# Patient Record
Sex: Male | Born: 1945 | Race: White | Hispanic: No | Smoking: Never smoker
Health system: Southern US, Community
[De-identification: ages and names within clinical notes are randomized; demographics above are authoritative.]

## PROBLEM LIST (undated history)

## (undated) DIAGNOSIS — R911 Solitary pulmonary nodule: Secondary | ICD-10-CM

## (undated) DIAGNOSIS — M7660 Achilles tendinitis, unspecified leg: Secondary | ICD-10-CM

## (undated) DIAGNOSIS — E559 Vitamin D deficiency, unspecified: Secondary | ICD-10-CM

## (undated) DIAGNOSIS — J45909 Unspecified asthma, uncomplicated: Secondary | ICD-10-CM

## (undated) DIAGNOSIS — C61 Malignant neoplasm of prostate: Secondary | ICD-10-CM

## (undated) DIAGNOSIS — G3184 Mild cognitive impairment, so stated: Secondary | ICD-10-CM

## (undated) DIAGNOSIS — K219 Gastro-esophageal reflux disease without esophagitis: Secondary | ICD-10-CM

## (undated) DIAGNOSIS — K5901 Slow transit constipation: Secondary | ICD-10-CM

## (undated) DIAGNOSIS — G4700 Insomnia, unspecified: Secondary | ICD-10-CM

## (undated) DIAGNOSIS — E785 Hyperlipidemia, unspecified: Secondary | ICD-10-CM

## (undated) DIAGNOSIS — G4730 Sleep apnea, unspecified: Secondary | ICD-10-CM

## (undated) DIAGNOSIS — E119 Type 2 diabetes mellitus without complications: Secondary | ICD-10-CM

## (undated) DIAGNOSIS — E663 Overweight: Secondary | ICD-10-CM

## (undated) DIAGNOSIS — M5030 Other cervical disc degeneration, unspecified cervical region: Secondary | ICD-10-CM

## (undated) DIAGNOSIS — K8020 Calculus of gallbladder without cholecystitis without obstruction: Secondary | ICD-10-CM

## (undated) DIAGNOSIS — M6528 Calcific tendinitis, other site: Secondary | ICD-10-CM

## (undated) HISTORY — DX: Unspecified asthma, uncomplicated: J45.909

## (undated) HISTORY — DX: Malignant neoplasm of prostate: C61

## (undated) HISTORY — DX: Gastro-esophageal reflux disease without esophagitis: K21.9

## (undated) HISTORY — PX: WISDOM TOOTH EXTRACTION: SHX21

## (undated) HISTORY — DX: Other cervical disc degeneration, unspecified cervical region: M50.30

## (undated) HISTORY — DX: Sleep apnea, unspecified: G47.30

## (undated) HISTORY — DX: Insomnia, unspecified: G47.00

## (undated) HISTORY — DX: Mild cognitive impairment of uncertain or unknown etiology: G31.84

## (undated) HISTORY — DX: Calcific tendinitis, other site: M65.28

## (undated) HISTORY — PX: CERVICAL FUSION: SHX112

## (undated) HISTORY — DX: Achilles tendinitis, unspecified leg: M76.60

## (undated) HISTORY — DX: Solitary pulmonary nodule: R91.1

## (undated) HISTORY — DX: Hyperlipidemia, unspecified: E78.5

## (undated) HISTORY — DX: Overweight: E66.3

## (undated) HISTORY — DX: Type 2 diabetes mellitus without complications: E11.9

## (undated) HISTORY — DX: Slow transit constipation: K59.01

## (undated) HISTORY — DX: Calculus of gallbladder without cholecystitis without obstruction: K80.20

## (undated) HISTORY — DX: Vitamin D deficiency, unspecified: E55.9

## (undated) HISTORY — PX: TONSILLECTOMY: SHX5217

---

## 1993-05-29 HISTORY — PX: OTHER SURGICAL HISTORY: SHX169

## 1995-05-30 HISTORY — PX: EYE SURGERY: SHX253

## 2004-06-29 DIAGNOSIS — R911 Solitary pulmonary nodule: Secondary | ICD-10-CM

## 2004-06-29 HISTORY — DX: Solitary pulmonary nodule: R91.1

## 2006-05-29 LAB — HM COLONOSCOPY

## 2009-10-04 DIAGNOSIS — E8881 Metabolic syndrome: Secondary | ICD-10-CM | POA: Insufficient documentation

## 2010-05-29 HISTORY — PX: CHOLECYSTECTOMY: SHX55

## 2011-09-11 DIAGNOSIS — K8020 Calculus of gallbladder without cholecystitis without obstruction: Secondary | ICD-10-CM

## 2011-09-11 HISTORY — DX: Calculus of gallbladder without cholecystitis without obstruction: K80.20

## 2011-09-11 HISTORY — PX: PROSTATE SURGERY: SHX751

## 2015-05-30 LAB — HM COLONOSCOPY

## 2015-06-16 DIAGNOSIS — C61 Malignant neoplasm of prostate: Secondary | ICD-10-CM | POA: Diagnosis not present

## 2015-06-22 DIAGNOSIS — N528 Other male erectile dysfunction: Secondary | ICD-10-CM | POA: Diagnosis not present

## 2015-06-22 DIAGNOSIS — C61 Malignant neoplasm of prostate: Secondary | ICD-10-CM | POA: Diagnosis not present

## 2015-07-01 DIAGNOSIS — M47812 Spondylosis without myelopathy or radiculopathy, cervical region: Secondary | ICD-10-CM | POA: Diagnosis not present

## 2015-07-20 DIAGNOSIS — M4312 Spondylolisthesis, cervical region: Secondary | ICD-10-CM | POA: Diagnosis not present

## 2015-07-20 DIAGNOSIS — M4802 Spinal stenosis, cervical region: Secondary | ICD-10-CM | POA: Diagnosis not present

## 2015-07-20 DIAGNOSIS — M47812 Spondylosis without myelopathy or radiculopathy, cervical region: Secondary | ICD-10-CM | POA: Diagnosis not present

## 2015-07-20 DIAGNOSIS — M5021 Other cervical disc displacement,  high cervical region: Secondary | ICD-10-CM | POA: Diagnosis not present

## 2015-07-22 DIAGNOSIS — M4802 Spinal stenosis, cervical region: Secondary | ICD-10-CM | POA: Diagnosis not present

## 2015-09-23 DIAGNOSIS — M9902 Segmental and somatic dysfunction of thoracic region: Secondary | ICD-10-CM | POA: Diagnosis not present

## 2015-09-23 DIAGNOSIS — M9903 Segmental and somatic dysfunction of lumbar region: Secondary | ICD-10-CM | POA: Diagnosis not present

## 2015-09-23 DIAGNOSIS — M9901 Segmental and somatic dysfunction of cervical region: Secondary | ICD-10-CM | POA: Diagnosis not present

## 2015-10-04 DIAGNOSIS — E559 Vitamin D deficiency, unspecified: Secondary | ICD-10-CM | POA: Diagnosis not present

## 2015-10-04 DIAGNOSIS — Z8546 Personal history of malignant neoplasm of prostate: Secondary | ICD-10-CM | POA: Diagnosis not present

## 2015-10-04 DIAGNOSIS — E119 Type 2 diabetes mellitus without complications: Secondary | ICD-10-CM | POA: Diagnosis not present

## 2015-10-11 DIAGNOSIS — Z8619 Personal history of other infectious and parasitic diseases: Secondary | ICD-10-CM | POA: Diagnosis not present

## 2015-10-18 DIAGNOSIS — E119 Type 2 diabetes mellitus without complications: Secondary | ICD-10-CM | POA: Diagnosis not present

## 2015-10-18 DIAGNOSIS — E785 Hyperlipidemia, unspecified: Secondary | ICD-10-CM | POA: Diagnosis not present

## 2015-10-18 DIAGNOSIS — Z0000 Encounter for general adult medical examination without abnormal findings: Secondary | ICD-10-CM | POA: Diagnosis not present

## 2015-10-18 DIAGNOSIS — E559 Vitamin D deficiency, unspecified: Secondary | ICD-10-CM | POA: Diagnosis not present

## 2015-10-21 DIAGNOSIS — M9903 Segmental and somatic dysfunction of lumbar region: Secondary | ICD-10-CM | POA: Diagnosis not present

## 2015-10-21 DIAGNOSIS — M9901 Segmental and somatic dysfunction of cervical region: Secondary | ICD-10-CM | POA: Diagnosis not present

## 2015-10-21 DIAGNOSIS — M9902 Segmental and somatic dysfunction of thoracic region: Secondary | ICD-10-CM | POA: Diagnosis not present

## 2015-10-26 DIAGNOSIS — E119 Type 2 diabetes mellitus without complications: Secondary | ICD-10-CM | POA: Diagnosis not present

## 2015-10-26 DIAGNOSIS — H2513 Age-related nuclear cataract, bilateral: Secondary | ICD-10-CM | POA: Diagnosis not present

## 2015-11-11 DIAGNOSIS — M9903 Segmental and somatic dysfunction of lumbar region: Secondary | ICD-10-CM | POA: Diagnosis not present

## 2015-11-11 DIAGNOSIS — M9901 Segmental and somatic dysfunction of cervical region: Secondary | ICD-10-CM | POA: Diagnosis not present

## 2015-11-11 DIAGNOSIS — M9902 Segmental and somatic dysfunction of thoracic region: Secondary | ICD-10-CM | POA: Diagnosis not present

## 2015-11-25 DIAGNOSIS — M9901 Segmental and somatic dysfunction of cervical region: Secondary | ICD-10-CM | POA: Diagnosis not present

## 2015-11-25 DIAGNOSIS — M9902 Segmental and somatic dysfunction of thoracic region: Secondary | ICD-10-CM | POA: Diagnosis not present

## 2015-11-25 DIAGNOSIS — M9903 Segmental and somatic dysfunction of lumbar region: Secondary | ICD-10-CM | POA: Diagnosis not present

## 2015-12-14 DIAGNOSIS — M4802 Spinal stenosis, cervical region: Secondary | ICD-10-CM | POA: Diagnosis not present

## 2015-12-16 DIAGNOSIS — M4802 Spinal stenosis, cervical region: Secondary | ICD-10-CM | POA: Diagnosis not present

## 2015-12-17 DIAGNOSIS — Z86010 Personal history of colonic polyps: Secondary | ICD-10-CM | POA: Diagnosis not present

## 2015-12-17 DIAGNOSIS — Z01818 Encounter for other preprocedural examination: Secondary | ICD-10-CM | POA: Diagnosis not present

## 2015-12-17 DIAGNOSIS — G4730 Sleep apnea, unspecified: Secondary | ICD-10-CM | POA: Diagnosis not present

## 2015-12-17 DIAGNOSIS — Z8546 Personal history of malignant neoplasm of prostate: Secondary | ICD-10-CM | POA: Diagnosis not present

## 2015-12-17 DIAGNOSIS — J45909 Unspecified asthma, uncomplicated: Secondary | ICD-10-CM | POA: Diagnosis not present

## 2015-12-21 DIAGNOSIS — M4802 Spinal stenosis, cervical region: Secondary | ICD-10-CM | POA: Diagnosis not present

## 2015-12-22 DIAGNOSIS — C61 Malignant neoplasm of prostate: Secondary | ICD-10-CM | POA: Diagnosis not present

## 2015-12-24 DIAGNOSIS — M4802 Spinal stenosis, cervical region: Secondary | ICD-10-CM | POA: Diagnosis not present

## 2015-12-28 HISTORY — PX: COLONOSCOPY: SHX174

## 2016-01-03 DIAGNOSIS — C61 Malignant neoplasm of prostate: Secondary | ICD-10-CM | POA: Diagnosis not present

## 2016-01-06 DIAGNOSIS — M9901 Segmental and somatic dysfunction of cervical region: Secondary | ICD-10-CM | POA: Diagnosis not present

## 2016-01-06 DIAGNOSIS — M9902 Segmental and somatic dysfunction of thoracic region: Secondary | ICD-10-CM | POA: Diagnosis not present

## 2016-01-06 DIAGNOSIS — M9903 Segmental and somatic dysfunction of lumbar region: Secondary | ICD-10-CM | POA: Diagnosis not present

## 2016-01-18 DIAGNOSIS — Z1211 Encounter for screening for malignant neoplasm of colon: Secondary | ICD-10-CM | POA: Diagnosis not present

## 2016-01-18 DIAGNOSIS — Z86010 Personal history of colonic polyps: Secondary | ICD-10-CM | POA: Diagnosis not present

## 2016-01-27 DIAGNOSIS — M9903 Segmental and somatic dysfunction of lumbar region: Secondary | ICD-10-CM | POA: Diagnosis not present

## 2016-01-27 DIAGNOSIS — M9901 Segmental and somatic dysfunction of cervical region: Secondary | ICD-10-CM | POA: Diagnosis not present

## 2016-01-27 DIAGNOSIS — M9902 Segmental and somatic dysfunction of thoracic region: Secondary | ICD-10-CM | POA: Diagnosis not present

## 2016-02-17 DIAGNOSIS — M9902 Segmental and somatic dysfunction of thoracic region: Secondary | ICD-10-CM | POA: Diagnosis not present

## 2016-02-17 DIAGNOSIS — M9901 Segmental and somatic dysfunction of cervical region: Secondary | ICD-10-CM | POA: Diagnosis not present

## 2016-02-17 DIAGNOSIS — M9903 Segmental and somatic dysfunction of lumbar region: Secondary | ICD-10-CM | POA: Diagnosis not present

## 2016-02-21 DIAGNOSIS — G4733 Obstructive sleep apnea (adult) (pediatric): Secondary | ICD-10-CM | POA: Diagnosis not present

## 2016-02-23 DIAGNOSIS — E162 Hypoglycemia, unspecified: Secondary | ICD-10-CM | POA: Diagnosis not present

## 2016-02-23 DIAGNOSIS — E119 Type 2 diabetes mellitus without complications: Secondary | ICD-10-CM | POA: Diagnosis not present

## 2016-02-23 DIAGNOSIS — H8113 Benign paroxysmal vertigo, bilateral: Secondary | ICD-10-CM | POA: Diagnosis not present

## 2016-03-02 DIAGNOSIS — Z23 Encounter for immunization: Secondary | ICD-10-CM | POA: Diagnosis not present

## 2016-03-09 DIAGNOSIS — M9901 Segmental and somatic dysfunction of cervical region: Secondary | ICD-10-CM | POA: Diagnosis not present

## 2016-03-09 DIAGNOSIS — M9902 Segmental and somatic dysfunction of thoracic region: Secondary | ICD-10-CM | POA: Diagnosis not present

## 2016-03-09 DIAGNOSIS — M9903 Segmental and somatic dysfunction of lumbar region: Secondary | ICD-10-CM | POA: Diagnosis not present

## 2016-03-22 DIAGNOSIS — J45909 Unspecified asthma, uncomplicated: Secondary | ICD-10-CM | POA: Diagnosis not present

## 2016-03-22 DIAGNOSIS — R05 Cough: Secondary | ICD-10-CM | POA: Diagnosis not present

## 2016-04-06 DIAGNOSIS — M9902 Segmental and somatic dysfunction of thoracic region: Secondary | ICD-10-CM | POA: Diagnosis not present

## 2016-04-06 DIAGNOSIS — M9903 Segmental and somatic dysfunction of lumbar region: Secondary | ICD-10-CM | POA: Diagnosis not present

## 2016-04-06 DIAGNOSIS — M9901 Segmental and somatic dysfunction of cervical region: Secondary | ICD-10-CM | POA: Diagnosis not present

## 2016-04-11 DIAGNOSIS — E119 Type 2 diabetes mellitus without complications: Secondary | ICD-10-CM | POA: Diagnosis not present

## 2016-04-11 DIAGNOSIS — E782 Mixed hyperlipidemia: Secondary | ICD-10-CM | POA: Diagnosis not present

## 2016-04-26 DIAGNOSIS — E782 Mixed hyperlipidemia: Secondary | ICD-10-CM | POA: Diagnosis not present

## 2016-04-26 DIAGNOSIS — J45909 Unspecified asthma, uncomplicated: Secondary | ICD-10-CM | POA: Diagnosis not present

## 2016-04-26 DIAGNOSIS — K219 Gastro-esophageal reflux disease without esophagitis: Secondary | ICD-10-CM | POA: Diagnosis not present

## 2016-04-26 DIAGNOSIS — E119 Type 2 diabetes mellitus without complications: Secondary | ICD-10-CM | POA: Diagnosis not present

## 2016-05-01 DIAGNOSIS — E873 Alkalosis: Secondary | ICD-10-CM | POA: Diagnosis not present

## 2016-05-01 DIAGNOSIS — J189 Pneumonia, unspecified organism: Secondary | ICD-10-CM | POA: Diagnosis not present

## 2016-05-01 DIAGNOSIS — R509 Fever, unspecified: Secondary | ICD-10-CM | POA: Diagnosis not present

## 2016-05-01 DIAGNOSIS — Z7984 Long term (current) use of oral hypoglycemic drugs: Secondary | ICD-10-CM | POA: Diagnosis not present

## 2016-05-01 DIAGNOSIS — E119 Type 2 diabetes mellitus without complications: Secondary | ICD-10-CM | POA: Diagnosis not present

## 2016-05-01 DIAGNOSIS — J440 Chronic obstructive pulmonary disease with acute lower respiratory infection: Secondary | ICD-10-CM | POA: Diagnosis not present

## 2016-05-09 DIAGNOSIS — J189 Pneumonia, unspecified organism: Secondary | ICD-10-CM | POA: Diagnosis not present

## 2016-05-25 DIAGNOSIS — M9902 Segmental and somatic dysfunction of thoracic region: Secondary | ICD-10-CM | POA: Diagnosis not present

## 2016-05-25 DIAGNOSIS — M9901 Segmental and somatic dysfunction of cervical region: Secondary | ICD-10-CM | POA: Diagnosis not present

## 2016-05-25 DIAGNOSIS — M9903 Segmental and somatic dysfunction of lumbar region: Secondary | ICD-10-CM | POA: Diagnosis not present

## 2016-07-06 DIAGNOSIS — M9903 Segmental and somatic dysfunction of lumbar region: Secondary | ICD-10-CM | POA: Diagnosis not present

## 2016-07-06 DIAGNOSIS — M9901 Segmental and somatic dysfunction of cervical region: Secondary | ICD-10-CM | POA: Diagnosis not present

## 2016-07-06 DIAGNOSIS — M9902 Segmental and somatic dysfunction of thoracic region: Secondary | ICD-10-CM | POA: Diagnosis not present

## 2016-07-27 DIAGNOSIS — M9902 Segmental and somatic dysfunction of thoracic region: Secondary | ICD-10-CM | POA: Diagnosis not present

## 2016-07-27 DIAGNOSIS — M9901 Segmental and somatic dysfunction of cervical region: Secondary | ICD-10-CM | POA: Diagnosis not present

## 2016-07-27 DIAGNOSIS — M9903 Segmental and somatic dysfunction of lumbar region: Secondary | ICD-10-CM | POA: Diagnosis not present

## 2016-08-18 DIAGNOSIS — R0602 Shortness of breath: Secondary | ICD-10-CM | POA: Diagnosis not present

## 2016-08-18 DIAGNOSIS — J189 Pneumonia, unspecified organism: Secondary | ICD-10-CM | POA: Diagnosis not present

## 2016-08-21 DIAGNOSIS — J45909 Unspecified asthma, uncomplicated: Secondary | ICD-10-CM | POA: Diagnosis not present

## 2016-08-21 DIAGNOSIS — J189 Pneumonia, unspecified organism: Secondary | ICD-10-CM | POA: Diagnosis not present

## 2016-10-27 LAB — HM DIABETES EYE EXAM

## 2016-11-07 DIAGNOSIS — E119 Type 2 diabetes mellitus without complications: Secondary | ICD-10-CM | POA: Diagnosis not present

## 2016-11-07 DIAGNOSIS — E782 Mixed hyperlipidemia: Secondary | ICD-10-CM | POA: Diagnosis not present

## 2016-11-07 DIAGNOSIS — E559 Vitamin D deficiency, unspecified: Secondary | ICD-10-CM | POA: Diagnosis not present

## 2016-11-07 DIAGNOSIS — Z0000 Encounter for general adult medical examination without abnormal findings: Secondary | ICD-10-CM | POA: Diagnosis not present

## 2016-11-07 LAB — HEPATIC FUNCTION PANEL
ALT: 20 (ref 10–40)
AST: 20 (ref 14–40)
Alkaline Phosphatase: 47 (ref 25–125)
BILIRUBIN, TOTAL: 0.7

## 2016-11-07 LAB — LIPID PANEL
Cholesterol: 148 (ref 0–200)
HDL: 84 — AB (ref 35–70)
LDL CALC: 70
TRIGLYCERIDES: 70 (ref 40–160)

## 2016-11-07 LAB — PSA: PSA: 0.769

## 2016-11-07 LAB — CBC AND DIFFERENTIAL
HCT: 45 (ref 41–53)
Hemoglobin: 14.6 (ref 13.5–17.5)
Neutrophils Absolute: 3
PLATELETS: 174 (ref 150–399)
WBC: 4.4

## 2016-11-07 LAB — VITAMIN D 25 HYDROXY (VIT D DEFICIENCY, FRACTURES): Vit D, 25-Hydroxy: 35.5

## 2016-11-07 LAB — BASIC METABOLIC PANEL
BUN: 16 (ref 4–21)
Creatinine: 1.1 (ref 0.6–1.3)
GLUCOSE: 143
Potassium: 4.6 (ref 3.4–5.3)
Sodium: 136 — AB (ref 137–147)

## 2016-11-07 LAB — TSH: TSH: 2.4 (ref 0.41–5.90)

## 2016-11-07 LAB — HEMOGLOBIN A1C: Hemoglobin A1C: 6.1

## 2016-11-16 DIAGNOSIS — M9901 Segmental and somatic dysfunction of cervical region: Secondary | ICD-10-CM | POA: Diagnosis not present

## 2016-11-16 DIAGNOSIS — M9903 Segmental and somatic dysfunction of lumbar region: Secondary | ICD-10-CM | POA: Diagnosis not present

## 2016-11-16 DIAGNOSIS — M9902 Segmental and somatic dysfunction of thoracic region: Secondary | ICD-10-CM | POA: Diagnosis not present

## 2016-11-27 DIAGNOSIS — E119 Type 2 diabetes mellitus without complications: Secondary | ICD-10-CM | POA: Diagnosis not present

## 2016-11-27 DIAGNOSIS — E559 Vitamin D deficiency, unspecified: Secondary | ICD-10-CM | POA: Diagnosis not present

## 2016-11-27 DIAGNOSIS — Z0000 Encounter for general adult medical examination without abnormal findings: Secondary | ICD-10-CM | POA: Diagnosis not present

## 2016-11-27 DIAGNOSIS — E782 Mixed hyperlipidemia: Secondary | ICD-10-CM | POA: Diagnosis not present

## 2016-12-07 DIAGNOSIS — M9901 Segmental and somatic dysfunction of cervical region: Secondary | ICD-10-CM | POA: Diagnosis not present

## 2016-12-07 DIAGNOSIS — M9902 Segmental and somatic dysfunction of thoracic region: Secondary | ICD-10-CM | POA: Diagnosis not present

## 2016-12-07 DIAGNOSIS — M9903 Segmental and somatic dysfunction of lumbar region: Secondary | ICD-10-CM | POA: Diagnosis not present

## 2016-12-26 DIAGNOSIS — M9901 Segmental and somatic dysfunction of cervical region: Secondary | ICD-10-CM | POA: Diagnosis not present

## 2016-12-26 DIAGNOSIS — M9903 Segmental and somatic dysfunction of lumbar region: Secondary | ICD-10-CM | POA: Diagnosis not present

## 2016-12-26 DIAGNOSIS — M9902 Segmental and somatic dysfunction of thoracic region: Secondary | ICD-10-CM | POA: Diagnosis not present

## 2017-01-03 DIAGNOSIS — C61 Malignant neoplasm of prostate: Secondary | ICD-10-CM | POA: Diagnosis not present

## 2017-01-08 DIAGNOSIS — N528 Other male erectile dysfunction: Secondary | ICD-10-CM | POA: Diagnosis not present

## 2017-01-08 DIAGNOSIS — C61 Malignant neoplasm of prostate: Secondary | ICD-10-CM | POA: Diagnosis not present

## 2017-01-16 DIAGNOSIS — M9901 Segmental and somatic dysfunction of cervical region: Secondary | ICD-10-CM | POA: Diagnosis not present

## 2017-01-16 DIAGNOSIS — M9903 Segmental and somatic dysfunction of lumbar region: Secondary | ICD-10-CM | POA: Diagnosis not present

## 2017-01-16 DIAGNOSIS — M9902 Segmental and somatic dysfunction of thoracic region: Secondary | ICD-10-CM | POA: Diagnosis not present

## 2017-02-06 DIAGNOSIS — M9901 Segmental and somatic dysfunction of cervical region: Secondary | ICD-10-CM | POA: Diagnosis not present

## 2017-02-06 DIAGNOSIS — M9902 Segmental and somatic dysfunction of thoracic region: Secondary | ICD-10-CM | POA: Diagnosis not present

## 2017-02-06 DIAGNOSIS — M9903 Segmental and somatic dysfunction of lumbar region: Secondary | ICD-10-CM | POA: Diagnosis not present

## 2017-02-19 DIAGNOSIS — G4733 Obstructive sleep apnea (adult) (pediatric): Secondary | ICD-10-CM | POA: Diagnosis not present

## 2017-02-27 DIAGNOSIS — M9901 Segmental and somatic dysfunction of cervical region: Secondary | ICD-10-CM | POA: Diagnosis not present

## 2017-02-27 DIAGNOSIS — M9902 Segmental and somatic dysfunction of thoracic region: Secondary | ICD-10-CM | POA: Diagnosis not present

## 2017-02-27 DIAGNOSIS — M9903 Segmental and somatic dysfunction of lumbar region: Secondary | ICD-10-CM | POA: Diagnosis not present

## 2017-03-20 DIAGNOSIS — M9901 Segmental and somatic dysfunction of cervical region: Secondary | ICD-10-CM | POA: Diagnosis not present

## 2017-03-20 DIAGNOSIS — M9902 Segmental and somatic dysfunction of thoracic region: Secondary | ICD-10-CM | POA: Diagnosis not present

## 2017-03-20 DIAGNOSIS — M9903 Segmental and somatic dysfunction of lumbar region: Secondary | ICD-10-CM | POA: Diagnosis not present

## 2017-04-17 DIAGNOSIS — J45909 Unspecified asthma, uncomplicated: Secondary | ICD-10-CM | POA: Diagnosis not present

## 2017-04-17 DIAGNOSIS — R05 Cough: Secondary | ICD-10-CM | POA: Diagnosis not present

## 2017-05-16 DIAGNOSIS — C61 Malignant neoplasm of prostate: Secondary | ICD-10-CM | POA: Diagnosis not present

## 2017-06-08 DIAGNOSIS — N529 Male erectile dysfunction, unspecified: Secondary | ICD-10-CM | POA: Diagnosis not present

## 2017-06-08 DIAGNOSIS — C61 Malignant neoplasm of prostate: Secondary | ICD-10-CM | POA: Diagnosis not present

## 2017-06-08 DIAGNOSIS — M9901 Segmental and somatic dysfunction of cervical region: Secondary | ICD-10-CM | POA: Diagnosis not present

## 2017-06-08 DIAGNOSIS — M9902 Segmental and somatic dysfunction of thoracic region: Secondary | ICD-10-CM | POA: Diagnosis not present

## 2017-06-08 DIAGNOSIS — M9903 Segmental and somatic dysfunction of lumbar region: Secondary | ICD-10-CM | POA: Diagnosis not present

## 2017-06-29 DIAGNOSIS — M9902 Segmental and somatic dysfunction of thoracic region: Secondary | ICD-10-CM | POA: Diagnosis not present

## 2017-06-29 DIAGNOSIS — M9901 Segmental and somatic dysfunction of cervical region: Secondary | ICD-10-CM | POA: Diagnosis not present

## 2017-06-29 DIAGNOSIS — M9903 Segmental and somatic dysfunction of lumbar region: Secondary | ICD-10-CM | POA: Diagnosis not present

## 2017-07-17 ENCOUNTER — Encounter: Payer: Self-pay | Admitting: Internal Medicine

## 2017-08-08 ENCOUNTER — Non-Acute Institutional Stay: Payer: Medicare Other | Admitting: Internal Medicine

## 2017-08-08 ENCOUNTER — Encounter: Payer: Self-pay | Admitting: Internal Medicine

## 2017-08-08 VITALS — BP 122/68 | HR 64 | Temp 98.1°F | Ht 71.5 in | Wt 202.0 lb

## 2017-08-08 DIAGNOSIS — Z8546 Personal history of malignant neoplasm of prostate: Secondary | ICD-10-CM

## 2017-08-08 DIAGNOSIS — N5234 Erectile dysfunction following simple prostatectomy: Secondary | ICD-10-CM

## 2017-08-08 DIAGNOSIS — K5901 Slow transit constipation: Secondary | ICD-10-CM | POA: Diagnosis not present

## 2017-08-08 DIAGNOSIS — J4520 Mild intermittent asthma, uncomplicated: Secondary | ICD-10-CM

## 2017-08-08 DIAGNOSIS — E119 Type 2 diabetes mellitus without complications: Secondary | ICD-10-CM

## 2017-08-08 DIAGNOSIS — G4733 Obstructive sleep apnea (adult) (pediatric): Secondary | ICD-10-CM | POA: Insufficient documentation

## 2017-08-08 DIAGNOSIS — M5030 Other cervical disc degeneration, unspecified cervical region: Secondary | ICD-10-CM | POA: Diagnosis not present

## 2017-08-08 DIAGNOSIS — K219 Gastro-esophageal reflux disease without esophagitis: Secondary | ICD-10-CM

## 2017-08-08 DIAGNOSIS — E782 Mixed hyperlipidemia: Secondary | ICD-10-CM | POA: Diagnosis not present

## 2017-08-08 DIAGNOSIS — J45909 Unspecified asthma, uncomplicated: Secondary | ICD-10-CM

## 2017-08-08 DIAGNOSIS — E663 Overweight: Secondary | ICD-10-CM | POA: Diagnosis not present

## 2017-08-08 DIAGNOSIS — Z9989 Dependence on other enabling machines and devices: Secondary | ICD-10-CM | POA: Insufficient documentation

## 2017-08-08 DIAGNOSIS — E559 Vitamin D deficiency, unspecified: Secondary | ICD-10-CM | POA: Diagnosis not present

## 2017-08-08 DIAGNOSIS — G4730 Sleep apnea, unspecified: Secondary | ICD-10-CM

## 2017-08-08 NOTE — Progress Notes (Signed)
Provider:  Rexene Edison. Mariea Clonts, D.O., C.M.D. Location:  Occupational psychologist of Service:  Clinic (12)  Previous PCP:Dr. Ralene Muskrat, [place], [place]    Patient Care Team: [name], DO as PCP - General (Geriatric Medicine)  Extended Emergency Contact Information Primary Emergency Contact: Cecelia Byars Address: [address]          [address] [place], [place] [address] Johnnette Litter of [employer] Phone: [phone] Relation: Son  Code Status: full code Goals of Care: Advanced Directive information Advanced Directives 08/08/2017  Does Patient Have a Medical Advance Directive? Yes  Type of Advance Directive [place]  Does patient want to make changes to medical advance directive? No - Patient declined  Copy of [place] in Chart? Yes    Chief Complaint  Patient presents with  . Establish Care    new patient at [place]    HPI: Patient is a 72 y.o. male seen today to establish with [facility].  Records have been requested from previous PCP in [place].  He's lived here at [facility] since December.  Needs an annual physical this summer.  Has seen Dr. Manus Rudd internal medicine since 1987.    Has had some health concerns:    Has been diabetic and has gained weight since moving here.  He is walking more and cut down on his dessert intake to one per week now.  Says chocolate remains a food group.  No longer has a continuous sweet craving.  He learned to cook when taking care of his wife and began cooking healthy veggies and less carbs--lost a bunch of weight.  He's on just one metformin a day now.  Last diabetic eye exam was 10/27/16 negative for retinopathy.  Last foot exam was 11/27/16 at annual exam.  TSH was normal on his labs last year which he asked about.    After his prostate cancer surgery, he had erectile dysfunction.  He had no other treatments for his prostate cancer outside of the surgery.   He is wondering about his hormones.  He has a prescription for cialis--can function to some degree with that.  Also needs a urologist due to prostate cancer history.  Initial diagnosis was 2006 and surgery.  He has a girlfriend for the past 6 months and wants things to be in working order.    He had been followed by pulmonologist for asthma.  He was not convinced he truly had asthma, but his medicines seemed to work.  Does take breo and also regularly uses albuterol bid due to cough moreso than wheezing.  Uses one nasal spray on each side b/c two causes nose bleeds.  Discussed proper spray technique and capillaries on nasal septum.  He also reports having had 2 episodes of pneumonia in the past.    His wife passed away from colon cancer in 08/2016.  His wife had designed their home in [place] and he had no need to be in a big house alone.  He got tired of cooking the same thing for himself for days at a time.  His aunt is at [facility].  Met a lovely lady 6 months ago who lives in [place] is traveling a lot.    He has places on his scalp for which he puts ointments on them and he feels he needs to see dermatology also.  Does plan to see  the dermatology group that comes here to [facility].  He's a youngster here and he wants to stick around.  He is traveling a lot.  Walks regularly.  He worked in Agricultural consultant.    Takes coQ10 with his statin therapy for cholesterol.  Has had mixed hyperlipidemia upon lab review.    Forgets his b12 often, but not known to have b12 deficiency.  Was just told to take this.    Takes vitamin D every other week and did have deficiency.  Was on weekly dosing at one point.  He's faithful about this.  Tries to walk a lot, but can't do much else due to his neck surgery.  Will walk around [facility] twice.    Has valtrex for herpes outbreaks which may occur every couple of years.  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  Was minor concern of his b/c his grandfather had some degree of dementia as did his uncle who lost his memory in his 24s.  His previous doctor was not concerned.    Past Medical History:  Diagnosis Date  . Asthma   . Calcific Achilles tendonitis    bilateral  . Controlled type 2 diabetes mellitus without complication ([place])   . DDD (degenerative disc disease), cervical   . Gallstone 09/11/2011  . GERD (gastroesophageal reflux disease)   . Hyperlipidemia    mixed  . Insomnia   . Mild cognitive impairment   . Overweight (BMI 25.0-29.9)   . Prostate cancer ([place])   . Pulmonary nodule/lesion, solitary 06/2004   f/u in 2014, 2015 stable in [facility]  . Sleep apnea   . Slow transit constipation   . Vitamin D deficiency    Past Surgical History:  Procedure Laterality Date  . CERVICAL FUSION  1999 & 2004   implant metal plate  . EYE SURGERY  1997   laser vision correction  . GALLBLADDER SURGERY  2012  . palatouvulopasty  1995  . PROSTATE SURGERY  09/11/2011   prostatectomy for prostate cancer     Social History   Socioeconomic History  . Marital status: Widowed    Spouse name: None  . Number of children: None  . Years of education: None  . Highest education level: None  Social Needs  . Financial resource strain: Not hard at all  . Food insecurity - worry: None  . Food insecurity - inability: None  . Transportation needs - medical: No  . Transportation needs - non-medical: No  Occupational History  . Occupation: accounting    Comment: Agricultural consultant  Tobacco Use  . Smoking status: Never Smoker  . Smokeless tobacco: Never Used  Substance and Sexual Activity  . Alcohol use: Yes  . Drug use: No  . Sexual activity: Yes  Other Topics Concern  . None  Social History Narrative   Tobacco use, amount per day now: NEVER USED   Past tobacco use, amount per day: NEVER USED   How many years did you use tobacco: NONE   Alcohol use (drinks per  week): 10 GLASSES OF WINE A WEEK   Diet:   Do you drink/eat things with caffeine: YES   Marital status:  WIDOWED                    What year were you married?   Do you live in a house, apartment, assisted living, condo, trailer, etc.? APARTMENT  Is it one or more stories? ONE STORY   How many persons live in your home? NONE   Do you have pets in your home?( please list) NO   Current or past profession: FINANCIAL   Do you exercise?  YES                                Type and how often? WALKING 1-2 HOURS   Do you have a living will? YES   Do you have a DNR form?    NO                               If not, do you want to discuss one?   Do you have signed POA/HPOA forms?   YES                     If so, please bring to you appointment    reports that  has never smoked. he has never used smokeless tobacco. He reports that he drinks alcohol. He reports that he does not use drugs.  Functional Status Survey: Is the patient deaf or have difficulty hearing?: Yes(hearing well with hearing aids) Does the patient have difficulty seeing, even when wearing glasses/contacts?: No Does the patient have difficulty concentrating, remembering, or making decisions?: No Does the patient have difficulty walking or climbing stairs?: No Does the patient have difficulty dressing or bathing?: No Does the patient have difficulty doing errands alone such as visiting a doctor's office or shopping?: No  Family History  Problem Relation Age of Onset  . Cancer Mother   . Heart disease Father   . Heart disease Brother   . Diabetes Brother     Health Maintenance  Topic Date Due  . Hepatitis C Screening  29-Aug-1945  . URINE MICROALBUMIN  07/17/1955  . HEMOGLOBIN A1C  05/09/2017  . PNA vac Low Risk Adult (2 of 2 - PPSV23) 08/04/2017  . OPHTHALMOLOGY EXAM  10/27/2017  . FOOT EXAM  11/27/2017  . COLONOSCOPY  05/29/2025  . TETANUS/TDAP  05/07/2027  . INFLUENZA VACCINE  Completed    Allergies  Allergen  Reactions  . Tetracyclines & Related Other (See Comments)    hepatitis  . Mucinex [Guaifenesin Er] Other (See Comments)    Irritability, nausea  . Niacin And Related Other (See Comments)    headache  . Wellbutrin [Bupropion] Other (See Comments)    dizziness    Outpatient Encounter Medications as of 08/08/2017  Medication Sig  . albuterol (PROVENTIL HFA;VENTOLIN HFA) 108 (90 Base) MCG/ACT inhaler Inhale 1 puff into the lungs 2 (two) times daily.  . Coenzyme Q10 (CO Q 10) 100 MG CAPS Take 2 capsules by mouth daily.  . fluticasone (FLONASE) 50 MCG/ACT nasal spray Place 1 spray into both nostrils as needed for allergies or rhinitis.  . fluticasone furoate-vilanterol (BREO ELLIPTA) 100-25 MCG/INH AEPB Inhale 1 puff into the lungs daily.  . metFORMIN (GLUCOPHAGE) 500 MG tablet Take 500 mg by mouth daily with breakfast.  . polyethylene glycol (MIRALAX / GLYCOLAX) packet Take 17 g by mouth daily.  . simvastatin (ZOCOR) 40 MG tablet Take 40 mg by mouth daily.  . tadalafil (CIALIS) 20 MG tablet Take 20 mg by mouth daily as needed for erectile dysfunction.  . valACYclovir (VALTREX) 1000 MG tablet Take 1,000 mg by mouth as needed.  Marland Kitchen  vitamin B-12 (CYANOCOBALAMIN) 500 MCG tablet Take 500 mcg by mouth daily.  . Vitamin D, Ergocalciferol, (DRISDOL) 50000 units CAPS capsule Take 50,000 Units by mouth every 14 (fourteen) days.   No facility-administered encounter medications on file as of 08/08/2017.     Review of Systems  Constitutional: Negative for chills, fever and malaise/fatigue.  HENT: Positive for congestion and hearing loss.        Wears bilateral hearing aids that work well for him  Eyes: Negative for blurred vision.       Nearsighted and wears reading glasses; had prior lasik which worked well  Respiratory: Positive for cough. Negative for shortness of breath and wheezing.        H/o sleep apnea, we did not discuss it today  Cardiovascular: Negative for chest pain, palpitations and leg  swelling.  Gastrointestinal: Positive for constipation. Negative for abdominal pain, blood in stool, diarrhea and melena.  Genitourinary: Negative for dysuria.  Musculoskeletal: Negative for falls and joint pain.  Skin: Negative for itching and rash.  Neurological: Negative for dizziness, loss of consciousness and weakness.       Reports balance is not as good and attributes to his neck surgeries  Endo/Heme/Allergies: Does not bruise/bleed easily.  Psychiatric/Behavioral: Positive for memory loss. Negative for depression. The patient is not nervous/anxious and does not have insomnia.        He reports some memory loss of the analytical facts he once remembered but has not been felt to have dementia or significant cognitive loss     Vitals:   08/08/17 1023  BP: 122/68  Pulse: 64  Temp: 98.1 F (36.7 C)  TempSrc: Oral  SpO2: 95%  Weight: 202 lb (91.6 kg)  Height: 5' 11.5" (1.816 m)   Body mass index is 27.78 kg/m. Physical Exam  Constitutional: He is oriented to person, place, and time. He appears well-developed and well-nourished. No distress.  HENT:  Head: Normocephalic and atraumatic.  Right Ear: External ear normal.  Left Ear: External ear normal.  Nose: Nose normal.  Bilateral hearing aids  Eyes: EOM are normal. Pupils are equal, round, and reactive to light.  Neck: Neck supple. No JVD present.  Cardiovascular: Normal rate, regular rhythm, normal heart sounds and intact distal pulses. Exam reveals no gallop and no friction rub.  No murmur heard. Pulmonary/Chest: Effort normal and breath sounds normal. No respiratory distress.  Abdominal: Bowel sounds are normal.  Musculoskeletal: Normal range of motion.  Neurological: He is alert and oriented to person, place, and time.  Skin: Skin is warm and dry. Capillary refill takes less than 2 seconds.  Psychiatric: He has a normal mood and affect.    Labs reviewed: Basic Metabolic Panel: Recent Labs    11/07/16  NA 136*  K  4.6  BUN 16  CREATININE 1.1   Liver Function Tests: Recent Labs    11/07/16  AST 20  ALT 20  ALKPHOS 47   No results for input(s): LIPASE, AMYLASE in the last 8760 hours. No results for input(s): AMMONIA in the last 8760 hours. CBC: Recent Labs    11/07/16  WBC 4.4  NEUTROABS 3  HGB 14.6  HCT 45  PLT 174   Lab Results  Component Value Date   HGBA1C 6.1 11/07/2016   Lab Results  Component Value Date   TSH 2.40 11/07/2016   Imaging and Procedures noted on new patient packet: 2008 colonoscopy 2013 bone and ct scan 2017 colonoscopy  Assessment/Plan 1. Controlled type 2  diabetes mellitus without complication, without long-term current use of insulin ([place]) -f/u hba1c due to weight gain and increased dessert intake here at [facility]  -cont metformin -check urine microalbumin -will do foot exam at annual in July -will need wellness also with Sara  2. DDD (degenerative disc disease), cervical -s/p 2 surgeries, has had some gait imbalance since--none visible at visit--will keep an eye on this  3. Gastroesophageal reflux disease without esophagitis -historically, but not on regular medication for this (?role in cough)  4. Mixed hyperlipidemia -ongoing, on zocor '40mg'$ , will obtain labs before CPE in July  5. Overweight (BMI 25.0-29.9) -encouraged his walking regimen and cutting down on sweets which he reports already starting to do  6. Sleep apnea, unspecified type -did not address today, but was noted in records--need to verify if using cpap?  7. Slow transit constipation -cont miralax daily which has been effective, hydrate well  8. Vitamin D deficiency -cont biweekly vitamin D2 supplement  9. Mild intermittent asthma without complication -will arrange referral to pulmonary as pt reports his diagnosis is uncertain -cont breo and albuterol for now -seems he has cough-variant if asthma is even why he coughs  10. Erectile dysfunction following simple  prostatectomy -benefiting from cialis, is interested in hormone testing, but want to see urology anyway so advised to ask them about this at that time  11. H/o prostate cancer -will refer to [facility] to follow this, was in 2006 and underwent surgical resection, no other tx  Labs/tests ordered:  Cbc, cmp, flp, hba1c,  Urine microalbumin; pt plans to schedule appt with derm, urology, pulmonary and requested names which were provided in AVS  Jakhiya Brower L. Vee Bahe, D.O. [facility] [address], [place] [address] Cell Phone (Mon-Fri 8am-5pm):  [phone] On Call:  [phone] & follow prompts after 5pm & weekends Office Phone:  [phone] Office Fax:  [phone]

## 2017-08-08 NOTE — Patient Instructions (Addendum)
[facility], Dr. Melvyn Novas, et al for "asthma" [facility] for prostate  Dermatology here for places on your scalp

## 2017-09-11 DIAGNOSIS — L814 Other melanin hyperpigmentation: Secondary | ICD-10-CM | POA: Diagnosis not present

## 2017-09-11 DIAGNOSIS — L821 Other seborrheic keratosis: Secondary | ICD-10-CM | POA: Diagnosis not present

## 2017-09-11 DIAGNOSIS — L570 Actinic keratosis: Secondary | ICD-10-CM | POA: Diagnosis not present

## 2017-09-12 ENCOUNTER — Other Ambulatory Visit: Payer: Self-pay

## 2017-09-12 ENCOUNTER — Observation Stay (HOSPITAL_COMMUNITY)
Admission: EM | Admit: 2017-09-12 | Discharge: 2017-09-13 | Disposition: A | Discharge status: Home / Self Care | Payer: Medicare Other | Attending: Internal Medicine | Admitting: Internal Medicine

## 2017-09-12 ENCOUNTER — Encounter (HOSPITAL_COMMUNITY): Payer: Self-pay

## 2017-09-12 ENCOUNTER — Emergency Department (HOSPITAL_COMMUNITY): Payer: Medicare Other

## 2017-09-12 DIAGNOSIS — K219 Gastro-esophageal reflux disease without esophagitis: Secondary | ICD-10-CM | POA: Diagnosis not present

## 2017-09-12 DIAGNOSIS — Z8546 Personal history of malignant neoplasm of prostate: Secondary | ICD-10-CM | POA: Diagnosis not present

## 2017-09-12 DIAGNOSIS — R112 Nausea with vomiting, unspecified: Secondary | ICD-10-CM | POA: Diagnosis not present

## 2017-09-12 DIAGNOSIS — E559 Vitamin D deficiency, unspecified: Secondary | ICD-10-CM | POA: Diagnosis not present

## 2017-09-12 DIAGNOSIS — E785 Hyperlipidemia, unspecified: Secondary | ICD-10-CM | POA: Diagnosis not present

## 2017-09-12 DIAGNOSIS — G4700 Insomnia, unspecified: Secondary | ICD-10-CM | POA: Diagnosis not present

## 2017-09-12 DIAGNOSIS — Z7984 Long term (current) use of oral hypoglycemic drugs: Secondary | ICD-10-CM | POA: Insufficient documentation

## 2017-09-12 DIAGNOSIS — R509 Fever, unspecified: Secondary | ICD-10-CM

## 2017-09-12 DIAGNOSIS — Z79899 Other long term (current) drug therapy: Secondary | ICD-10-CM | POA: Diagnosis not present

## 2017-09-12 DIAGNOSIS — J189 Pneumonia, unspecified organism: Principal | ICD-10-CM | POA: Diagnosis present

## 2017-09-12 DIAGNOSIS — J4530 Mild persistent asthma, uncomplicated: Secondary | ICD-10-CM

## 2017-09-12 DIAGNOSIS — R0602 Shortness of breath: Secondary | ICD-10-CM | POA: Diagnosis not present

## 2017-09-12 DIAGNOSIS — J45909 Unspecified asthma, uncomplicated: Secondary | ICD-10-CM | POA: Insufficient documentation

## 2017-09-12 DIAGNOSIS — G3184 Mild cognitive impairment, so stated: Secondary | ICD-10-CM | POA: Insufficient documentation

## 2017-09-12 DIAGNOSIS — G4733 Obstructive sleep apnea (adult) (pediatric): Secondary | ICD-10-CM | POA: Insufficient documentation

## 2017-09-12 DIAGNOSIS — E119 Type 2 diabetes mellitus without complications: Secondary | ICD-10-CM

## 2017-09-12 LAB — CBC WITH DIFFERENTIAL/PLATELET
BASOS PCT: 0 %
Basophils Absolute: 0 10*3/uL (ref 0.0–0.1)
Eosinophils Absolute: 0 10*3/uL (ref 0.0–0.7)
Eosinophils Relative: 0 %
HEMATOCRIT: 41.4 % (ref 39.0–52.0)
Hemoglobin: 13.8 g/dL (ref 13.0–17.0)
LYMPHS ABS: 1.1 10*3/uL (ref 0.7–4.0)
Lymphocytes Relative: 5 %
MCH: 29.7 pg (ref 26.0–34.0)
MCHC: 33.3 g/dL (ref 30.0–36.0)
MCV: 89 fL (ref 78.0–100.0)
MONO ABS: 1.3 10*3/uL — AB (ref 0.1–1.0)
MONOS PCT: 6 %
NEUTROS ABS: 18.2 10*3/uL — AB (ref 1.7–7.7)
Neutrophils Relative %: 89 %
Platelets: 179 10*3/uL (ref 150–400)
RBC: 4.65 MIL/uL (ref 4.22–5.81)
RDW: 12.9 % (ref 11.5–15.5)
WBC: 20.5 10*3/uL — ABNORMAL HIGH (ref 4.0–10.5)

## 2017-09-12 LAB — GLUCOSE, CAPILLARY
GLUCOSE-CAPILLARY: 129 mg/dL — AB (ref 65–99)
GLUCOSE-CAPILLARY: 137 mg/dL — AB (ref 65–99)
GLUCOSE-CAPILLARY: 152 mg/dL — AB (ref 65–99)
Glucose-Capillary: 138 mg/dL — ABNORMAL HIGH (ref 65–99)

## 2017-09-12 LAB — BASIC METABOLIC PANEL
Anion gap: 10 (ref 5–15)
BUN: 19 mg/dL (ref 6–20)
CALCIUM: 9.3 mg/dL (ref 8.9–10.3)
CHLORIDE: 103 mmol/L (ref 101–111)
CO2: 23 mmol/L (ref 22–32)
Creatinine, Ser: 1.14 mg/dL (ref 0.61–1.24)
GFR calc non Af Amer: >60 mL/min (ref >60)
GLUCOSE: 129 mg/dL — AB (ref 65–99)
Potassium: 4.3 mmol/L (ref 3.5–5.1)
Sodium: 136 mmol/L (ref 135–145)

## 2017-09-12 LAB — URINALYSIS, ROUTINE W REFLEX MICROSCOPIC
BILIRUBIN URINE: NEGATIVE
Glucose, UA: NEGATIVE mg/dL
HGB URINE DIPSTICK: NEGATIVE
KETONES UR: NEGATIVE mg/dL
Leukocytes, UA: NEGATIVE
NITRITE: NEGATIVE
PROTEIN: NEGATIVE mg/dL
Specific Gravity, Urine: 1.009 (ref 1.005–1.030)
pH: 6 (ref 5.0–8.0)

## 2017-09-12 LAB — I-STAT CG4 LACTIC ACID, ED: Lactic Acid, Venous: 1.66 mmol/L (ref 0.5–1.9)

## 2017-09-12 LAB — STREP PNEUMONIAE URINARY ANTIGEN: STREP PNEUMO URINARY ANTIGEN: NEGATIVE

## 2017-09-12 MED ORDER — SODIUM CHLORIDE 0.9 % IV SOLN
500.0000 mg | INTRAVENOUS | Status: DC
  Administered 2017-09-13: 500 mg via INTRAVENOUS
  Filled 2017-09-12: qty 500

## 2017-09-12 MED ORDER — SODIUM CHLORIDE 0.9 % IV SOLN
INTRAVENOUS | Status: AC
  Administered 2017-09-12: 09:00:00 via INTRAVENOUS

## 2017-09-12 MED ORDER — SODIUM CHLORIDE 0.9 % IV SOLN
1.0000 g | Freq: Once | INTRAVENOUS | Status: AC
  Administered 2017-09-12: 1 g via INTRAVENOUS
  Filled 2017-09-12: qty 10

## 2017-09-12 MED ORDER — SENNOSIDES-DOCUSATE SODIUM 8.6-50 MG PO TABS: 1.0000 | ORAL_TABLET | Freq: Every evening | ORAL | Status: DC | PRN

## 2017-09-12 MED ORDER — AZITHROMYCIN 500 MG IV SOLR
500.0000 mg | Freq: Once | INTRAVENOUS | Status: AC
  Administered 2017-09-12: 500 mg via INTRAVENOUS
  Filled 2017-09-12: qty 500

## 2017-09-12 MED ORDER — ACETAMINOPHEN 650 MG RE SUPP: 650.0000 mg | Freq: Four times a day (QID) | RECTAL | Status: DC | PRN

## 2017-09-12 MED ORDER — SODIUM CHLORIDE 0.9 % IV SOLN
1.0000 g | INTRAVENOUS | Status: DC
  Administered 2017-09-13: 1 g via INTRAVENOUS
  Filled 2017-09-12: qty 1

## 2017-09-12 MED ORDER — IPRATROPIUM-ALBUTEROL 0.5-2.5 (3) MG/3ML IN SOLN
3.0000 mL | Freq: Three times a day (TID) | RESPIRATORY_TRACT | Status: DC
  Administered 2017-09-12 – 2017-09-13 (×3): 3 mL via RESPIRATORY_TRACT
  Filled 2017-09-12 (×4): qty 3

## 2017-09-12 MED ORDER — IPRATROPIUM-ALBUTEROL 0.5-2.5 (3) MG/3ML IN SOLN
3.0000 mL | Freq: Three times a day (TID) | RESPIRATORY_TRACT | Status: DC
  Administered 2017-09-12: 3 mL via RESPIRATORY_TRACT

## 2017-09-12 MED ORDER — SIMVASTATIN 40 MG PO TABS
40.0000 mg | ORAL_TABLET | Freq: Every day | ORAL | Status: DC
  Administered 2017-09-12: 40 mg via ORAL
  Filled 2017-09-12: qty 2

## 2017-09-12 MED ORDER — ALBUTEROL SULFATE (2.5 MG/3ML) 0.083% IN NEBU: 2.5000 mg | INHALATION_SOLUTION | RESPIRATORY_TRACT | Status: DC | PRN

## 2017-09-12 MED ORDER — ONDANSETRON HCL 4 MG/2ML IJ SOLN: 4.0000 mg | Freq: Four times a day (QID) | INTRAMUSCULAR | Status: DC | PRN

## 2017-09-12 MED ORDER — INSULIN ASPART 100 UNIT/ML ~~LOC~~ SOLN
0.0000 [IU] | Freq: Three times a day (TID) | SUBCUTANEOUS | Status: DC
  Administered 2017-09-12 (×2): 1 [IU] via SUBCUTANEOUS

## 2017-09-12 MED ORDER — HYDROCODONE-ACETAMINOPHEN 5-325 MG PO TABS: 1.0000 | ORAL_TABLET | ORAL | Status: DC | PRN

## 2017-09-12 MED ORDER — ACETAMINOPHEN 325 MG PO TABS: 650.0000 mg | ORAL_TABLET | Freq: Four times a day (QID) | ORAL | Status: DC | PRN

## 2017-09-12 MED ORDER — ONDANSETRON HCL 4 MG PO TABS: 4.0000 mg | ORAL_TABLET | Freq: Four times a day (QID) | ORAL | Status: DC | PRN

## 2017-09-12 MED ORDER — CYANOCOBALAMIN 500 MCG PO TABS
500.0000 ug | ORAL_TABLET | Freq: Every day | ORAL | Status: DC
  Administered 2017-09-12 – 2017-09-13 (×2): 500 ug via ORAL
  Filled 2017-09-12 (×2): qty 1

## 2017-09-12 MED ORDER — POLYETHYLENE GLYCOL 3350 17 G PO PACK
17.0000 g | PACK | Freq: Every day | ORAL | Status: DC
  Administered 2017-09-12: 17 g via ORAL
  Filled 2017-09-12 (×2): qty 1

## 2017-09-12 MED ORDER — SODIUM CHLORIDE 0.9 % IV BOLUS
1000.0000 mL | Freq: Once | INTRAVENOUS | Status: AC
Start: 2017-09-12 — End: 2017-09-12
  Administered 2017-09-12: 1000 mL via INTRAVENOUS

## 2017-09-12 MED ORDER — MOMETASONE FURO-FORMOTEROL FUM 200-5 MCG/ACT IN AERO
2.0000 | INHALATION_SPRAY | Freq: Two times a day (BID) | RESPIRATORY_TRACT | Status: DC
  Administered 2017-09-12 – 2017-09-13 (×2): 2 via RESPIRATORY_TRACT
  Filled 2017-09-12: qty 8.8

## 2017-09-12 MED ORDER — ONDANSETRON HCL 4 MG/2ML IJ SOLN
4.0000 mg | Freq: Once | INTRAMUSCULAR | Status: AC
  Administered 2017-09-12: 4 mg via INTRAVENOUS
  Filled 2017-09-12: qty 2

## 2017-09-12 MED ORDER — INSULIN ASPART 100 UNIT/ML ~~LOC~~ SOLN: 0.0000 [IU] | Freq: Every day | SUBCUTANEOUS | Status: DC

## 2017-09-12 MED ORDER — ENOXAPARIN SODIUM 40 MG/0.4ML ~~LOC~~ SOLN
40.0000 mg | SUBCUTANEOUS | Status: DC
  Administered 2017-09-12 – 2017-09-13 (×2): 40 mg via SUBCUTANEOUS
  Filled 2017-09-12 (×2): qty 0.4

## 2017-09-12 NOTE — ED Triage Notes (Signed)
Pt complains of being short of breath, chills, a low grade fever Pt states that he feels the same as he did when he was dx with pneumonia Pt denies chest pain

## 2017-09-12 NOTE — ED Provider Notes (Signed)
[facility] DEPT Provider Note: Robert Spurling, MD, FACEP  CSN: [id number] MRN: [id number] ARRIVAL: 09/12/17 at [place]: [facility]  Fever and Cough   HISTORY OF PRESENT ILLNESS  09/12/17 4:07 AM [facility] is a 72 y.o. male who developed shaking chills about 7 PM yesterday evening followed by profuse nausea and vomiting.  He is also had weakness, shortness of breath and fever to 102.  Symptoms are similar to previous episodes of pneumonia.  He has not had diarrhea.  He attempted to take an antibiotic that was prescribed should he develop the symptoms but was unable to keep it on his stomach.   Past Medical History:  Diagnosis Date  . Asthma   . Calcific Achilles tendonitis    bilateral  . Controlled type 2 diabetes mellitus without complication ([place])   . DDD (degenerative disc disease), cervical   . Gallstone 09/11/2011  . GERD (gastroesophageal reflux disease)   . Hyperlipidemia    mixed  . Insomnia   . Mild cognitive impairment   . Overweight (BMI 25.0-29.9)   . Prostate cancer ([place])   . Pulmonary nodule/lesion, solitary 06/2004   f/u in 2014, 2015 stable in [facility]  . Sleep apnea   . Slow transit constipation   . Vitamin D deficiency     Past Surgical History:  Procedure Laterality Date  . CERVICAL FUSION  1999 & 2004   implant metal plate  . CHOLECYSTECTOMY  2012  . EYE SURGERY  1997   laser vision correction  . palatouvulopasty  1995  . PROSTATE SURGERY  09/11/2011   prostatectomy for prostate cancer     Family History  Problem Relation Age of Onset  . Cancer Mother   . Heart disease Father   . Heart disease Brother   . Diabetes Brother     Social History   Tobacco Use  . Smoking status: Never Smoker  . Smokeless tobacco: Never Used  Substance Use Topics  . Alcohol use: Yes  . Drug use: No    Prior to Admission medications   Medication Sig Start Date End Date Taking? Authorizing Provider  albuterol (PROVENTIL  HFA;VENTOLIN HFA) 108 (90 Base) MCG/ACT inhaler Inhale 1 puff into the lungs 2 (two) times daily.   Yes [provider]  Coenzyme Q10 (CO Q 10) 100 MG CAPS Take 2 capsules by mouth daily.   Yes [provider]  fluticasone (FLONASE) 50 MCG/ACT nasal spray Place 1 spray into both nostrils as needed for allergies or rhinitis.   Yes [provider]  Fluticasone-Salmeterol (ADVAIR) 250-50 MCG/DOSE AEPB Inhale 1 puff into the lungs 2 (two) times daily.   Yes [provider]  metFORMIN (GLUCOPHAGE-XR) 500 MG 24 hr tablet Take 500 mg by mouth daily with breakfast.   Yes [provider]  polyethylene glycol (MIRALAX / GLYCOLAX) packet Take 17 g by mouth daily.   Yes [provider]  simvastatin (ZOCOR) 40 MG tablet Take 40 mg by mouth daily.   Yes [provider]  valACYclovir (VALTREX) 1000 MG tablet Take 1,000 mg by mouth as needed (fever blister).    Yes [provider]  vitamin B-12 (CYANOCOBALAMIN) 500 MCG tablet Take 500 mcg by mouth daily.   Yes [provider]  Vitamin D, Ergocalciferol, (DRISDOL) 50000 units CAPS capsule Take 50,000 Units by mouth every 14 (fourteen) days.   Yes [provider]  tadalafil (CIALIS) 20 MG tablet Take 20 mg by mouth daily  as needed for erectile dysfunction.    [provider]    Allergies Tetracyclines & related; Mucinex [guaifenesin er]; Niacin and related; and Wellbutrin [bupropion]   REVIEW OF SYSTEMS  Negative except as noted here or in the History of Present Illness.   PHYSICAL EXAMINATION  Initial Vital Signs Blood pressure 123/61, pulse 84, temperature 98.7 F (37.1 C), temperature source Oral, resp. rate 18, SpO2 97 %.  Examination General: Well-developed, well-nourished male in no acute distress; appearance consistent with age of record HENT: normocephalic; atraumatic Eyes: pupils equal, round and reactive to light; extraocular muscles intact Neck:  supple Heart: regular rate and rhythm Lungs: clear to auscultation bilaterally Abdomen: soft; nondistended; nontender; no masses or hepatosplenomegaly; bowel sounds present Extremities: No deformity; full range of motion; pulses normal Neurologic: Awake but lethargic; oriented; motor function intact in all extremities and symmetric; no facial droop Skin: Warm and dry Psychiatric: Flat affect   RESULTS  Summary of this visit's results, reviewed by myself:   EKG Interpretation  Date/Time:    Ventricular Rate:    PR Interval:    QRS Duration:   QT Interval:    QTC Calculation:   R Axis:     Text Interpretation:        Laboratory Studies: Results for orders placed or performed during the hospital encounter of 09/12/17 (from the past 24 hour(s))  CBC with Differential/Platelet     Status: Abnormal   Collection Time: 09/12/17  4:57 AM  Result Value Ref Range   WBC 20.5 (H) 4.0 - 10.5 K/uL   RBC 4.65 4.22 - 5.81 MIL/uL   Hemoglobin 13.8 13.0 - 17.0 g/dL   HCT 41.4 39.0 - 52.0 %   MCV 89.0 78.0 - 100.0 fL   MCH 29.7 26.0 - 34.0 pg   MCHC 33.3 30.0 - 36.0 g/dL   RDW 12.9 11.5 - 15.5 %   Platelets 179 150 - 400 K/uL   Neutrophils Relative % 89 %   Neutro Abs 18.2 (H) 1.7 - 7.7 K/uL   Lymphocytes Relative 5 %   Lymphs Abs 1.1 0.7 - 4.0 K/uL   Monocytes Relative 6 %   Monocytes Absolute 1.3 (H) 0.1 - 1.0 K/uL   Eosinophils Relative 0 %   Eosinophils Absolute 0.0 0.0 - 0.7 K/uL   Basophils Relative 0 %   Basophils Absolute 0.0 0.0 - 0.1 K/uL  Basic metabolic panel     Status: Abnormal   Collection Time: 09/12/17  4:57 AM  Result Value Ref Range   Sodium 136 135 - 145 mmol/L   Potassium 4.3 3.5 - 5.1 mmol/L   Chloride 103 101 - 111 mmol/L   CO2 23 22 - 32 mmol/L   Glucose, Bld 129 (H) 65 - 99 mg/dL   BUN 19 6 - 20 mg/dL   Creatinine, Ser 1.14 0.61 - 1.24 mg/dL   Calcium 9.3 8.9 - 10.3 mg/dL   GFR calc non Af Amer >60 >60 mL/min   GFR calc Af Amer >60 >60 mL/min    Anion gap 10 5 - 15  I-Stat CG4 Lactic Acid, ED     Status: None   Collection Time: 09/12/17  5:04 AM  Result Value Ref Range   Lactic Acid, Venous 1.66 0.5 - 1.9 mmol/L   Imaging Studies: Dg Chest 2 View  Result Date: 09/12/2017 CLINICAL DATA:  Acute onset of shortness of breath, fever and generalized weakness. EXAM: CHEST - 2 VIEW COMPARISON:  None. FINDINGS: The lungs are  well-aerated. Retrocardiac airspace opacity raises concern for pneumonia. There is no evidence of pleural effusion or pneumothorax. The heart is normal in size; the mediastinal contour is within normal limits. No acute osseous abnormalities are seen. IMPRESSION: Retrocardiac airspace opacity raises concern for pneumonia. Electronically Signed   By: Garald Balding M.D.   On: 09/12/2017 03:10    ED COURSE and MDM  Nursing notes and initial vitals signs, including pulse oximetry, reviewed.  Vitals:   09/12/17 0240 09/12/17 0500  BP: 123/61 112/70  Pulse: 84 77  Resp: 18 (!) 22  Temp: 98.7 F (37.1 C)   TempSrc: Oral   SpO2: 97% 93%   4:20 AM Sepsis protocol she dated for reported shaking chills and fever although he does not meet SIRS criteria at this time.  Rocephin and Zithromax ordered for retrocardiac pneumonia seen on chest x-ray.  6:15 AM Dr. Myna Hidalgo to admit to hospitalist service.  PROCEDURES    ED DIAGNOSES     ICD-10-CM   1. Fever and chills R50.9   2. Community acquired pneumonia, unspecified laterality J18.9        Khaniya Tenaglia, MD 09/12/17 [id number]

## 2017-09-12 NOTE — Progress Notes (Signed)
PROGRESS NOTE  Robert Norman [facility] HER:[id number] DOB: Feb 26, 1946 DOA: 09/12/2017 PCP: [name], DO  HPI/Recap of past 24 hours: Robert Norman is a 72 y.o. male with medical history significant for asthma, type 2 diabetes mellitus, OSA on CPAP, now presenting to the emergency department for evaluation of productive cough, shortness of breath, general malaise, subjective fevers/chills, nausea with episodes of vomiting (over 24H) for the past couple of days. In the ED, noted to be tachypneic, and with vitals otherwise stable, leukocytosis of 20,000. Chest x-ray is notable for retrocardiac airspace disease concerning for CAP. Pt admitted for further management  Today, patient still reported nonproductive cough, with generalized fatigue.  Feels his breathing is shallow.  Denies any chest pain, worsening shortness of breath, abdominal pain, fever/chills.  Assessment/Plan: Principal Problem:   Pneumonia Active Problems:   Controlled type 2 diabetes mellitus without complication (HCC)   Asthma   Nausea & vomiting  CAP Afebrile with leukocytosis Urine Strep pneumo, Legionella all pending BC X 2 pending CXR shows retrocardiac airspace opacity Continue IV Rocephin and azithromycin Duo nebs, supplemental oxygen as needed  Asthma  Stable, no wheezing noted Continue duo nebs, Dulera  DM type 2 Last A1c 6.1 Held home metformin Sensitive SSI, Accu-Cheks  Hyperlipidemia Continue statins  OSA Compliant with CPAP   Code Status: Full  Family Communication: Wife at bedside  Disposition Plan: Home by 09/13/17   Consultants:  None  Procedures:  None  Antimicrobials:  Rocephin  Azithromycin  DVT prophylaxis: Lovenox   Objective: Vitals:   09/12/17 0730 09/12/17 1032 09/12/17 1037 09/12/17 1345  BP: 109/65   113/70  Pulse: 81   76  Resp: 15   14  Temp:    98 F (36.7 C)  TempSrc:    Oral  SpO2: 94% 96% 96% 97%    Intake/Output Summary (Last 24  hours) at 09/12/2017 1653 Last data filed at 09/12/2017 1300 Gross per 24 hour  Intake 1475 ml  Output 701 ml  Net 774 ml   There were no vitals filed for this visit.  Exam:   General: NAD  Cardiovascular: S1, S2 present  Respiratory: CTAB  Abdomen: Soft, nontender, nondistended, bowel sounds present  Musculoskeletal: No edema bilaterally  Skin: Normal  Psychiatry: Normal mood   Data Reviewed: CBC: Recent Labs  Lab 09/12/17 0457  WBC 20.5*  NEUTROABS 18.2*  HGB 13.8  HCT 41.4  MCV 89.0  PLT 856   Basic Metabolic Panel: Recent Labs  Lab 09/12/17 0457  NA 136  K 4.3  CL 103  CO2 23  GLUCOSE 129*  BUN 19  CREATININE 1.14  CALCIUM 9.3   GFR: CrCl cannot be calculated (Unknown ideal weight.). Liver Function Tests: No results for input(s): AST, ALT, ALKPHOS, BILITOT, PROT, ALBUMIN in the last 168 hours. No results for input(s): LIPASE, AMYLASE in the last 168 hours. No results for input(s): AMMONIA in the last 168 hours. Coagulation Profile: No results for input(s): INR, PROTIME in the last 168 hours. Cardiac Enzymes: No results for input(s): CKTOTAL, CKMB, CKMBINDEX, TROPONINI in the last 168 hours. BNP (last 3 results) No results for input(s): PROBNP in the last 8760 hours. HbA1C: No results for input(s): HGBA1C in the last 72 hours. CBG: Recent Labs  Lab 09/12/17 0835 09/12/17 1208 09/12/17 1612  GLUCAP 129* 138* 137*   Lipid Profile: No results for input(s): CHOL, HDL, LDLCALC, TRIG, CHOLHDL, LDLDIRECT in the last 72 hours. Thyroid Function Tests: No results for input(s):  TSH, T4TOTAL, FREET4, T3FREE, THYROIDAB in the last 72 hours. Anemia Panel: No results for input(s): VITAMINB12, FOLATE, FERRITIN, TIBC, IRON, RETICCTPCT in the last 72 hours. Urine analysis: No results found for: COLORURINE, APPEARANCEUR, LABSPEC, PHURINE, GLUCOSEU, HGBUR, BILIRUBINUR, KETONESUR, PROTEINUR, UROBILINOGEN, NITRITE, LEUKOCYTESUR Sepsis  Labs: @LABRCNTIP (procalcitonin:4,lacticidven:4)  )No results found for this or any previous visit (from the past 240 hour(s)).    Studies: Dg Chest 2 View  Result Date: 09/12/2017 CLINICAL DATA:  Acute onset of shortness of breath, fever and generalized weakness. EXAM: CHEST - 2 VIEW COMPARISON:  None. FINDINGS: The lungs are well-aerated. Retrocardiac airspace opacity raises concern for pneumonia. There is no evidence of pleural effusion or pneumothorax. The heart is normal in size; the mediastinal contour is within normal limits. No acute osseous abnormalities are seen. IMPRESSION: Retrocardiac airspace opacity raises concern for pneumonia. Electronically Signed   By: Garald Balding M.D.   On: 09/12/2017 03:10    Scheduled Meds: . vitamin B-12  500 mcg Oral Daily  . enoxaparin (LOVENOX) injection  40 mg Subcutaneous Q24H  . insulin aspart  0-5 Units Subcutaneous QHS  . insulin aspart  0-9 Units Subcutaneous TID WC  . ipratropium-albuterol  3 mL Nebulization TID  . mometasone-formoterol  2 puff Inhalation BID  . polyethylene glycol  17 g Oral Daily  . simvastatin  40 mg Oral q1800    Continuous Infusions: . sodium chloride 100 mL/hr at 09/12/17 0927  . [START ON 09/13/2017] azithromycin    . [START ON 09/13/2017] cefTRIAXone (ROCEPHIN)  IV       LOS: 0 days     Alma Friendly, MD [facility]   If 7PM-7AM, please contact night-coverage [email or web address] Password [email or web address] 09/12/2017, 4:53 PM

## 2017-09-12 NOTE — ED Notes (Signed)
ED TO INPATIENT HANDOFF REPORT  Name/Age/Gender Robert Norman 72 y.o. male  Code Status    Code Status Orders  (From admission, onward)        Start     Ordered   09/12/17 0613  Full code  Continuous     09/12/17 0614    Code Status History    This patient has a current code status but no historical code status.      Home/SNF/Other Skilled nursing facility  Chief Complaint emesis; fever (102)  Level of Care/Admitting Diagnosis ED Disposition    ED Disposition Condition Comment   Admit  Hospital Area: [facility] [[id number]]  Level of Care: Med-Surg [16]  Diagnosis: Pneumonia [427062]  Admitting Physician: Vianne Bulls [[id number]]  Attending Physician: Vianne Bulls [[id number]]  PT Class (Do Not Modify): Observation [104]  PT Acc Code (Do Not Modify): Observation [10022]       Medical History Past Medical History:  Diagnosis Date  . Asthma   . Calcific Achilles tendonitis    bilateral  . Controlled type 2 diabetes mellitus without complication ([place])   . DDD (degenerative disc disease), cervical   . Gallstone 09/11/2011  . GERD (gastroesophageal reflux disease)   . Hyperlipidemia    mixed  . Insomnia   . Mild cognitive impairment   . Overweight (BMI 25.0-29.9)   . Prostate cancer ([place])   . Pulmonary nodule/lesion, solitary 06/2004   f/u in 2014, 2015 stable in [facility]  . Sleep apnea   . Slow transit constipation   . Vitamin D deficiency     Allergies Allergies  Allergen Reactions  . Tetracyclines & Related Other (See Comments)    hepatitis  . Mucinex [Guaifenesin Er] Other (See Comments)    Irritability, nausea  . Niacin And Related Other (See Comments)    headache  . Wellbutrin [Bupropion] Other (See Comments)    dizziness    IV Location/Drains/Wounds Patient Lines/Drains/Airways Status   Active Line/Drains/Airways    Name:   Placement date:   Placement time:   Site:   Days:   Peripheral IV 09/12/17 Left Forearm    09/12/17    0445    Forearm   less than 1          Labs/Imaging Results for orders placed or performed during the hospital encounter of 09/12/17 (from the past 48 hour(s))  CBC with Differential/Platelet     Status: Abnormal   Collection Time: 09/12/17  4:57 AM  Result Value Ref Range   WBC 20.5 (H) 4.0 - 10.5 K/uL   RBC 4.65 4.22 - 5.81 MIL/uL   Hemoglobin 13.8 13.0 - 17.0 g/dL   HCT 41.4 39.0 - 52.0 %   MCV 89.0 78.0 - 100.0 fL   MCH 29.7 26.0 - 34.0 pg   MCHC 33.3 30.0 - 36.0 g/dL   RDW 12.9 11.5 - 15.5 %   Platelets 179 150 - 400 K/uL   Neutrophils Relative % 89 %   Neutro Abs 18.2 (H) 1.7 - 7.7 K/uL   Lymphocytes Relative 5 %   Lymphs Abs 1.1 0.7 - 4.0 K/uL   Monocytes Relative 6 %   Monocytes Absolute 1.3 (H) 0.1 - 1.0 K/uL   Eosinophils Relative 0 %   Eosinophils Absolute 0.0 0.0 - 0.7 K/uL   Basophils Relative 0 %   Basophils Absolute 0.0 0.0 - 0.1 K/uL    Comment: Performed at [facility], [address] [place],  [place] [address]  Basic metabolic panel     Status: Abnormal   Collection Time: 09/12/17  4:57 AM  Result Value Ref Range   Sodium 136 135 - 145 mmol/L   Potassium 4.3 3.5 - 5.1 mmol/L   Chloride 103 101 - 111 mmol/L   CO2 23 22 - 32 mmol/L   Glucose, Bld 129 (H) 65 - 99 mg/dL   BUN 19 6 - 20 mg/dL   Creatinine, Ser 1.14 0.61 - 1.24 mg/dL   Calcium 9.3 8.9 - 10.3 mg/dL   GFR calc non Af Amer >60 >60 mL/min   GFR calc Af Amer >60 >60 mL/min    Comment: (NOTE) The eGFR has been calculated using the CKD EPI equation. This calculation has not been validated in all clinical situations. eGFR's persistently <60 mL/min signify possible Chronic Kidney Disease.    Anion gap 10 5 - 15    Comment: Performed at [facility], [place] [address] [place], [place] [address]  I-Stat CG4 Lactic Acid, ED     Status: None   Collection Time: 09/12/17  5:04 AM  Result Value Ref Range   Lactic Acid, Venous 1.66 0.5 - 1.9 mmol/L    Dg Chest 2 View  Result Date: 09/12/2017 CLINICAL DATA:  Acute onset of shortness of breath, fever and generalized weakness. EXAM: CHEST - 2 VIEW COMPARISON:  None. FINDINGS: The lungs are well-aerated. Retrocardiac airspace opacity raises concern for pneumonia. There is no evidence of pleural effusion or pneumothorax. The heart is normal in size; the mediastinal contour is within normal limits. No acute osseous abnormalities are seen. IMPRESSION: Retrocardiac airspace opacity raises concern for pneumonia. Electronically Signed   By: Garald Balding M.D.   On: 09/12/2017 03:10    Pending Labs Unresulted Labs (From admission, onward)   Start     Ordered   09/19/17 0500  Creatinine, serum  (enoxaparin (LOVENOX)    CrCl >/= 30 ml/min)  Weekly,   R    Comments:  while on enoxaparin therapy    09/12/17 0614   09/12/17 0612  Culture, sputum-assessment  Once,   R     09/12/17 0614   09/12/17 0612  Gram stain  Once,   R     09/12/17 0614   09/12/17 0612  Strep pneumoniae urinary antigen  Once,   R     09/12/17 0614   09/12/17 0420  Blood culture (routine x 2)  BLOOD CULTURE X 2,   STAT     09/12/17 0419      Vitals/Pain Today's Vitals   09/12/17 0645 09/12/17 0700 09/12/17 0704 09/12/17 0730  BP:  107/76  109/65  Pulse: 80 83 85 81  Resp: _0 Temp:      TempSrc:      SpO2: 95% (!) 86% 94% 94%  PainSc:        Isolation Precautions No active isolations  Medications Medications  albuterol (PROVENTIL HFA;VENTOLIN HFA) 108 (90 Base) MCG/ACT inhaler 2 puff (has no administration in time range)  mometasone-formoterol (DULERA) 200-5 MCG/ACT inhaler 2 puff (has no administration in time range)  polyethylene glycol (MIRALAX / GLYCOLAX) packet 17 g (has no administration in time range)  simvastatin (ZOCOR) tablet 40 mg (has no administration in time range)  vitamin B-12 (CYANOCOBALAMIN) tablet 500 mcg (has no administration in time range)  enoxaparin (LOVENOX) injection 40 mg  (has no administration in time range)  0.9 %  sodium chloride infusion (has no  administration in time range)  acetaminophen (TYLENOL) tablet 650 mg (has no administration in time range)    Or  acetaminophen (TYLENOL) suppository 650 mg (has no administration in time range)  HYDROcodone-acetaminophen (NORCO/VICODIN) 5-325 MG per tablet 1-2 tablet (has no administration in time range)  senna-docusate (Senokot-S) tablet 1 tablet (has no administration in time range)  ondansetron (ZOFRAN) tablet 4 mg (has no administration in time range)    Or  ondansetron (ZOFRAN) injection 4 mg (has no administration in time range)  cefTRIAXone (ROCEPHIN) 1 g in sodium chloride 0.9 % 100 mL IVPB (has no administration in time range)  azithromycin (ZITHROMAX) 500 mg in sodium chloride 0.9 % 250 mL IVPB (has no administration in time range)  insulin aspart (novoLOG) injection 0-9 Units (has no administration in time range)  insulin aspart (novoLOG) injection 0-5 Units (has no administration in time range)  sodium chloride 0.9 % bolus 1,000 mL (1,000 mLs Intravenous New Bag/Given 09/12/17 0448)  ondansetron (ZOFRAN) injection 4 mg (4 mg Intravenous Given 09/12/17 0448)  cefTRIAXone (ROCEPHIN) 1 g in sodium chloride 0.9 % 100 mL IVPB (0 g Intravenous Stopped 09/12/17 0544)  azithromycin (ZITHROMAX) 500 mg in sodium chloride 0.9 % 250 mL IVPB (0 mg Intravenous Stopped 09/12/17 0651)    Mobility walks with person assist

## 2017-09-12 NOTE — H&P (Signed)
History and Physical    Robert Norman HWE:[id number] DOB: [birth year] DOA: 09/12/2017  PCP: [name], DO   Patient coming from: Independent living facility   Chief Complaint: Productive cough, SOB, fever/chills, malaise, N/V  HPI: Robert Norman is a 72 y.o. male with medical history significant for asthma and type 2 diabetes mellitus, now presenting to the emergency department for evaluation of productive cough, shortness of breath, general malaise, subjective fevers and chills, and nausea with vomiting.  Patient reports that he had been experiencing rhinorrhea, sore throat, and cough over a week ago, the symptoms had nearly resolved, but he then developed shortness of breath, malaise, subjective fevers with chills, and nausea with nonbloody vomiting yesterday.  The symptoms worsened throughout the day and he was sent to the ED for further evaluation.  ED Course: Upon arrival to the ED, patient is found to be afebrile, saturating adequately on room air, tachypneic, and with vitals otherwise stable.  Chest x-ray is notable for retrocardiac airspace disease concerning for pneumonia.  Chemistry panel is unremarkable and CBC is notable for a leukocytosis to 20,500.  Lactic acid is reassuringly normal.  Blood cultures were collected, 1 L of normal saline was administered, and the patient was started on Rocephin and azithromycin.  Patient remains hemodynamically stable and will be observed on the medical-surgical unit for ongoing evaluation and management of pneumonia.  Review of Systems:  All other systems reviewed and apart from HPI, are negative.  Past Medical History:  Diagnosis Date  . Asthma   . Calcific Achilles tendonitis    bilateral  . Controlled type 2 diabetes mellitus without complication ([place])   . DDD (degenerative disc disease), cervical   . Gallstone 09/11/2011  . GERD (gastroesophageal reflux disease)   . Hyperlipidemia    mixed  . Insomnia   . Mild  cognitive impairment   . Overweight (BMI 25.0-29.9)   . Prostate cancer ([place])   . Pulmonary nodule/lesion, solitary 06/2004   f/u in 2014, 2015 stable in [facility]  . Sleep apnea   . Slow transit constipation   . Vitamin D deficiency     Past Surgical History:  Procedure Laterality Date  . CERVICAL FUSION  1999 & 2004   implant metal plate  . CHOLECYSTECTOMY  2012  . EYE SURGERY  1997   laser vision correction  . palatouvulopasty  1995  . PROSTATE SURGERY  09/11/2011   prostatectomy for prostate cancer      reports that he has never smoked. He has never used smokeless tobacco. He reports that he drinks alcohol. He reports that he does not use drugs.  Allergies  Allergen Reactions  . Tetracyclines & Related Other (See Comments)    hepatitis  . Mucinex [Guaifenesin Er] Other (See Comments)    Irritability, nausea  . Niacin And Related Other (See Comments)    headache  . Wellbutrin [Bupropion] Other (See Comments)    dizziness    Family History  Problem Relation Age of Onset  . Cancer Mother   . Heart disease Father   . Heart disease Brother   . Diabetes Brother      Prior to Admission medications   Medication Sig Start Date End Date Taking? Authorizing Provider  albuterol (PROVENTIL HFA;VENTOLIN HFA) 108 (90 Base) MCG/ACT inhaler Inhale 1 puff into the lungs 2 (two) times daily.   Yes [provider]  Coenzyme Q10 (CO Q 10) 100 MG CAPS Take 2 capsules by mouth daily.  Yes [provider]  fluticasone (FLONASE) 50 MCG/ACT nasal spray Place 1 spray into both nostrils as needed for allergies or rhinitis.   Yes [provider]  Fluticasone-Salmeterol (ADVAIR) 250-50 MCG/DOSE AEPB Inhale 1 puff into the lungs 2 (two) times daily.   Yes [provider]  metFORMIN (GLUCOPHAGE-XR) 500 MG 24 hr tablet Take 500 mg by mouth daily with breakfast.   Yes [provider]  polyethylene glycol (MIRALAX / GLYCOLAX) packet Take 17 g by mouth  daily.   Yes [provider]  simvastatin (ZOCOR) 40 MG tablet Take 40 mg by mouth daily.   Yes [provider]  valACYclovir (VALTREX) 1000 MG tablet Take 1,000 mg by mouth as needed (fever blister).    Yes [provider]  vitamin B-12 (CYANOCOBALAMIN) 500 MCG tablet Take 500 mcg by mouth daily.   Yes [provider]  Vitamin D, Ergocalciferol, (DRISDOL) 50000 units CAPS capsule Take 50,000 Units by mouth every 14 (fourteen) days.   Yes [provider]  tadalafil (CIALIS) 20 MG tablet Take 20 mg by mouth daily as needed for erectile dysfunction.    [provider]    Physical Exam: Vitals:   09/12/17 0240 09/12/17 0500 09/12/17 0530  BP: 123/61 112/70 119/68  Pulse: 84 77 85  Resp: 18 (!) 22 17  Temp: 98.7 F (37.1 C)    TempSrc: Oral    SpO2: 97% 93% 93%      Constitutional: NAD, calm, listless Eyes: PERTLA, lids and conjunctivae normal ENMT: Mucous membranes are moist. Posterior pharynx clear of any exudate or lesions.   Neck: normal, supple, no masses, no thyromegaly Respiratory: Mild tachypnea and dyspnea with speech. Rhonchi at left base. No accessory muscle use.  Cardiovascular: S1 & S2 heard, regular rate and rhythm. No extremity edema.  Abdomen: No distension, no tenderness, soft. Bowel sounds active.  Musculoskeletal: no clubbing / cyanosis. No joint deformity upper and lower extremities.    Skin: no significant rashes, lesions, ulcers. Warm, dry, well-perfused. Neurologic: CN 2-12 grossly intact. Sensation intact. Strength 5/5 in all 4 limbs.  Psychiatric: Alert and oriented x 3. Pleasant and cooperative.     Labs on Admission: I have personally reviewed following labs and imaging studies  CBC: Recent Labs  Lab 09/12/17 0457  WBC 20.5*  NEUTROABS 18.2*  HGB 13.8  HCT 41.4  MCV 89.0  PLT 382   Basic Metabolic Panel: Recent Labs  Lab 09/12/17 0457  NA 136  K 4.3  CL 103  CO2 23  GLUCOSE 129*  BUN  19  CREATININE 1.14  CALCIUM 9.3   GFR: CrCl cannot be calculated (Unknown ideal weight.). Liver Function Tests: No results for input(s): AST, ALT, ALKPHOS, BILITOT, PROT, ALBUMIN in the last 168 hours. No results for input(s): LIPASE, AMYLASE in the last 168 hours. No results for input(s): AMMONIA in the last 168 hours. Coagulation Profile: No results for input(s): INR, PROTIME in the last 168 hours. Cardiac Enzymes: No results for input(s): CKTOTAL, CKMB, CKMBINDEX, TROPONINI in the last 168 hours. BNP (last 3 results) No results for input(s): PROBNP in the last 8760 hours. HbA1C: No results for input(s): HGBA1C in the last 72 hours. CBG: No results for input(s): GLUCAP in the last 168 hours. Lipid Profile: No results for input(s): CHOL, HDL, LDLCALC, TRIG, CHOLHDL, LDLDIRECT in the last 72 hours. Thyroid Function Tests: No results for input(s): TSH, T4TOTAL, FREET4, T3FREE, THYROIDAB in the last 72 hours. Anemia Panel: No results for  input(s): VITAMINB12, FOLATE, FERRITIN, TIBC, IRON, RETICCTPCT in the last 72 hours. Urine analysis: No results found for: COLORURINE, APPEARANCEUR, LABSPEC, PHURINE, GLUCOSEU, HGBUR, BILIRUBINUR, KETONESUR, PROTEINUR, UROBILINOGEN, NITRITE, LEUKOCYTESUR Sepsis Labs: @LABRCNTIP (procalcitonin:4,lacticidven:4) )No results found for this or any previous visit (from the past 240 hour(s)).   Radiological Exams on Admission: Dg Chest 2 View  Result Date: 09/12/2017 CLINICAL DATA:  Acute onset of shortness of breath, fever and generalized weakness. EXAM: CHEST - 2 VIEW COMPARISON:  None. FINDINGS: The lungs are well-aerated. Retrocardiac airspace opacity raises concern for pneumonia. There is no evidence of pleural effusion or pneumothorax. The heart is normal in size; the mediastinal contour is within normal limits. No acute osseous abnormalities are seen. IMPRESSION: Retrocardiac airspace opacity raises concern for pneumonia. Electronically Signed   By:  Garald Balding M.D.   On: 09/12/2017 03:10    EKG: Not performed.   Assessment/Plan   1. Pneumonia  - Presents with SOB, productive cough, fever/chills, and N/V  - Found to have a left-sided pneumonia  - Blood cultures collected in ED and empiric Rocephin and azithromycin started  - Check sputum culture and strep pneumo antigen, continue Rocephin and azithromycin, continue prn supplemental O2   2. Asthma  - No wheezing on admission  - Continue ICS/LABA and prn albuterol    3. Type II DM  - A1c was 6.1% last year  - Managed with metformin at home, held on admission  - Check CBG's and use a low-intensity SSI with Novolog as needed    4. Nausea & vomiting  - Reports nausea with non-bloody vomiting just prior to arrival  - No abdominal pain; exam benign  - Likely secondary to PNA, continue abx and supportive care     DVT prophylaxis: Lovenox Code Status: Full  Family Communication: Significant other updated at bedside Consults called: None Admission status: Observation    Vianne Bulls, MD [facility] Pager [phone]  If 7PM-7AM, please contact night-coverage [email or web address] Password [facility]  09/12/2017, 6:15 AM

## 2017-09-13 DIAGNOSIS — J4530 Mild persistent asthma, uncomplicated: Secondary | ICD-10-CM | POA: Diagnosis not present

## 2017-09-13 DIAGNOSIS — J189 Pneumonia, unspecified organism: Secondary | ICD-10-CM | POA: Diagnosis not present

## 2017-09-13 DIAGNOSIS — E119 Type 2 diabetes mellitus without complications: Secondary | ICD-10-CM | POA: Diagnosis not present

## 2017-09-13 LAB — CBC WITH DIFFERENTIAL/PLATELET
BASOS ABS: 0 10*3/uL (ref 0.0–0.1)
BASOS PCT: 0 %
EOS ABS: 0.1 10*3/uL (ref 0.0–0.7)
Eosinophils Relative: 1 %
HCT: 38.2 % — ABNORMAL LOW (ref 39.0–52.0)
HEMOGLOBIN: 12.4 g/dL — AB (ref 13.0–17.0)
Lymphocytes Relative: 16 %
Lymphs Abs: 1 10*3/uL (ref 0.7–4.0)
MCH: 29.4 pg (ref 26.0–34.0)
MCHC: 32.5 g/dL (ref 30.0–36.0)
MCV: 90.5 fL (ref 78.0–100.0)
MONOS PCT: 7 %
Monocytes Absolute: 0.4 10*3/uL (ref 0.1–1.0)
NEUTROS PCT: 76 %
Neutro Abs: 4.6 10*3/uL (ref 1.7–7.7)
Platelets: 167 10*3/uL (ref 150–400)
RBC: 4.22 MIL/uL (ref 4.22–5.81)
RDW: 13.2 % (ref 11.5–15.5)
WBC: 6 10*3/uL (ref 4.0–10.5)

## 2017-09-13 LAB — BASIC METABOLIC PANEL
ANION GAP: 8 (ref 5–15)
BUN: 16 mg/dL (ref 6–20)
CALCIUM: 8.7 mg/dL — AB (ref 8.9–10.3)
CHLORIDE: 109 mmol/L (ref 101–111)
CO2: 24 mmol/L (ref 22–32)
CREATININE: 0.93 mg/dL (ref 0.61–1.24)
GFR calc non Af Amer: >60 mL/min (ref >60)
Glucose, Bld: 122 mg/dL — ABNORMAL HIGH (ref 65–99)
Potassium: 4 mmol/L (ref 3.5–5.1)
SODIUM: 141 mmol/L (ref 135–145)

## 2017-09-13 LAB — LEGIONELLA PNEUMOPHILA SEROGP 1 UR AG: L. PNEUMOPHILA SEROGP 1 UR AG: NEGATIVE

## 2017-09-13 LAB — EXPECTORATED SPUTUM ASSESSMENT W REFEX TO RESP CULTURE

## 2017-09-13 LAB — GLUCOSE, CAPILLARY
Glucose-Capillary: 119 mg/dL — ABNORMAL HIGH (ref 65–99)
Glucose-Capillary: 96 mg/dL (ref 65–99)

## 2017-09-13 LAB — EXPECTORATED SPUTUM ASSESSMENT W GRAM STAIN, RFLX TO RESP C

## 2017-09-13 MED ORDER — AZITHROMYCIN 250 MG PO TABS
500.0000 mg | ORAL_TABLET | Freq: Every day | ORAL | Status: DC
  Administered 2017-09-13: 500 mg via ORAL
  Filled 2017-09-13: qty 2

## 2017-09-13 MED ORDER — AMOXICILLIN-POT CLAVULANATE 875-125 MG PO TABS: 1.0000 | ORAL_TABLET | Freq: Two times a day (BID) | ORAL | 0 refills | Status: DC

## 2017-09-13 NOTE — Telephone Encounter (Signed)
ERROR

## 2017-09-13 NOTE — Progress Notes (Signed)
Assessment unchanged. Pt and wife verbalized understanding of dc instructions through teach back including follow up care and when to call the doctor. No scripts. Discharged via wc to front entrance accompanied by wife and NT and nurse.

## 2017-09-13 NOTE — Discharge Summary (Signed)
Discharge Summary  Robert Norman [facility] VEL:[id number] DOB: 1946/05/19  PCP: [name], DO  Admit date: 09/12/2017 Discharge date: 09/13/2017  Time spent: 25 mins  Recommendations for Outpatient Follow-up:  1. PCP  Discharge Diagnoses:  Active Hospital Problems   Diagnosis Date Noted  . Pneumonia 09/12/2017  . Nausea & vomiting 09/12/2017  . Controlled type 2 diabetes mellitus without complication ([place])   . Asthma     Resolved Hospital Problems  No resolved problems to display.    Discharge Condition: Stable  Diet recommendation: Heart healthy  Vitals:   09/13/17 0614 09/13/17 0853  BP: 111/80   Pulse: 70   Resp: 16   Temp: 98.9 F (37.2 C)   SpO2: 98% 98%    History of present illness:  Robert Ginger Whiteis a 72 y.o.malewith medical history significant for asthma, type 2 diabetes mellitus, OSA on CPAP, now presenting to the emergency department for evaluation of productive cough, shortness of breath, general malaise, subjective fevers/chills, nausea with episodes of vomiting (over 24H) for the past couple of days. In the ED, noted to be tachypneic, and with vitals otherwise stable, leukocytosis of 20,000. Chest x-ray is notable for retrocardiac airspace disease concerning for CAP. Pt admitted for further management  Today, patient reported feeling much better. Denies any chest pain, worsening shortness of breath, abdominal pain, fever/chills. Pt stable for discharge with follow up with PCP   Hospital Course:  Principal Problem:   Pneumonia Active Problems:   Controlled type 2 diabetes mellitus without complication (HCC)   Asthma   Nausea & vomiting   CAP Improved Afebrile with resolved leukocytosis Urine Strep pneumo negative, Legionella pending (PCP to follow up) BC X 2 pending, PCP to follow up CXR shows retrocardiac airspace opacity Received IV Rocephin and azithromycin X 2 days, will discharge pt on PO Augmentin for 5 more days, for a total  of 7 days of antibiotic  Asthma  Stable, no wheezing noted Continue home inhalers  DM type 2 Last A1c 6.1 Continue home metformin  Hyperlipidemia Continue statins  OSA Compliant with CPAP   Procedures:  None  Consultations:  None  Discharge Exam: BP 111/80 (BP Location: Left Arm)   Pulse 70   Temp 98.9 F (37.2 C)   Resp 16   Ht 5\' 11"  (1.803 m)   Wt 90.7 kg (200 lb)   SpO2 98%   BMI 27.89 kg/m   General: NAD Cardiovascular: S1, S2 present  Respiratory: CTAB   Discharge Instructions You were cared for by a hospitalist during your hospital stay. If you have any questions about your discharge medications or the care you received while you were in the hospital after you are discharged, you can call the unit and asked to speak with the hospitalist on call if the hospitalist that took care of you is not available. Once you are discharged, your primary care physician will handle any further medical issues. Please note that NO REFILLS for any discharge medications will be authorized once you are discharged, as it is imperative that you return to your primary care physician (or establish a relationship with a primary care physician if you do not have one) for your aftercare needs so that they can reassess your need for medications and monitor your lab values.  Discharge Instructions    Diet - low sodium heart healthy   Complete by:  As directed    Increase activity slowly   Complete by:  As directed  Allergies as of 09/13/2017      Reactions   Tetracyclines & Related Other (See Comments)   hepatitis   Mucinex [guaifenesin Er] Other (See Comments)   Irritability, nausea   Niacin And Related Other (See Comments)   headache   Wellbutrin [bupropion] Other (See Comments)   dizziness      Medication List    TAKE these medications   albuterol 108 (90 Base) MCG/ACT inhaler Commonly known as:  PROVENTIL HFA;VENTOLIN HFA Inhale 1 puff into the lungs 2 (two)  times daily.   amoxicillin-clavulanate 875-125 MG tablet Commonly known as:  AUGMENTIN Take 1 tablet by mouth 2 (two) times daily for 5 days. Start taking on:  09/14/2017   Co Q 10 100 MG Caps Take 2 capsules by mouth daily.   fluticasone 50 MCG/ACT nasal spray Commonly known as:  FLONASE Place 1 spray into both nostrils as needed for allergies or rhinitis.   Fluticasone-Salmeterol 250-50 MCG/DOSE Aepb Commonly known as:  ADVAIR Inhale 1 puff into the lungs 2 (two) times daily.   metFORMIN 500 MG 24 hr tablet Commonly known as:  GLUCOPHAGE-XR Take 500 mg by mouth daily with breakfast.   polyethylene glycol packet Commonly known as:  MIRALAX / GLYCOLAX Take 17 g by mouth daily.   simvastatin 40 MG tablet Commonly known as:  ZOCOR Take 40 mg by mouth daily.   tadalafil 20 MG tablet Commonly known as:  CIALIS Take 20 mg by mouth daily as needed for erectile dysfunction.   VALTREX 1000 MG tablet Generic drug:  valACYclovir Take 1,000 mg by mouth as needed (fever blister).   vitamin B-12 500 MCG tablet Commonly known as:  CYANOCOBALAMIN Take 500 mcg by mouth daily.   Vitamin D (Ergocalciferol) 50000 units Caps capsule Commonly known as:  DRISDOL Take 50,000 Units by mouth every 14 (fourteen) days.      Allergies  Allergen Reactions  . Tetracyclines & Related Other (See Comments)    hepatitis  . Mucinex [Guaifenesin Er] Other (See Comments)    Irritability, nausea  . Niacin And Related Other (See Comments)    headache  . Wellbutrin [Bupropion] Other (See Comments)    dizziness   Follow-up Information    Reed, Tiffany L, DO. Schedule an appointment as soon as possible for a visit in 1 week(s).   Specialty:  Geriatric Medicine Contact information: [place] [address] [phone]            The results of significant diagnostics from this hospitalization (including imaging, microbiology, ancillary and laboratory) are listed below for  reference.    Significant Diagnostic Studies: Dg Chest 2 View  Result Date: 09/12/2017 CLINICAL DATA:  Acute onset of shortness of breath, fever and generalized weakness. EXAM: CHEST - 2 VIEW COMPARISON:  None. FINDINGS: The lungs are well-aerated. Retrocardiac airspace opacity raises concern for pneumonia. There is no evidence of pleural effusion or pneumothorax. The heart is normal in size; the mediastinal contour is within normal limits. No acute osseous abnormalities are seen. IMPRESSION: Retrocardiac airspace opacity raises concern for pneumonia. Electronically Signed   By: Garald Balding M.D.   On: 09/12/2017 03:10    Microbiology: Recent Results (from the past 240 hour(s))  Culture, sputum-assessment     Status: None   Collection Time: 09/13/17  7:30 AM  Result Value Ref Range Status   Specimen Description SPUTUM  Final   Special Requests NONE  Final   Sputum evaluation   Final  THIS SPECIMEN IS ACCEPTABLE FOR SPUTUM CULTURE Performed at [facility], [place] [address], [place], [place] [address]    Report Status 09/13/2017 FINAL  Final     Labs: Basic Metabolic Panel: Recent Labs  Lab 09/12/17 0457 09/13/17 0434  NA 136 141  K 4.3 4.0  CL 103 109  CO2 23 24  GLUCOSE 129* 122*  BUN 19 16  CREATININE 1.14 0.93  CALCIUM 9.3 8.7*   Liver Function Tests: No results for input(s): AST, ALT, ALKPHOS, BILITOT, PROT, ALBUMIN in the last 168 hours. No results for input(s): LIPASE, AMYLASE in the last 168 hours. No results for input(s): AMMONIA in the last 168 hours. CBC: Recent Labs  Lab 09/12/17 0457 09/13/17 0434  WBC 20.5* 6.0  NEUTROABS 18.2* 4.6  HGB 13.8 12.4*  HCT 41.4 38.2*  MCV 89.0 90.5  PLT 179 167   Cardiac Enzymes: No results for input(s): CKTOTAL, CKMB, CKMBINDEX, TROPONINI in the last 168 hours. BNP: BNP (last 3 results) No results for input(s): BNP in the last 8760 hours.  ProBNP (last 3 results) No results for input(s):  PROBNP in the last 8760 hours.  CBG: Recent Labs  Lab 09/12/17 0835 09/12/17 1208 09/12/17 1612 09/12/17 2042 09/13/17 0720  GLUCAP 129* 138* 137* 152* 119*       Signed:  Alma Friendly, MD [facility] 09/13/2017, 11:15 AM

## 2017-09-13 NOTE — Progress Notes (Signed)
PHARMACIST - PHYSICIAN COMMUNICATION DR:   Horris Latino CONCERNING: Antibiotic IV to Oral Route Change Policy  RECOMMENDATION: This patient is receiving azithromycin by the intravenous route.  Based on criteria approved by the Pharmacy and Therapeutics Committee, the antibiotic(s) is/are being converted to the equivalent oral dose form(s).   DESCRIPTION: These criteria include:  Patient being treated for a respiratory tract infection, urinary tract infection, cellulitis or clostridium difficile associated diarrhea if on metronidazole  The patient is not neutropenic and does not exhibit a GI malabsorption state  The patient is eating (either orally or via tube) and/or has been taking other orally administered medications for a least 24 hours  The patient is improving clinically and has a Tmax < 100.5  If you have questions about this conversion, please contact the Pharmacy Department  []   [phone] )  [facility] []   [phone] )  [facility] []   [phone] )  [facility] []   [phone] )  [facility] [x]   [phone] )  [place], PharmD, BCPS 09/13/2017 8:44 AM

## 2017-09-15 LAB — CULTURE, RESPIRATORY W GRAM STAIN: Culture: NORMAL

## 2017-09-15 LAB — CULTURE, RESPIRATORY

## 2017-09-17 ENCOUNTER — Telehealth: Payer: Self-pay

## 2017-09-17 LAB — CULTURE, BLOOD (ROUTINE X 2)
Culture: NO GROWTH
Culture: NO GROWTH
Special Requests: ADEQUATE

## 2017-09-17 NOTE — Telephone Encounter (Signed)
Transition Care Management Follow-Up Telephone Call   Date discharged and where: [facility] on 09/13/2017  How have you been since you were released from the hospital? Little coughing and taking mucinex to help. Stated he feels great in the morning but rests throughout the day and is very tired by dinnertime  Any patient concerns? None  Items Reviewed:   Meds: Y, Taking antibitoics  Allergies: Y  Dietary Changes Reviewed: Y  Functional Questionnaire:  Independent-I Dependent-D  ADLs:   Dressing- I    Eating- I   Maintaining continence- I   Transferring- I   Transportation- D   Meal Prep- I w/ assist   Managing Meds- I  Confirmed importance and Date/Time of follow-up visits scheduled: Yes, 09/19/2017 @ 8:30am   Confirmed with patient if condition worsens to call PCP or go to the Emergency Dept. Patient was given office number and encouraged to call back with questions or concerns: Yes

## 2017-09-19 ENCOUNTER — Non-Acute Institutional Stay (INDEPENDENT_AMBULATORY_CARE_PROVIDER_SITE_OTHER): Payer: Medicare Other | Admitting: Internal Medicine

## 2017-09-19 ENCOUNTER — Encounter: Payer: Self-pay | Admitting: Internal Medicine

## 2017-09-19 VITALS — BP 110/60 | HR 73 | Temp 98.1°F | Ht 72.0 in | Wt 198.0 lb

## 2017-09-19 DIAGNOSIS — J189 Pneumonia, unspecified organism: Secondary | ICD-10-CM

## 2017-09-19 DIAGNOSIS — E119 Type 2 diabetes mellitus without complications: Secondary | ICD-10-CM | POA: Diagnosis not present

## 2017-09-19 DIAGNOSIS — J4520 Mild intermittent asthma, uncomplicated: Secondary | ICD-10-CM | POA: Diagnosis not present

## 2017-09-19 DIAGNOSIS — G4730 Sleep apnea, unspecified: Secondary | ICD-10-CM | POA: Diagnosis not present

## 2017-09-19 NOTE — Progress Notes (Signed)
Location:  [facility] clinic Provider: Luverne Zerkle L. Mariea Clonts, D.O., C.M.D.  Code Status: full code Goals of Care:  Advanced Directives 09/19/2017  Does Patient Have a Medical Advance Directive? Yes  Type of Advance Directive [place]  Does patient want to make changes to medical advance directive? No - Patient declined  Copy of [place] in Chart? Yes   Chief Complaint  Patient presents with  . Transitions Of Care    09/12/2017 - 09/13/2017 (36 hours)    HPI: Patient is a 72 y.o. male seen today for hospital follow-up s/p admission from 4/17-4/18/19 with This is the third time he's had pneumonia.  The first two times, he left early from the hospital due to caring for his late wife.   He reports a pattern.  Always preceded by a lot of drainage.  He'll be coughing things up.  He thinks it's been spring each time.  Pulm referral was pending this time.  Unable to cough up his mucus adequately.  He felt good all day.  Took a walk for 1.5 hrs and had gotten over a cold.  He had used all of his [employer] supply of kleenexes.  Went to dinner.  Didn't feel great there.  By the time, he got halfway down the walkway to his apt, he realized he was in trouble.  He got extremely weak.  Got to the sidewalk to his apt and he had problems walking to the door.  Took his shoes off in his apt, took shoes off, got chills, got in bed fully clothed, put cpap on, got back up and put blankets on, then waited until gasping for breath and having chills.  Called his girlfriend and said he was going to pull the switch.  She called his son in [place] and "the colonel" took charge and called Transport planner and nurse saw him--sugar 119.   CPAP was packed up, but he was told he could not take it per ems.  He then said he was not going around 8pm.  Temp hit 104 in the ED wen he went on his own at 11pm with his girlfriend, Langley Gauss.  He wound up vomiting.  In the ED per records, he was tachypneic (he reports  short shallow breaths), wbc of 20, cxr with retrocardiac airspace disease concerning for CAP.   He realizes he should have gone when they told him 3 hrs earlier.    He was tested for strep pneumo, legionella which were negative.  Blood cultures were negative x 2.  He received IV rocephin and azithromycin x 2 days and was discharged on augmentin for 5 more days for 7 total days of abx (finished yesterday by his report).  Is taking mucinex expectorant.    His home inhalers were continued for asthma.    His metformin was continued for his diabetes with prediabetic hba1c.    Using acyclovir ointment as cold sores hit him when he left the hospital.   Feels much better.  He walked over here.  He's walking to meals.  His girlfriend took him with her to her house b/c of her cat when he got out of the hospital.  He got great service there and [facility] staff were calling him.    Discussed cleaning CPAP.  He got a Engineer, structural.  His son got him xylitol to use to prevent infections.    Past Medical History:  Diagnosis Date  . Asthma   . Calcific Achilles tendonitis  bilateral  . Controlled type 2 diabetes mellitus without complication ([place])   . DDD (degenerative disc disease), cervical   . Gallstone 09/11/2011  . GERD (gastroesophageal reflux disease)   . Hyperlipidemia    mixed  . Insomnia   . Mild cognitive impairment   . Overweight (BMI 25.0-29.9)   . Prostate cancer ([place])   . Pulmonary nodule/lesion, solitary 06/2004   f/u in 2014, 2015 stable in [facility]  . Sleep apnea   . Slow transit constipation   . Vitamin D deficiency     Past Surgical History:  Procedure Laterality Date  . CERVICAL FUSION  1999 & 2004   implant metal plate  . CHOLECYSTECTOMY  2012  . EYE SURGERY  1997   laser vision correction  . palatouvulopasty  1995  . PROSTATE SURGERY  09/11/2011   prostatectomy for prostate cancer     Allergies  Allergen Reactions  . Tetracyclines & Related Other (See Comments)    hepatitis    . Mucinex [Guaifenesin Er] Other (See Comments)    Irritability, nausea  . Niacin And Related Other (See Comments)    headache  . Wellbutrin [Bupropion] Other (See Comments)    dizziness    Outpatient Encounter Medications as of 09/19/2017  Medication Sig  . albuterol (PROVENTIL HFA;VENTOLIN HFA) 108 (90 Base) MCG/ACT inhaler Inhale 1 puff into the lungs 2 (two) times daily.  . Coenzyme Q10 (CO Q 10) 100 MG CAPS Take 2 capsules by mouth daily.  . fluticasone (FLONASE) 50 MCG/ACT nasal spray Place 1 spray into both nostrils as needed for allergies or rhinitis.  . Fluticasone-Salmeterol (ADVAIR) 250-50 MCG/DOSE AEPB Inhale 1 puff into the lungs 2 (two) times daily.  . metFORMIN (GLUCOPHAGE-XR) 500 MG 24 hr tablet Take 500 mg by mouth daily with breakfast.  . polyethylene glycol (MIRALAX / GLYCOLAX) packet Take 17 g by mouth daily.  . simvastatin (ZOCOR) 40 MG tablet Take 40 mg by mouth daily.  . tadalafil (CIALIS) 20 MG tablet Take 20 mg by mouth daily as needed for erectile dysfunction.  . valACYclovir (VALTREX) 1000 MG tablet Take 1,000 mg by mouth as needed (fever blister).   . vitamin B-12 (CYANOCOBALAMIN) 500 MCG tablet Take 500 mcg by mouth daily.  . Vitamin D, Ergocalciferol, (DRISDOL) 50000 units CAPS capsule Take 50,000 Units by mouth every 14 (fourteen) days.  . [DISCONTINUED] amoxicillin-clavulanate (AUGMENTIN) 875-125 MG tablet Take 1 tablet by mouth 2 (two) times daily for 5 days.   No facility-administered encounter medications on file as of 09/19/2017.   Meds were reconciled.  Review of Systems:  Review of Systems  Constitutional: Positive for malaise/fatigue. Negative for chills and fever.  HENT: Negative for congestion.   Eyes: Negative for blurred vision.  Respiratory: Negative for shortness of breath.   Cardiovascular: Negative for chest pain and palpitations.  Gastrointestinal: Negative for abdominal pain, blood in stool, constipation, diarrhea and melena.   Genitourinary: Negative for dysuria.  Musculoskeletal: Negative for falls.  Skin: Negative for itching and rash.  Neurological: Negative for dizziness and loss of consciousness.  Psychiatric/Behavioral: Negative for depression and memory loss.    Health Maintenance  Topic Date Due  . Hepatitis C Screening  [birth year]  . URINE MICROALBUMIN  07/17/1955  . HEMOGLOBIN A1C  05/09/2017  . PNA vac Low Risk Adult (2 of 2 - PPSV23) 08/04/2017  . OPHTHALMOLOGY EXAM  10/27/2017  . FOOT EXAM  11/27/2017  . INFLUENZA VACCINE  12/27/2017  . COLONOSCOPY  05/29/2025  .  TETANUS/TDAP  05/07/2027    Physical Exam: Vitals:   09/19/17 0835  BP: 110/60  Pulse: 73  Temp: 98.1 F (36.7 C)  TempSrc: Oral  SpO2: 95%  Weight: 198 lb (89.8 kg)  Height: 6' (1.829 m)   Body mass index is 26.85 kg/m. Physical Exam  Constitutional: He is oriented to person, place, and time. He appears well-developed and well-nourished. No distress.  Cardiovascular: Normal rate, regular rhythm, normal heart sounds and intact distal pulses.  Pulmonary/Chest: Effort normal and breath sounds normal. No respiratory distress.  Abdominal: Soft. Bowel sounds are normal.  Musculoskeletal: Normal range of motion.  Neurological: He is alert and oriented to person, place, and time.  Skin: Skin is warm and dry.  Psychiatric: He has a normal mood and affect.    Labs reviewed: Basic Metabolic Panel: Recent Labs    11/07/16 09/12/17 0457 09/13/17 0434  NA 136* 136 141  K 4.6 4.3 4.0  CL  --  103 109  CO2  --  23 24  GLUCOSE  --  129* 122*  BUN 16 19 16   CREATININE 1.1 1.14 0.93  CALCIUM  --  9.3 8.7*  TSH 2.40  --   --    Liver Function Tests: Recent Labs    11/07/16  AST 20  ALT 20  ALKPHOS 47   No results for input(s): LIPASE, AMYLASE in the last 8760 hours. No results for input(s): AMMONIA in the last 8760 hours. CBC: Recent Labs    11/07/16 09/12/17 0457 09/13/17 0434  WBC 4.4 20.5* 6.0  NEUTROABS 3  18.2* 4.6  HGB 14.6 13.8 12.4*  HCT 45 41.4 38.2*  MCV  --  89.0 90.5  PLT 174 179 167   Lipid Panel: Recent Labs    11/07/16  CHOL 148  HDL 84*  LDLCALC 70  TRIG 70   Lab Results  Component Value Date   HGBA1C 6.1 11/07/2016    Procedures since last visit: Dg Chest 2 View  Result Date: 09/12/2017 CLINICAL DATA:  Acute onset of shortness of breath, fever and generalized weakness. EXAM: CHEST - 2 VIEW COMPARISON:  None. FINDINGS: The lungs are well-aerated. Retrocardiac airspace opacity raises concern for pneumonia. There is no evidence of pleural effusion or pneumothorax. The heart is normal in size; the mediastinal contour is within normal limits. No acute osseous abnormalities are seen. IMPRESSION: Retrocardiac airspace opacity raises concern for pneumonia. Electronically Signed   By: Garald Balding M.D.   On: 09/12/2017 03:10    Assessment/Plan 1. Mild intermittent asthma without complication -had recent pneumonia and two prior pneumonias -counseled on cleaning his cpap and actually just bought the "so clean" device to do this -cont advair and albuterol pending consult - Ambulatory referral to Pulmonology  2. Pneumonia of left lung due to infectious organism, unspecified part of lung -clinically resolved after hospitalization and completion of abx, feels much better outside of residual fatigue  3. Controlled type 2 diabetes mellitus without complication, without long-term current use of insulin (HCC) -cont metformin, diet, exercise  4. Sleep apnea, unspecified type -cont cpap  Labs/tests ordered:  No new, keep regular labs and appt in July Next appt:  12/05/2017  Edla Para L. Maisyn Nouri, D.O. [facility] [address], [place] [address] Cell Phone (Mon-Fri 8am-5pm):  [phone] On Call:  [phone] & follow prompts after 5pm & weekends Office Phone:  [phone] Office Fax:  [phone]

## 2017-10-03 ENCOUNTER — Other Ambulatory Visit: Payer: Self-pay | Admitting: Internal Medicine

## 2017-10-08 ENCOUNTER — Other Ambulatory Visit (INDEPENDENT_AMBULATORY_CARE_PROVIDER_SITE_OTHER): Payer: Medicare Other

## 2017-10-08 ENCOUNTER — Encounter: Payer: Self-pay | Admitting: Pulmonary Disease

## 2017-10-08 ENCOUNTER — Ambulatory Visit (INDEPENDENT_AMBULATORY_CARE_PROVIDER_SITE_OTHER): Payer: Medicare Other | Admitting: Pulmonary Disease

## 2017-10-08 ENCOUNTER — Telehealth: Payer: Self-pay | Admitting: Pulmonary Disease

## 2017-10-08 VITALS — BP 124/76 | HR 88 | Ht 71.0 in | Wt 198.8 lb

## 2017-10-08 DIAGNOSIS — R0602 Shortness of breath: Secondary | ICD-10-CM

## 2017-10-08 DIAGNOSIS — J4540 Moderate persistent asthma, uncomplicated: Secondary | ICD-10-CM

## 2017-10-08 LAB — CBC WITH DIFFERENTIAL/PLATELET
Basophils Absolute: 0 10*3/uL (ref 0.0–0.1)
Basophils Relative: 0.4 % (ref 0.0–3.0)
Eosinophils Absolute: 0.1 10*3/uL (ref 0.0–0.7)
Eosinophils Relative: 1.5 % (ref 0.0–5.0)
HEMATOCRIT: 42.7 % (ref 39.0–52.0)
Hemoglobin: 14.7 g/dL (ref 13.0–17.0)
LYMPHS ABS: 1 10*3/uL (ref 0.7–4.0)
LYMPHS PCT: 19.5 % (ref 12.0–46.0)
MCHC: 34.4 g/dL (ref 30.0–36.0)
MCV: 88.6 fl (ref 78.0–100.0)
MONOS PCT: 11.4 % (ref 3.0–12.0)
Monocytes Absolute: 0.6 10*3/uL (ref 0.1–1.0)
NEUTROS PCT: 67.2 % (ref 43.0–77.0)
Neutro Abs: 3.4 10*3/uL (ref 1.4–7.7)
Platelets: 176 10*3/uL (ref 150.0–400.0)
RBC: 4.81 Mil/uL (ref 4.22–5.81)
RDW: 13.6 % (ref 11.5–15.5)
WBC: 5.1 10*3/uL (ref 4.0–10.5)

## 2017-10-08 LAB — NITRIC OXIDE: NITRIC OXIDE: 16

## 2017-10-08 NOTE — Progress Notes (Addendum)
Robert Norman [place]    [id number]    [birth year]  Primary Care Physician:Reed, Rexene Edison, DO  Referring Physician: [name], DO [place], [place] [address]  Chief complaint: Consult for asthma  HPI: 72 year old with history of asthma, diabetes, prostate cancer, severe sleep apnea.  He was diagnosed with asthma around 2014.  Previously followed in [place] by Dr. [name].  Maintained on Breo which helps with the symptoms.  Is also using albuterol as needed.  He has history of seasonal allergies and was given steroid nasal spray which did not help with symptoms.  Reports having PFTs about 5 years ago.  Chief complaint is dyspnea on exertion.  He gets short of breath while walking up an incline.  He has constant cough productive of [place] mucus.  Denies any wheezing, chest congestion, chest pain, hemoptysis.  He said 3 episodes of pneumonia for the past 2 years.  Recently admitted in mid March 2019 for pneumonia with chest x-ray showing retrocardiac opacity.  He was treated for community-acquired pneumonia with IV Rocephin and azithromycin.  Discharged on p.o. Augmentin.  Urine strep pneumo and Legionella was negative. History noted for severe OSA.  He is currently on CPAP with good compliance.  Pets: No pets, no exposure to birds, farm animals Occupation: He was in [employer] and reports exposure to asbestos.  He later worked in the Systems analyst Exposures: Exposure to asbestos.  No mold, dampness, hot tub, Jacuzzi Smoking history: Never smoker Travel history: Lived in [place] in [place].  He was posted in [place] while in the [employer]. Relevant family history: Significant family history of lung cancer in the parents, OSA.  Outpatient Encounter Medications as of 10/08/2017  Medication Sig  . albuterol (PROVENTIL HFA;VENTOLIN HFA) 108 (90 Base) MCG/ACT inhaler Inhale 1 puff into the lungs 2 (two) times daily.  . Coenzyme Q10 (CO Q 10) 100 MG CAPS Take 2  capsules by mouth daily.  . fluticasone (FLONASE) 50 MCG/ACT nasal spray Place 1 spray into both nostrils as needed for allergies or rhinitis.  . Fluticasone-Salmeterol (ADVAIR) 250-50 MCG/DOSE AEPB Inhale 1 puff into the lungs 2 (two) times daily.  . metFORMIN (GLUCOPHAGE-XR) 500 MG 24 hr tablet Take 500 mg by mouth daily with breakfast.  . polyethylene glycol (MIRALAX / GLYCOLAX) packet Take 17 g by mouth daily.  . simvastatin (ZOCOR) 40 MG tablet TAKE 1 TABLET ONCE DAILY IN THE EVENING.  . tadalafil (CIALIS) 20 MG tablet Take 20 mg by mouth daily as needed for erectile dysfunction.  . valACYclovir (VALTREX) 1000 MG tablet Take 1,000 mg by mouth as needed (fever blister).   . vitamin B-12 (CYANOCOBALAMIN) 500 MCG tablet Take 500 mcg by mouth daily.  . Vitamin D, Ergocalciferol, (DRISDOL) 50000 units CAPS capsule Take 50,000 Units by mouth every 14 (fourteen) days.   No facility-administered encounter medications on file as of 10/08/2017.     Allergies as of 10/08/2017 - Review Complete 10/08/2017  Allergen Reaction Noted  . Tetracyclines & related Other (See Comments) 07/17/2017  . Mucinex [guaifenesin er] Other (See Comments) 07/17/2017  . Niacin and related Other (See Comments) 07/17/2017  . Wellbutrin [bupropion] Other (See Comments) 07/17/2017    Past Medical History:  Diagnosis Date  . Asthma   . Calcific Achilles tendonitis    bilateral  . Controlled type 2 diabetes mellitus without complication ([place])   . DDD (degenerative disc disease), cervical   . Gallstone  09/11/2011  . GERD (gastroesophageal reflux disease)   . Hyperlipidemia    mixed  . Insomnia   . Mild cognitive impairment   . Overweight (BMI 25.0-29.9)   . Prostate cancer ([place])   . Pulmonary nodule/lesion, solitary 06/2004   f/u in 2014, 2015 stable in [facility]  . Sleep apnea   . Slow transit constipation   . Vitamin D deficiency     Past Surgical History:  Procedure Laterality Date  . CERVICAL FUSION  1999 &  2004   implant metal plate  . CHOLECYSTECTOMY  2012  . EYE SURGERY  1997   laser vision correction  . palatouvulopasty  1995  . PROSTATE SURGERY  09/11/2011   prostatectomy for prostate cancer     Family History  Problem Relation Age of Onset  . Cancer Mother   . Heart disease Father   . Heart disease Brother   . Diabetes Brother     Social History   Socioeconomic History  . Marital status: Widowed    Spouse name: Not on file  . Number of children: Not on file  . Years of education: Not on file  . Highest education level: Not on file  Occupational History  . Occupation: Press photographer    Comment: Agricultural consultant  Social Needs  . Financial resource strain: Not hard at all  . Food insecurity:    Worry: Not on file    Inability: Not on file  . Transportation needs:    Medical: No    Non-medical: No  Tobacco Use  . Smoking status: Never Smoker  . Smokeless tobacco: Never Used  Substance and Sexual Activity  . Alcohol use: Yes    Comment: occ  . Drug use: No  . Sexual activity: Yes  Lifestyle  . Physical activity:    Days per week: 7 days    Minutes per session: 60 min  . Stress: Not on file  Relationships  . Social connections:    Talks on phone: More than three times a week    Gets together: More than three times a week    Attends religious service: Not on file    Active member of club or organization: Not on file    Attends meetings of clubs or organizations: Not on file    Relationship status: Widowed  . Intimate partner violence:    Fear of current or ex partner: Not on file    Emotionally abused: Not on file    Physically abused: Not on file    Forced sexual activity: Not on file  Other Topics Concern  . Not on file  Social History Narrative   Tobacco use, amount per day now: NEVER USED   Past tobacco use, amount per day: NEVER USED   How many years did you use tobacco: NONE   Alcohol use (drinks per week): 10 GLASSES OF WINE A WEEK   Diet:   Do  you drink/eat things with caffeine: YES   Marital status:  WIDOWED                    What year were you married?   Do you live in a house, apartment, assisted living, condo, trailer, etc.? APARTMENT   Is it one or more stories? ONE STORY   How many persons live in your home? NONE   Do you have pets in your home?( please list) NO   Current or past profession: FINANCIAL   Do you exercise?  YES                                Type and how often? WALKING 1-2 HOURS   Do you have a living will? YES   Do you have a DNR form?    NO                               If not, do you want to discuss one?   Do you have signed POA/HPOA forms?   YES                     If so, please bring to you appointment    Review of systems: Review of Systems  Constitutional: Negative for fever and chills.  HENT: Negative.   Eyes: Negative for blurred vision.  Respiratory: as per HPI  Cardiovascular: Negative for chest pain and palpitations.  Gastrointestinal: Negative for vomiting, diarrhea, blood per rectum. Genitourinary: Negative for dysuria, urgency, frequency and hematuria.  Musculoskeletal: Negative for myalgias, back pain and joint pain.  Skin: Negative for itching and rash.  Neurological: Negative for dizziness, tremors, focal weakness, seizures and loss of consciousness.  Endo/Heme/Allergies: Negative for environmental allergies.  Psychiatric/Behavioral: Negative for depression, suicidal ideas and hallucinations.  All other systems reviewed and are negative.  Physical Exam: Blood pressure 124/76, pulse 88, height 5\' 11"  (1.803 m), weight 198 lb 12.8 oz (90.2 kg), SpO2 96 %. Gen:      No acute distress HEENT:  EOMI, sclera anicteric Neck:     No masses; no thyromegaly Lungs:    Clear to auscultation bilaterally; normal respiratory effort CV:         Regular rate and rhythm; no murmurs Abd:      + bowel sounds; soft, non-tender; no palpable masses, no distension Ext:    No edema; adequate peripheral  perfusion Skin:      Warm and dry; no rash Neuro: alert and oriented x 3 Psych: normal mood and affect  Data Reviewed: FENO 10/08/2017-16  Chest x-ray 09/12/2017-retrocardiac opacity suggestive of pneumonia.  I have reviewed the images personally.  CBC 09/13/2017-WBC 6, eos 1%, absolute eosinophil count 60  Assessment:  Consult for asthma Symptoms are not very typical of asthma.  We will get records from his previous pulmonologist for review Given his history of recurrent pneumonias we will evaluate for any inflammatory process of the lung.  He also need evaluation for lung fibrosis given exposure of asbestos. Check CBC differential, blood allergy profile, ANA, rheumatoid factor, CCP, quantitative immunoglobulin.  I will schedule him for high-resolution CT and pulmonary function test  Advised him to continue Breo inhaler for now pending review of this test and records from his pulmonologist.  Plan/Recommendations: - CBC differential, blood allergy profile, ANA, rheumatoid factor, CCP, quant immunoglobulins - High res CT, PFTs  - Get records from pulmonologist - Manlius MD [facility] 10/08/2017, 3:52 PM  CC: [name], DO  Addendum Received note from [facility], pulmonary 04/17/2017 Treated for chronic cough caused by asthma.  Rx- Breo inhaler, albuterol rescue inhaler No PFTs on record.  Received labs from [facility] 08/03/1694 Metabolic panel, hepatic panel within normal limits CBC within normal limits Angiotensin-converting enzyme-70 [reference range 9-67]  Marshell Garfinkel MD [facility] 01/09/2018, 12:36 PM

## 2017-10-08 NOTE — Patient Instructions (Addendum)
We will get some blood test today including CBC with differential, blood allergy profile, ANA, rheumatoid factor, CCP, quantitative immunoglobulins Schedule you for high-resolution CT and pulmonary function test We will get records from Dr. Janee Morn office. Using the [facility] inhaler for now Return to clinic in 1 month for review

## 2017-10-08 NOTE — Telephone Encounter (Signed)
[facility]/PFT/labs. Will await records.

## 2017-10-10 LAB — RESPIRATORY ALLERGY PROFILE REGION II ~~LOC~~
Allergen, Cedar tree, t12: <0.1 kU/L
Allergen, D pternoyssinus,d7: <0.1 kU/L
Allergen, Mouse Urine Protein, e78: <0.1 kU/L
Allergen, Mulberry, t76: <0.1 kU/L
Allergen, Oak,t7: <0.1 kU/L
Aspergillus fumigatus, m3: <0.1 kU/L
Box Elder IgE: <0.1 kU/L
CLADOSPORIUM HERBARUM (M2) IGE: <0.1 kU/L
CLASS: 0
CLASS: 0
CLASS: 0
CLASS: 0
CLASS: 0
CLASS: 0
CLASS: 0
CLASS: 0
CLASS: 0
CLASS: 0
CLASS: 0
CLASS: 0
CLASS: 0
Class: 0
Class: 0
Class: 0
Class: 0
Class: 0
Class: 0
Class: 0
Class: 0
Class: 0
Class: 0
Class: 0
Cockroach: <0.1 kU/L
D. farinae: <0.1 kU/L
Dog Dander: <0.1 kU/L
IGE (IMMUNOGLOBULIN E), SERUM: 2 kU/L (ref <=114)
Rough Pigweed  IgE: <0.1 kU/L
Sheep Sorrel IgE: <0.1 kU/L
Timothy Grass: <0.1 kU/L

## 2017-10-10 LAB — ANA,IFA RA DIAG PNL W/RFLX TIT/PATN
Anti Nuclear Antibody(ANA): NEGATIVE
Rhuematoid fact SerPl-aCnc: <14 IU/mL (ref <14)

## 2017-10-10 LAB — INTERPRETATION:

## 2017-10-10 LAB — IGG, IGA, IGM
IgG (Immunoglobin G), Serum: 1187 mg/dL (ref 694–1618)
IgM, Serum: 52 mg/dL (ref 48–271)
Immunoglobulin A: 140 mg/dL (ref 81–463)

## 2017-10-16 DIAGNOSIS — R52 Pain, unspecified: Secondary | ICD-10-CM | POA: Diagnosis not present

## 2017-10-16 DIAGNOSIS — L821 Other seborrheic keratosis: Secondary | ICD-10-CM | POA: Diagnosis not present

## 2017-10-16 DIAGNOSIS — L570 Actinic keratosis: Secondary | ICD-10-CM | POA: Diagnosis not present

## 2017-10-16 DIAGNOSIS — B079 Viral wart, unspecified: Secondary | ICD-10-CM | POA: Diagnosis not present

## 2017-10-17 NOTE — Telephone Encounter (Signed)
Records have been received and placed in Dr. [name]'s cubby for review.  Nothing further is needed.  

## 2017-10-18 ENCOUNTER — Inpatient Hospital Stay: Admission: RE | Admit: 2017-10-18 | Payer: Medicare Other | Source: Ambulatory Visit

## 2017-10-19 ENCOUNTER — Ambulatory Visit (INDEPENDENT_AMBULATORY_CARE_PROVIDER_SITE_OTHER)
Admission: RE | Admit: 2017-10-19 | Discharge: 2017-10-19 | Disposition: A | Discharge status: Home / Self Care | Payer: Medicare Other | Source: Ambulatory Visit | Attending: Pulmonary Disease | Admitting: Pulmonary Disease

## 2017-10-19 DIAGNOSIS — R918 Other nonspecific abnormal finding of lung field: Secondary | ICD-10-CM | POA: Diagnosis not present

## 2017-10-19 DIAGNOSIS — R0602 Shortness of breath: Secondary | ICD-10-CM

## 2017-10-30 ENCOUNTER — Telehealth: Payer: Self-pay | Admitting: Pulmonary Disease

## 2017-10-30 ENCOUNTER — Other Ambulatory Visit: Payer: Self-pay | Admitting: Pulmonary Disease

## 2017-10-30 DIAGNOSIS — R911 Solitary pulmonary nodule: Secondary | ICD-10-CM

## 2017-10-30 NOTE — Telephone Encounter (Signed)
LMTCB

## 2017-10-31 NOTE — Telephone Encounter (Signed)
Pt spoke with margie yesterday to discuss ct chest results- see result note for further documentation.  Will close this encounter.

## 2017-11-08 ENCOUNTER — Telehealth: Payer: Self-pay | Admitting: Pulmonary Disease

## 2017-11-08 NOTE — Telephone Encounter (Signed)
Spoke with patient, advised him that per Dr. [name] he wants the HRCT due to finding lung nodules that needed further evaluation. Patient verbalized understanding. Nothing further needed.

## 2017-11-09 ENCOUNTER — Encounter (HOSPITAL_COMMUNITY)
Admission: RE | Admit: 2017-11-09 | Discharge: 2017-11-09 | Disposition: A | Discharge status: Home / Self Care | Payer: Medicare Other | Source: Ambulatory Visit | Attending: Pulmonary Disease | Admitting: Pulmonary Disease

## 2017-11-09 ENCOUNTER — Telehealth: Payer: Self-pay | Admitting: Pulmonary Disease

## 2017-11-09 DIAGNOSIS — R911 Solitary pulmonary nodule: Secondary | ICD-10-CM

## 2017-11-09 DIAGNOSIS — R590 Localized enlarged lymph nodes: Secondary | ICD-10-CM

## 2017-11-09 DIAGNOSIS — C801 Malignant (primary) neoplasm, unspecified: Secondary | ICD-10-CM | POA: Diagnosis not present

## 2017-11-09 DIAGNOSIS — C7800 Secondary malignant neoplasm of unspecified lung: Secondary | ICD-10-CM | POA: Diagnosis not present

## 2017-11-09 DIAGNOSIS — R918 Other nonspecific abnormal finding of lung field: Secondary | ICD-10-CM | POA: Diagnosis not present

## 2017-11-09 LAB — GLUCOSE, CAPILLARY: Glucose-Capillary: 120 mg/dL — ABNORMAL HIGH (ref 65–99)

## 2017-11-09 MED ORDER — FLUDEOXYGLUCOSE F - 18 (FDG) INJECTION
9.9000 | Freq: Once | INTRAVENOUS | Status: AC | PRN
  Administered 2017-11-09: 9.9 via INTRAVENOUS

## 2017-11-09 NOTE — Addendum Note (Signed)
Addended by: Della Goo C on: 11/09/2017 04:10 PM   Modules accepted: Orders

## 2017-11-09 NOTE — Telephone Encounter (Signed)
Called and spoke with Robert Norman. Reviewed PET scan which shows uptake at multiple sites concerning for malignancy.  Please order US guided biopsy by IR of the neck lymph nodes for workup.

## 2017-11-09 NOTE — Telephone Encounter (Signed)
   Notes recorded by Marshell Garfinkel, MD on 11/09/2017 at 1:41 PM EDT Called a left a voice mail requesting call back to discuss results of scan.       Dr. [name], pt is returning call about PEt scan.

## 2017-11-09 NOTE — Telephone Encounter (Signed)
Order has been placed. Nothing further needed. 

## 2017-11-18 ENCOUNTER — Other Ambulatory Visit: Payer: Self-pay | Admitting: Radiology

## 2017-11-19 ENCOUNTER — Other Ambulatory Visit: Payer: Self-pay | Admitting: Radiology

## 2017-11-19 ENCOUNTER — Telehealth: Payer: Self-pay | Admitting: Pulmonary Disease

## 2017-11-19 NOTE — Telephone Encounter (Signed)
Patient is having a US biopsy on 6/25 of neck lymph nodes to rule out malignancy from CT chest.   Pt is scheduled for PFT and OV on 6/26.  Pt wants to know if he needs to cancel this pft and office visit, or if he should keep the appts as scheduled.  Dr [name] please advise.  Thanks.

## 2017-11-19 NOTE — Telephone Encounter (Signed)
Left detailed message stating that we will cancel appts and will need to reschedule 1-2 mos out.  appts cancelled.

## 2017-11-19 NOTE — Telephone Encounter (Signed)
We can reschedule PFTs and office visit 1-2 month later.  Please let him know I will call him and inform him of the results of the biopsies from the second time.

## 2017-11-20 ENCOUNTER — Ambulatory Visit (HOSPITAL_COMMUNITY)
Admission: RE | Admit: 2017-11-20 | Discharge: 2017-11-20 | Disposition: A | Discharge status: Home / Self Care | Payer: Medicare Other | Source: Ambulatory Visit | Attending: Pulmonary Disease | Admitting: Pulmonary Disease

## 2017-11-20 ENCOUNTER — Encounter (HOSPITAL_COMMUNITY): Payer: Self-pay

## 2017-11-20 DIAGNOSIS — Z8546 Personal history of malignant neoplasm of prostate: Secondary | ICD-10-CM | POA: Diagnosis not present

## 2017-11-20 DIAGNOSIS — E785 Hyperlipidemia, unspecified: Secondary | ICD-10-CM | POA: Insufficient documentation

## 2017-11-20 DIAGNOSIS — Z7984 Long term (current) use of oral hypoglycemic drugs: Secondary | ICD-10-CM | POA: Insufficient documentation

## 2017-11-20 DIAGNOSIS — E559 Vitamin D deficiency, unspecified: Secondary | ICD-10-CM | POA: Insufficient documentation

## 2017-11-20 DIAGNOSIS — R590 Localized enlarged lymph nodes: Secondary | ICD-10-CM

## 2017-11-20 DIAGNOSIS — G4730 Sleep apnea, unspecified: Secondary | ICD-10-CM | POA: Diagnosis not present

## 2017-11-20 DIAGNOSIS — Z79899 Other long term (current) drug therapy: Secondary | ICD-10-CM | POA: Diagnosis not present

## 2017-11-20 DIAGNOSIS — J45909 Unspecified asthma, uncomplicated: Secondary | ICD-10-CM | POA: Diagnosis not present

## 2017-11-20 DIAGNOSIS — E119 Type 2 diabetes mellitus without complications: Secondary | ICD-10-CM | POA: Insufficient documentation

## 2017-11-20 DIAGNOSIS — Z9049 Acquired absence of other specified parts of digestive tract: Secondary | ICD-10-CM | POA: Diagnosis not present

## 2017-11-20 DIAGNOSIS — G4700 Insomnia, unspecified: Secondary | ICD-10-CM | POA: Insufficient documentation

## 2017-11-20 DIAGNOSIS — K219 Gastro-esophageal reflux disease without esophagitis: Secondary | ICD-10-CM | POA: Insufficient documentation

## 2017-11-20 DIAGNOSIS — L928 Other granulomatous disorders of the skin and subcutaneous tissue: Secondary | ICD-10-CM | POA: Diagnosis not present

## 2017-11-20 DIAGNOSIS — M7989 Other specified soft tissue disorders: Secondary | ICD-10-CM | POA: Diagnosis not present

## 2017-11-20 LAB — PROTIME-INR
INR: 1.07
Prothrombin Time: 13.8 seconds (ref 11.4–15.2)

## 2017-11-20 LAB — CBC
HCT: 44.2 % (ref 39.0–52.0)
Hemoglobin: 14.8 g/dL (ref 13.0–17.0)
MCH: 29.5 pg (ref 26.0–34.0)
MCHC: 33.5 g/dL (ref 30.0–36.0)
MCV: 88 fL (ref 78.0–100.0)
PLATELETS: 207 10*3/uL (ref 150–400)
RBC: 5.02 MIL/uL (ref 4.22–5.81)
RDW: 12.4 % (ref 11.5–15.5)
WBC: 5.9 10*3/uL (ref 4.0–10.5)

## 2017-11-20 MED ORDER — LIDOCAINE-EPINEPHRINE 1 %-1:100000 IJ SOLN
INTRAMUSCULAR | Status: AC
  Filled 2017-11-20: qty 1

## 2017-11-20 MED ORDER — MIDAZOLAM HCL 2 MG/2ML IJ SOLN
INTRAMUSCULAR | Status: AC | PRN
  Administered 2017-11-20: 1 mg via INTRAVENOUS
  Administered 2017-11-20: 0.5 mg via INTRAVENOUS

## 2017-11-20 MED ORDER — SODIUM CHLORIDE 0.9 % IV SOLN: INTRAVENOUS | Status: DC

## 2017-11-20 MED ORDER — FENTANYL CITRATE (PF) 100 MCG/2ML IJ SOLN
INTRAMUSCULAR | Status: AC
  Filled 2017-11-20: qty 2

## 2017-11-20 MED ORDER — FENTANYL CITRATE (PF) 100 MCG/2ML IJ SOLN
INTRAMUSCULAR | Status: AC | PRN
  Administered 2017-11-20: 25 ug via INTRAVENOUS
  Administered 2017-11-20: 50 ug via INTRAVENOUS

## 2017-11-20 MED ORDER — MIDAZOLAM HCL 2 MG/2ML IJ SOLN
INTRAMUSCULAR | Status: AC
  Filled 2017-11-20: qty 2

## 2017-11-20 NOTE — H&P (Signed)
Chief Complaint: Patient was seen in consultation today for right neck lymph node biopsy at the request of [facility]  Referring Physician(s): [name],Praveen  Supervising Physician: Sandi Mariscal  Patient Status: [name] - Out[facility]pt  History of Present Illness: Robert Norman is a 72 y.o. male   Hx Prostate cancer 10-12 yrs ago Hx Asthma: inhalers and Breo Recent PNA 3 x in 24 months Has had a cough on going for months to yrs Uses CPAP at home  SOB and fever Evaluation for interstitial lung disease CT 10/19/17:  IMPRESSION: 1. No findings to suggest interstitial lung disease. 2. However, there are multiple pulmonary nodules scattered throughout the lungs bilaterally. Findings are suspicious for potential metastatic disease. Further clinical evaluation is recommended. 3. Aortic atherosclerosis, in addition to left main and 3 vessel coronary artery disease. Assessment for potential risk factor modification, dietary therapy or pharmacologic therapy may be warranted, if clinically indicated.  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 No obvious primary lesion is identified. There is right-sided neck, supraclavicular and axillary adenopathy along with extensive mediastinal and hilar adenopathy and pulmonary metastatic disease. There is also metastatic adenopathy in the abdomen and suspected splenic metastasis. Melanoma would certainly be a consideration. Biopsy of the right-sided neck nodes may be obtainable under ultrasound guidance.  Now scheduled for right neck LAN biopsy   Past Medical History:  Diagnosis Date  . Asthma   . Calcific Achilles tendonitis    bilateral  . Controlled type 2 diabetes mellitus without complication ([place])   . DDD (degenerative disc disease), cervical   . Gallstone 09/11/2011  . GERD (gastroesophageal reflux disease)   . Hyperlipidemia    mixed  . Insomnia   . Mild cognitive impairment   .  Overweight (BMI 25.0-29.9)   . Prostate cancer ([place])   . Pulmonary nodule/lesion, solitary 06/2004   f/u in 2014, 2015 stable in [facility]  . Sleep apnea   . Slow transit constipation   . Vitamin D deficiency     Past Surgical History:  Procedure Laterality Date  . CERVICAL FUSION  1999 & 2004   implant metal plate  . CHOLECYSTECTOMY  2012  . EYE SURGERY  1997   laser vision correction  . palatouvulopasty  1995  . PROSTATE SURGERY  09/11/2011   prostatectomy for prostate cancer     Allergies: Tetracyclines & related; Niacin and related; and Wellbutrin [bupropion]  Medications: Prior to Admission medications   Medication Sig Start Date End Date Taking? Authorizing Provider  albuterol (PROVENTIL HFA;VENTOLIN HFA) 108 (90 Base) MCG/ACT inhaler Inhale 1 puff into the lungs every 4 (four) hours as needed for wheezing or shortness of breath.    Yes [provider]  Coenzyme Q10 (CO Q 10) 100 MG CAPS Take 2 capsules by mouth daily.   Yes [provider]  fluticasone (FLONASE) 50 MCG/ACT nasal spray Place 1 spray into both nostrils as needed for allergies or rhinitis.   Yes [provider]  Fluticasone-Salmeterol (ADVAIR) 250-50 MCG/DOSE AEPB Inhale 1 puff into the lungs 2 (two) times daily.    Yes [provider]  guaiFENesin (MUCINEX) 600 MG 12 hr tablet Take 600 mg by mouth 3 (three) times a week.   Yes [provider]  metFORMIN (GLUCOPHAGE-XR) 500 MG 24 hr tablet Take 500 mg by mouth daily with breakfast.   Yes [provider]  polyethylene glycol (MIRALAX / GLYCOLAX) packet Take 17 g by mouth daily.   Yes [provider]  simvastatin (ZOCOR) 40 MG tablet TAKE 1 TABLET ONCE DAILY IN THE EVENING. 10/03/17  Yes Reed, Tiffany L, DO  Vitamin D, Ergocalciferol, (DRISDOL) 50000 units CAPS capsule Take 50,000 Units by mouth every 14 (fourteen) days.   Yes [provider]  tadalafil (CIALIS) 20 MG tablet Take 20 mg by mouth  daily as needed for erectile dysfunction.    [provider]  valACYclovir (VALTREX) 1000 MG tablet Take 1,000 mg by mouth as needed (fever blister).     [provider]     Family History  Problem Relation Age of Onset  . Cancer Mother   . Heart disease Father   . Heart disease Brother   . Diabetes Brother     Social History   Socioeconomic History  . Marital status: Widowed    Spouse name: Not on file  . Number of children: Not on file  . Years of education: Not on file  . Highest education level: Not on file  Occupational History  . Occupation: Press photographer    Comment: Agricultural consultant  Social Needs  . Financial resource strain: Not hard at all  . Food insecurity:    Worry: Not on file    Inability: Not on file  . Transportation needs:    Medical: No    Non-medical: No  Tobacco Use  . Smoking status: Never Smoker  . Smokeless tobacco: Never Used  Substance and Sexual Activity  . Alcohol use: Yes    Comment: occ  . Drug use: No  . Sexual activity: Yes  Lifestyle  . Physical activity:    Days per week: 7 days    Minutes per session: 60 min  . Stress: Not on file  Relationships  . Social connections:    Talks on phone: More than three times a week    Gets together: More than three times a week    Attends religious service: Not on file    Active member of club or organization: Not on file    Attends meetings of clubs or organizations: Not on file    Relationship status: Widowed  Other Topics Concern  . Not on file  Social History Narrative   Tobacco use, amount per day now: NEVER USED   Past tobacco use, amount per day: NEVER USED   How many years did you use tobacco: NONE   Alcohol use (drinks per week): 10 GLASSES OF WINE A WEEK   Diet:   Do you drink/eat things with caffeine: YES   Marital status:  WIDOWED                    What year were you married?   Do you live in a house, apartment, assisted living, condo, trailer, etc.?  APARTMENT   Is it one or more stories? ONE STORY   How many persons live in your home? NONE   Do you have pets in your home?( please list) NO   Current or past profession: FINANCIAL   Do you exercise?  YES                                Type and how often? WALKING 1-2 HOURS   Do you have a living will? YES   Do you have a DNR form?    NO  If not, do you want to discuss one?   Do you have signed POA/HPOA forms?   YES                     If so, please bring to you appointment     Review of Systems: A 12 point ROS discussed and pertinent positives are indicated in the HPI above.  All other systems are negative.  Review of Systems  Constitutional: Negative for activity change, fatigue and fever.  Respiratory: Positive for cough and shortness of breath.   Gastrointestinal: Negative for abdominal pain.  Neurological: Negative for weakness.  Psychiatric/Behavioral: Negative for behavioral problems and confusion.    Vital Signs: BP 131/75   Pulse 68   Temp 98.8 F (37.1 C)   Resp 18   Ht 5\' 8"  (1.727 m)   Wt 198 lb (89.8 kg)   SpO2 98%   BMI 30.11 kg/m   Physical Exam  Constitutional: He is oriented to person, place, and time.  Cardiovascular: Normal rate, regular rhythm and normal heart sounds.  Pulmonary/Chest: Effort normal and breath sounds normal. He has no wheezes.  Abdominal: Soft. Bowel sounds are normal.  Musculoskeletal: Normal range of motion.  Neurological: He is alert and oriented to person, place, and time.  Skin: Skin is warm and dry.  Psychiatric: He has a normal mood and affect. His behavior is normal. Judgment and thought content normal.  Nursing note and vitals reviewed.   Imaging: Nm Pet Image Initial (pi) Skull Base To Thigh  Result Date: 11/09/2017 CLINICAL DATA:  Initial treatment strategy for pulmonary nodules. EXAM: NUCLEAR MEDICINE PET SKULL BASE TO THIGH TECHNIQUE: 9.9 mCi F-18 FDG was injected intravenously. Full-ring  PET imaging was performed from the skull base to thigh after the radiotracer. CT data was obtained and used for attenuation correction and anatomic localization. Fasting blood glucose: 120 mg/dl COMPARISON:  Chest CT 10/19/2017 FINDINGS: Mediastinal blood pool activity: SUV max 2.86 NECK: 7 mm level 3 lymph node on the right side on image number 42 is hypermetabolic with SUV max of 23.53. 6 mm level 3 lymph node on image number 44 has an SUV max of 7.73. Small right supraclavicular nodes are also mildly hypermetabolic with SUV max of 5.0. No neck mass or mucosal lesions are identified. Incidental CT findings: none CHEST: 7 mm right axillary lymph node is hypermetabolic with SUV max of 7.7. Extensive hypermetabolic mediastinal and hilar lymphadenopathy. Cluster of paratracheal and aorticopulmonary window nodes have an SUV max of 11.08. 12.5 mm right hilar lymph node has an SUV max of 8.67. Numerous pulmonary nodules as demonstrated on the prior CT. The larger nodules are hypermetabolic. 12 mm right upper lobe pulmonary nodule on image 38 of the lung windows has an SUV max of 6.8. 6 mm nodule in the right upper lobe on image number 22 has an SUV max of 6.64. 11 mm left lower lobe pulmonary nodule on image number 45 is an SUV max of 1.95 Incidental CT findings: none ABDOMEN/PELVIS: No focal hepatic lesions or areas of hypermetabolism. Spleen demonstrates several areas of moderate hypermetabolism without obvious lesions on the CT scan. FINDINGS till suspicious for splenic metastasis. The pancreas, adrenal glands and kidneys are unremarkable. Fairly extensive abdominal lymphadenopathy. Periportal and hepatoduodenal ligament lymph nodes are hypermetabolic. 9 mm lymph node between the duodenum and IVC on image number 125 has an SUV max of 11.6. 7 mm lymph node near the base of the celiac axis on image  number 130 has an SUV max of 7.9. Right common iliac lymph node on image number 167 measures 7.5 mm and has an SUV max of  6.35. No pelvic or inguinal lymphadenopathy. Incidental CT findings: none SKELETON: No focal hypermetabolic activity to suggest skeletal metastasis. Incidental CT findings: Calcifications around both shoulders are noted suggesting calcific tendinitis or bursitis. 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 No obvious primary lesion is identified. There is right-sided neck, supraclavicular and axillary adenopathy along with extensive mediastinal and hilar adenopathy and pulmonary metastatic disease. There is also metastatic adenopathy in the abdomen and suspected splenic metastasis. Melanoma would certainly be a consideration. Biopsy of the right-sided neck nodes may be obtainable under ultrasound guidance. Electronically Signed   By: Marijo Sanes M.D.   On: 11/09/2017 13:21    Labs:  CBC: Recent Labs    09/12/17 0457 09/13/17 0434 10/08/17 1640  WBC 20.5* 6.0 5.1  HGB 13.8 12.4* 14.7  HCT 41.4 38.2* 42.7  PLT 179 167 176.0    COAGS: No results for input(s): INR, APTT in the last 8760 hours.  BMP: Recent Labs    09/12/17 0457 09/13/17 0434  NA 136 141  K 4.3 4.0  CL 103 109  CO2 23 24  GLUCOSE 129* 122*  BUN 19 16  CALCIUM 9.3 8.7*  CREATININE 1.14 0.93  GFRNONAA >60 >60  GFRAA >60 >60    LIVER FUNCTION TESTS: No results for input(s): BILITOT, AST, ALT, ALKPHOS, PROT, ALBUMIN in the last 8760 hours.  TUMOR MARKERS: No results for input(s): AFPTM, CEA, CA199, CHROMGRNA in the last 8760 hours.  Assessment and Plan:  Cough and intermittent fever for months PNA x 3 in 2 yrs Abnormal CT revealed lung nodules-- suspicious for metastatic disease PET + with multiple sites Now scheduled for Rt neck LAN biopsy Risks and benefits discussed with the patient including, but not limited to bleeding, infection, damage to adjacent structures or low yield requiring additional tests.  All of the patient's questions were answered, patient is agreeable to  proceed. Consent signed and in chart.  Thank you for this interesting consult.  I greatly enjoyed meeting Robert Norman [place] and look forward to participating in their care.  A copy of this report was sent to the requesting provider on this date.  Electronically Signed: Lavonia Drafts, PA-C 11/20/2017, 12:30 PM   I spent a total of  30 Minutes   in face to face in clinical consultation, greater than 50% of which was counseling/coordinating care for rt neck lymph node biopsy

## 2017-11-20 NOTE — Procedures (Signed)
Pre Procedure Dx: Right cervical lymphadenopathy Post Procedural Dx: Same  Technically successful US guided biopsy of hypermetabolic right cervical LN.   EBL: None  No immediate complications.   Ronny Bacon, MD Pager #: [phone]

## 2017-11-20 NOTE — Discharge Instructions (Signed)
Needle Biopsy, Care After °Refer to this sheet in the next few weeks. These instructions provide you with information about caring for yourself after your procedure. Your health care provider may also give you more specific instructions. Your treatment has been planned according to current medical practices, but problems sometimes occur. Call your health care provider if you have any problems or questions after your procedure. °What can I expect after the procedure? °After your procedure, it is common to have soreness, bruising, or mild pain at the biopsy site. This should go away in a few days. °Follow these instructions at home: °· Rest as directed by your health care provider. °· Take medicines only as directed by your health care provider. °· There are many different ways to close and cover the biopsy site, including stitches (sutures), skin glue, and adhesive strips. Follow your health care provider's instructions about: °? Biopsy site care. °? Bandage (dressing) changes and removal. °? Biopsy site closure removal. °· Check your biopsy site every day for signs of infection. Watch for: °? Redness, swelling, or pain. °? Fluid, blood, or pus. °Contact a health care provider if: °· You have a fever. °· You have redness, swelling, or pain at the biopsy site that lasts longer than a few days. °· You have fluid, blood, or pus coming from the biopsy site. °· You feel nauseous. °· You vomit. °Get help right away if: °· You have shortness of breath. °· You have trouble breathing. °· You have chest pain. °· You feel dizzy or you faint. °· You have bleeding that does not stop with pressure or a bandage. °· You cough up blood. °· You have pain in your abdomen. °This information is not intended to replace advice given to you by your health care provider. Make sure you discuss any questions you have with your health care provider. °Document Released: 09/29/2014 Document Revised: 10/21/2015 Document Reviewed:  05/11/2014 °Elsevier Interactive Patient Education © 2018 Elsevier Inc. ° °

## 2017-11-20 NOTE — Telephone Encounter (Signed)
Called and spoke with patient regarding scheduling PFT and OV with Dr. [name]. Scheduled PFT at 3pm 12/25/2017; and f/u OV same day 4pm with Dr. [name]. Nothing further needed at this time.

## 2017-11-21 ENCOUNTER — Ambulatory Visit: Payer: PRIVATE HEALTH INSURANCE | Admitting: Pulmonary Disease

## 2017-11-23 ENCOUNTER — Telehealth: Payer: Self-pay | Admitting: Pulmonary Disease

## 2017-11-23 DIAGNOSIS — R0602 Shortness of breath: Secondary | ICD-10-CM

## 2017-11-23 NOTE — Telephone Encounter (Signed)
lmtcb x1 for nurse supervisor at [facility] regarding lab add on

## 2017-11-23 NOTE — Telephone Encounter (Signed)
Noted and will forward to [facility] to call patient.

## 2017-11-23 NOTE — Telephone Encounter (Signed)
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  We will see if it can be added to his scheduled labs at [facility]. Follow-up in clinic as scheduled to discuss results and treatment.

## 2017-11-27 ENCOUNTER — Encounter: Payer: Self-pay | Admitting: Internal Medicine

## 2017-11-27 DIAGNOSIS — E119 Type 2 diabetes mellitus without complications: Secondary | ICD-10-CM | POA: Diagnosis not present

## 2017-11-27 DIAGNOSIS — E559 Vitamin D deficiency, unspecified: Secondary | ICD-10-CM | POA: Diagnosis not present

## 2017-11-27 DIAGNOSIS — E785 Hyperlipidemia, unspecified: Secondary | ICD-10-CM | POA: Diagnosis not present

## 2017-11-27 DIAGNOSIS — K219 Gastro-esophageal reflux disease without esophagitis: Secondary | ICD-10-CM | POA: Diagnosis not present

## 2017-11-27 DIAGNOSIS — D649 Anemia, unspecified: Secondary | ICD-10-CM | POA: Diagnosis not present

## 2017-11-27 DIAGNOSIS — E663 Overweight: Secondary | ICD-10-CM | POA: Diagnosis not present

## 2017-11-27 DIAGNOSIS — Z79899 Other long term (current) drug therapy: Secondary | ICD-10-CM | POA: Diagnosis not present

## 2017-11-27 LAB — CBC AND DIFFERENTIAL
HCT: 46 (ref 41–53)
Hemoglobin: 15.1 (ref 13.5–17.5)
Platelets: 218 (ref 150–399)
WBC: 5.5

## 2017-11-27 LAB — LIPID PANEL
Cholesterol: 133 (ref 0–200)
HDL: 47 (ref 35–70)
LDL Cholesterol: 58
Triglycerides: 139 (ref 40–160)

## 2017-11-27 LAB — BASIC METABOLIC PANEL
BUN: 13 (ref 4–21)
Creatinine: 1.1 (ref 0.6–1.3)
Glucose: 154
Potassium: 4.5 (ref 3.4–5.3)
Sodium: 137 (ref 137–147)

## 2017-11-27 LAB — HEPATIC FUNCTION PANEL
ALT: 16 (ref 10–40)
AST: 17 (ref 14–40)
Alkaline Phosphatase: 61 (ref 25–125)
Bilirubin, Total: 0.7

## 2017-11-27 LAB — HEMOGLOBIN A1C: Hemoglobin A1C: 5.9

## 2017-11-27 NOTE — Telephone Encounter (Signed)
I have spoken to [facility], who stated lab orders needed to be faxed to clinic nurse at [phone]. Order has been faxed. Nothing further is needed.

## 2017-11-28 ENCOUNTER — Telehealth: Payer: Self-pay | Admitting: Internal Medicine

## 2017-11-28 ENCOUNTER — Encounter: Payer: Self-pay | Admitting: Internal Medicine

## 2017-11-28 NOTE — Telephone Encounter (Signed)
I left a message asking the patient to call me at [phone] to schedule AWV with Sara at [facility] on 12/04/17 if available. VDM (DD) °

## 2017-11-29 DIAGNOSIS — Z79899 Other long term (current) drug therapy: Secondary | ICD-10-CM | POA: Diagnosis not present

## 2017-12-04 ENCOUNTER — Non-Acute Institutional Stay: Payer: Medicare Other

## 2017-12-04 VITALS — BP 122/70 | HR 90 | Temp 97.8°F | Ht 71.0 in | Wt 204.0 lb

## 2017-12-04 DIAGNOSIS — Z0000 Encounter for general adult medical examination without abnormal findings: Secondary | ICD-10-CM | POA: Diagnosis not present

## 2017-12-04 NOTE — Patient Instructions (Signed)
Robert Norman , Thank you for taking time to come for your Medicare Wellness Visit. I appreciate your ongoing commitment to your health goals. Please review the following plan we discussed and let me know if I can assist you in the future.   Screening recommendations/referrals: Colonoscopy up to date Recommended yearly ophthalmology/optometry visit for glaucoma screening and checkup Recommended yearly dental visit for hygiene and checkup  Vaccinations: Influenza vaccine up to date, due 2019 fall season Pneumococcal vaccine 23 due Tdap vaccine up to date, due 05/07/2027 Shingles vaccine up to date, completed    Advanced directives: in chart  Conditions/risks identified: none  Next appointment: Dr. Mariea Clonts 12/05/2017 @ 10am  Preventive Care 72 Years and Older, Male Preventive care refers to lifestyle choices and visits with your health care provider that can promote health and wellness. What does preventive care include?  A yearly physical exam. This is also called an annual well check.  Dental exams once or twice a year.  Routine eye exams. Ask your health care provider how often you should have your eyes checked.  Personal lifestyle choices, including:  Daily care of your teeth and gums.  Regular physical activity.  Eating a healthy diet.  Avoiding tobacco and drug use.  Limiting alcohol use.  Practicing safe sex.  Taking low doses of aspirin every day.  Taking vitamin and mineral supplements as recommended by your health care provider. What happens during an annual well check? The services and screenings done by your health care provider during your annual well check will depend on your age, overall health, lifestyle risk factors, and family history of disease. Counseling  Your health care provider may ask you questions about your:  Alcohol use.  Tobacco use.  Drug use.  Emotional well-being.  Home and relationship well-being.  Sexual activity.  Eating  habits.  History of falls.  Memory and ability to understand (cognition).  Work and work Statistician. Screening  You may have the following tests or measurements:  Height, weight, and BMI.  Blood pressure.  Lipid and cholesterol levels. These may be checked every 5 years, or more frequently if you are over 72 years old.  Skin check.  Lung cancer screening. You may have this screening every year starting at age 40 if you have a 30-pack-year history of smoking and currently smoke or have quit within the past 15 years.  Fecal occult blood test (FOBT) of the stool. You may have this test every year starting at age 72.  Flexible sigmoidoscopy or colonoscopy. You may have a sigmoidoscopy every 5 years or a colonoscopy every 10 years starting at age 72  Prostate cancer screening. Recommendations will vary depending on your family history and other risks.  Hepatitis C blood test.  Hepatitis B blood test.  Sexually transmitted disease (STD) testing.  Diabetes screening. This is done by checking your blood sugar (glucose) after you have not eaten for a while (fasting). You may have this done every 1-3 years.  Abdominal aortic aneurysm (AAA) screening. You may need this if you are a current or former smoker.  Osteoporosis. You may be screened starting at age 72 if you are at high risk. Talk with your health care provider about your test results, treatment options, and if necessary, the need for more tests. Vaccines  Your health care provider may recommend certain vaccines, such as:  Influenza vaccine. This is recommended every year.  Tetanus, diphtheria, and acellular pertussis (Tdap, Td) vaccine. You may need a Td booster  every 10 years.  Zoster vaccine. You may need this after age 47.  Pneumococcal 13-valent conjugate (PCV13) vaccine. One dose is recommended after age 72.  Pneumococcal polysaccharide (PPSV23) vaccine. One dose is recommended after age 72. Talk to your health  care provider about which screenings and vaccines you need and how often you need them. This information is not intended to replace advice given to you by your health care provider. Make sure you discuss any questions you have with your health care provider. Document Released: 06/11/2015 Document Revised: 02/02/2016 Document Reviewed: 03/16/2015 Elsevier Interactive Patient Education  2017 [place] Prevention in the Home Falls can cause injuries. They can happen to people of all ages. There are many things you can do to make your home safe and to help prevent falls. What can I do on the outside of my home?  Regularly fix the edges of walkways and driveways and fix any cracks.  Remove anything that might make you trip as you walk through a door, such as a raised step or threshold.  Trim any bushes or trees on the path to your home.  Use bright outdoor lighting.  Clear any walking paths of anything that might make someone trip, such as rocks or tools.  Regularly check to see if handrails are loose or broken. Make sure that both sides of any steps have handrails.  Any raised decks and porches should have guardrails on the edges.  Have any leaves, snow, or ice cleared regularly.  Use sand or salt on walking paths during winter.  Clean up any spills in your garage right away. This includes oil or grease spills. What can I do in the bathroom?  Use night lights.  Install grab bars by the toilet and in the tub and shower. Do not use towel bars as grab bars.  Use non-skid mats or decals in the tub or shower.  If you need to sit down in the shower, use a plastic, non-slip stool.  Keep the floor dry. Clean up any water that spills on the floor as soon as it happens.  Remove soap buildup in the tub or shower regularly.  Attach bath mats securely with double-sided non-slip rug tape.  Do not have throw rugs and other things on the floor that can make you trip. What can I do  in the bedroom?  Use night lights.  Make sure that you have a light by your bed that is easy to reach.  Do not use any sheets or blankets that are too big for your bed. They should not hang down onto the floor.  Have a firm chair that has side arms. You can use this for support while you get dressed.  Do not have throw rugs and other things on the floor that can make you trip. What can I do in the kitchen?  Clean up any spills right away.  Avoid walking on wet floors.  Keep items that you use a lot in easy-to-reach places.  If you need to reach something above you, use a strong step stool that has a grab bar.  Keep electrical cords out of the way.  Do not use floor polish or wax that makes floors slippery. If you must use wax, use non-skid floor wax.  Do not have throw rugs and other things on the floor that can make you trip. What can I do with my stairs?  Do not leave any items on the stairs.  Make  sure that there are handrails on both sides of the stairs and use them. Fix handrails that are broken or loose. Make sure that handrails are as long as the stairways.  Check any carpeting to make sure that it is firmly attached to the stairs. Fix any carpet that is loose or worn.  Avoid having throw rugs at the top or bottom of the stairs. If you do have throw rugs, attach them to the floor with carpet tape.  Make sure that you have a light switch at the top of the stairs and the bottom of the stairs. If you do not have them, ask someone to add them for you. What else can I do to help prevent falls?  Wear shoes that:  Do not have high heels.  Have rubber bottoms.  Are comfortable and fit you well.  Are closed at the toe. Do not wear sandals.  If you use a stepladder:  Make sure that it is fully opened. Do not climb a closed stepladder.  Make sure that both sides of the stepladder are locked into place.  Ask someone to hold it for you, if possible.  Clearly mark  and make sure that you can see:  Any grab bars or handrails.  First and last steps.  Where the edge of each step is.  Use tools that help you move around (mobility aids) if they are needed. These include:  Canes.  Walkers.  Scooters.  Crutches.  Turn on the lights when you go into a dark area. Replace any light bulbs as soon as they burn out.  Set up your furniture so you have a clear path. Avoid moving your furniture around.  If any of your floors are uneven, fix them.  If there are any pets around you, be aware of where they are.  Review your medicines with your doctor. Some medicines can make you feel dizzy. This can increase your chance of falling. Ask your doctor what other things that you can do to help prevent falls. This information is not intended to replace advice given to you by your health care provider. Make sure you discuss any questions you have with your health care provider. Document Released: 03/11/2009 Document Revised: 10/21/2015 Document Reviewed: 06/19/2014 Elsevier Interactive Patient Education  2017 [employer].

## 2017-12-04 NOTE — Progress Notes (Signed)
Subjective:   Robert Norman is a 72 y.o. male who presents for Medicare Annual/Subsequent preventive examination at [facility]  Last AWV-11/26/2016    Objective:    Vitals: BP 122/70 (BP Location: Right Arm, Patient Position: Sitting)   Pulse 90   Temp 97.8 F (36.6 C) (Oral)   Ht 5\' 11"  (1.803 m)   Wt 204 lb (92.5 kg)   SpO2 98%   BMI 28.45 kg/m   Body mass index is 28.45 kg/m.  Advanced Directives 12/04/2017 11/20/2017 09/19/2017 09/12/2017 08/08/2017  Does Patient Have a Medical Advance Directive? Yes Yes Yes Yes Yes  Type of Paramedic of [place];Living will [place];Living will Healthcare Power of [place] of [place]  Does patient want to make changes to medical advance directive? No - Patient declined No - Patient declined No - Patient declined No - Patient declined No - Patient declined  Copy of [place] in Chart? Yes - Yes Yes Yes    Tobacco Social History   Tobacco Use  Smoking Status Never Smoker  Smokeless Tobacco Never Used     Counseling given: Not Answered   Clinical Intake:  Pre-visit preparation completed: No  Pain : No/denies pain     Nutritional Risks: None Diabetes: No  How often do you need to have someone help you when you read instructions, pamphlets, or other written materials from your doctor or pharmacy?: 1 - Never What is the last grade level you completed in school?: graduate school  Interpreter Needed?: No  Information entered by :: SaravSaunders, RN  Past Medical History:  Diagnosis Date  . Asthma   . Calcific Achilles tendonitis    bilateral  . Controlled type 2 diabetes mellitus without complication ([place])   . DDD (degenerative disc disease), cervical   . Gallstone 09/11/2011  . GERD (gastroesophageal reflux disease)   . Hyperlipidemia    mixed  . Insomnia   . Mild cognitive  impairment   . Overweight (BMI 25.0-29.9)   . Prostate cancer ([place])   . Pulmonary nodule/lesion, solitary 06/2004   f/u in 2014, 2015 stable in [facility]  . Sleep apnea   . Slow transit constipation   . Vitamin D deficiency    Past Surgical History:  Procedure Laterality Date  . CERVICAL FUSION  1999 & 2004   implant metal plate  . CHOLECYSTECTOMY  2012  . EYE SURGERY  1997   laser vision correction  . palatouvulopasty  1995  . PROSTATE SURGERY  09/11/2011   prostatectomy for prostate cancer    Family History  Problem Relation Age of Onset  . Cancer Mother   . Heart disease Father   . Heart disease Brother   . Diabetes Brother    Social History   Socioeconomic History  . Marital status: Widowed    Spouse name: Not on file  . Number of children: Not on file  . Years of education: Not on file  . Highest education level: Not on file  Occupational History  . Occupation: Press photographer    Comment: Agricultural consultant  Social Needs  . Financial resource strain: Not hard at all  . Food insecurity:    Worry: Never true    Inability: Never true  . Transportation needs:    Medical: No    Non-medical: No  Tobacco Use  . Smoking status: Never Smoker  . Smokeless tobacco: Never Used  Substance and  Sexual Activity  . Alcohol use: Yes    Comment: occ  . Drug use: No  . Sexual activity: Yes  Lifestyle  . Physical activity:    Days per week: 7 days    Minutes per session: 60 min  . Stress: Not at all  Relationships  . Social connections:    Talks on phone: More than three times a week    Gets together: More than three times a week    Attends religious service: More than 4 times per year    Active member of club or organization: No    Attends meetings of clubs or organizations: Never    Relationship status: Widowed  Other Topics Concern  . Not on file  Social History Narrative   Tobacco use, amount per day now: NEVER USED   Past tobacco use, amount per day: NEVER USED    How many years did you use tobacco: NONE   Alcohol use (drinks per week): 10 GLASSES OF WINE A WEEK   Diet:   Do you drink/eat things with caffeine: YES   Marital status:  WIDOWED                    What year were you married?   Do you live in a house, apartment, assisted living, condo, trailer, etc.? APARTMENT   Is it one or more stories? ONE STORY   How many persons live in your home? NONE   Do you have pets in your home?( please list) NO   Current or past profession: FINANCIAL   Do you exercise?  YES                                Type and how often? WALKING 1-2 HOURS   Do you have a living will? YES   Do you have a DNR form?    NO                               If not, do you want to discuss one?   Do you have signed POA/HPOA forms?   YES                     If so, please bring to you appointment    Outpatient Encounter Medications as of 12/04/2017  Medication Sig  . albuterol (PROVENTIL HFA;VENTOLIN HFA) 108 (90 Base) MCG/ACT inhaler Inhale 1 puff into the lungs every 4 (four) hours as needed for wheezing or shortness of breath.   . Coenzyme Q10 (CO Q 10) 100 MG CAPS Take 2 capsules by mouth daily.  . fluticasone (FLONASE) 50 MCG/ACT nasal spray Place 1 spray into both nostrils as needed for allergies or rhinitis.  . Fluticasone-Salmeterol (ADVAIR) 250-50 MCG/DOSE AEPB Inhale 1 puff into the lungs 2 (two) times daily.   Marland Kitchen guaiFENesin (MUCINEX) 600 MG 12 hr tablet Take 600 mg by mouth 3 (three) times a week.  . metFORMIN (GLUCOPHAGE-XR) 500 MG 24 hr tablet Take 500 mg by mouth daily with breakfast.  . NONFORMULARY OR COMPOUNDED ITEM 500 mg daily. Tumeric  . polyethylene glycol (MIRALAX / GLYCOLAX) packet Take 17 g by mouth daily.  . simvastatin (ZOCOR) 40 MG tablet TAKE 1 TABLET ONCE DAILY IN THE EVENING.  . tadalafil (CIALIS) 20 MG tablet Take 20 mg by mouth daily as needed for erectile  dysfunction.  . valACYclovir (VALTREX) 1000 MG tablet Take 1,000 mg by mouth as needed (fever  blister).   . Vitamin D, Ergocalciferol, (DRISDOL) 50000 units CAPS capsule Take 50,000 Units by mouth every 14 (fourteen) days.   No facility-administered encounter medications on file as of 12/04/2017.     Activities of Daily Living In your present state of health, do you have any difficulty performing the following activities: 12/04/2017 09/12/2017  Hearing? N N  Comment - -  Vision? N N  Difficulty concentrating or making decisions? N N  Walking or climbing stairs? N Y  Comment - SECONDARY TO SHORTNESS OF BREATH  Dressing or bathing? N Y  Doing errands, shopping? N Y  Conservation officer, nature and eating ? N -  Using the Toilet? N -  In the past six months, have you accidently leaked urine? Y -  Do you have problems with loss of bowel control? N -  Managing your Medications? N -  Managing your Finances? N -  Housekeeping or managing your Housekeeping? N -    Patient Care Team: [name], DO as PCP - General (Geriatric Medicine)   Assessment:   This is a routine wellness examination for [place].  Exercise Activities and Dietary recommendations Current Exercise Habits: Structured exercise class, Type of exercise: Other - see comments(balance class), Time (Minutes): 30, Frequency (Times/Week): 5, Weekly Exercise (Minutes/Week): 150, Intensity: Mild, Exercise limited by: None identified  Goals    None      Fall Risk Fall Risk  12/04/2017 09/19/2017 08/08/2017  Falls in the past year? No No No   Is the patient's home free of loose throw rugs in walkways, pet beds, electrical cords, etc?   yes      Grab bars in the bathroom? yes      Handrails on the stairs?   yes      Adequate lighting?   yes  Depression Screen PHQ 2/9 Scores 12/04/2017 09/19/2017 08/08/2017  PHQ - 2 Score 0 0 0    Cognitive Function MMSE - Mini Mental State Exam 12/04/2017  Orientation to time 4  Orientation to Place 5  Registration 3  Attention/ Calculation 5  Recall 2  Language- name 2 objects 2  Language-  repeat 1  Language- follow 3 step command 3  Language- read & follow direction 1  Write a sentence 1  Copy design 1  Total score 28        Immunization History  Administered Date(s) Administered  . Influenza-Unspecified 03/12/2014, 03/02/2016, 02/26/2017  . Pneumococcal Conjugate-13 08/04/2016  . Pneumococcal Polysaccharide-23 04/05/2010  . Td 05/06/2017  . Tdap 03/12/2014  . Zoster 07/19/2007  . Zoster Recombinat (Shingrix) 02/26/2017, 03/29/2017    Qualifies for Shingles Vaccine? Up to date, completed  Screening Tests Health Maintenance  Topic Date Due  . Hepatitis C Screening  1945-12-21  . URINE MICROALBUMIN  07/17/1955  . PNA vac Low Risk Adult (2 of 2 - PPSV23) 08/04/2017  . OPHTHALMOLOGY EXAM  10/27/2017  . FOOT EXAM  11/27/2017  . INFLUENZA VACCINE  12/27/2017  . HEMOGLOBIN A1C  05/30/2018  . COLONOSCOPY  05/29/2025  . TETANUS/TDAP  05/07/2027   Cancer Screenings: Lung: Low Dose CT Chest recommended if Age 102-80 years, 30 pack-year currently smoking OR have quit w/in 15years. Patient does not qualify. Colorectal: up to date  Additional Screenings:  Hepatitis C Screening:declined  PNA 23 due-patient requested to get it once he has gotten more information about his recurring pneumonia episodes  Patient will get going to [facility] for annual eye exams    Plan:  I have personally reviewed and addressed the Medicare Annual Wellness questionnaire and have noted the following in the patient's chart:  A. Medical and social history B. Use of alcohol, tobacco or illicit drugs  C. Current medications and supplements D. Functional ability and status E.  Nutritional status F.  Physical activity G. Advance directives H. List of other physicians I.  Hospitalizations, surgeries, and ER visits in previous 12 months J.  [place] to include hearing, vision, cognitive, depression L. Referrals and appointments - none  In addition, I have reviewed and discussed  with patient certain preventive protocols, quality metrics, and best practice recommendations. A written personalized care plan for preventive services as well as general preventive health recommendations were provided to patient.  See attached scanned questionnaire for additional information.   Signed,   Tyson Dense, RN Nurse Health Advisor  Patient concerns: None

## 2017-12-05 ENCOUNTER — Encounter: Payer: Self-pay | Admitting: Internal Medicine

## 2017-12-05 ENCOUNTER — Non-Acute Institutional Stay: Payer: Medicare Other | Admitting: Internal Medicine

## 2017-12-05 VITALS — BP 120/70 | HR 69 | Temp 98.6°F | Ht 71.0 in | Wt 202.0 lb

## 2017-12-05 DIAGNOSIS — N5234 Erectile dysfunction following simple prostatectomy: Secondary | ICD-10-CM

## 2017-12-05 DIAGNOSIS — G4730 Sleep apnea, unspecified: Secondary | ICD-10-CM | POA: Diagnosis not present

## 2017-12-05 DIAGNOSIS — R739 Hyperglycemia, unspecified: Secondary | ICD-10-CM

## 2017-12-05 DIAGNOSIS — E663 Overweight: Secondary | ICD-10-CM | POA: Diagnosis not present

## 2017-12-05 DIAGNOSIS — K5901 Slow transit constipation: Secondary | ICD-10-CM | POA: Diagnosis not present

## 2017-12-05 DIAGNOSIS — E782 Mixed hyperlipidemia: Secondary | ICD-10-CM

## 2017-12-05 NOTE — Progress Notes (Signed)
Provider:  Rexene Edison. Mariea Clonts, D.O., C.M.D. Location:  Occupational psychologist of Service:  Clinic (12)  Previous PCP: [name], DO Patient Care Team: [name], DO as PCP - General (Geriatric Medicine)  Extended Emergency Contact Information Primary Emergency Contact: Cecelia Byars Address: [address]          [address] [place], [place] [address] Robert Norman of [employer] Phone: [phone] Relation: Son  Code Status: DNR Goals of Care: Advanced Directive information Advanced Directives 12/05/2017  Does Patient Have a Medical Advance Directive? Yes  Type of Advance Directive [place]  Does patient want to make changes to medical advance directive? No - Patient declined  Copy of [place] in Chart? Yes   Chief Complaint  Patient presents with  . Medical Management of Chronic Issues    extended visit    HPI: Patient is a 72 y.o. male seen today for an annual physical exam.  He has recovered from his pneumonia.  He says they still have not found the culprit as to what's causing it.  He was exposed to asbestos, but negative.  PET scan lit up and pulmonologist said he had lung nodules and lymph nodes.  Biopsy was negative.  He has more blood tests.  Also has PFTs upcoming.    He does not have sob walking around wellspirng for 45 mins or on the treadmill for 30-40 mins despite having to be off inhalers for tests.  He used mucinex for 2-3 weeks after his pneumonia and now using it 3 times a week and postnasal drip improves with 1/2 of a cheap otc allergy pill every other night, he has less drainage and cough goes away.  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  Not coughing as much and breathing well.    Is in an excellent relationship with the lady that helped him through his pneumonia.  He reports needing the cialis often--he uses 1.5 tabs or 2.  Things are back in working order.      Uses  miralax to have bms.  Works well.  No longer stressed with work.    Due for eye exam.    Past Medical History:  Diagnosis Date  . Asthma   . Calcific Achilles tendonitis    bilateral  . Controlled type 2 diabetes mellitus without complication ([place])   . DDD (degenerative disc disease), cervical   . Gallstone 09/11/2011  . GERD (gastroesophageal reflux disease)   . Hyperlipidemia    mixed  . Insomnia   . Mild cognitive impairment   . Overweight (BMI 25.0-29.9)   . Prostate cancer ([place])   . Pulmonary nodule/lesion, solitary 06/2004   f/u in 2014, 2015 stable in [facility]  . Sleep apnea   . Slow transit constipation   . Vitamin D deficiency    Past Surgical History:  Procedure Laterality Date  . CERVICAL FUSION  1999 & 2004   implant metal plate  . CHOLECYSTECTOMY  2012  . EYE SURGERY  1997   laser vision correction  . palatouvulopasty  1995  . PROSTATE SURGERY  09/11/2011   prostatectomy for prostate cancer     reports that he has never smoked. He has never used smokeless tobacco. He reports that he drinks alcohol. He reports that he does not use drugs.  Functional Status Survey:    Family History  Problem Relation  Age of Onset  . Cancer Mother   . Heart disease Father   . Heart disease Brother   . Diabetes Brother     Health Maintenance  Topic Date Due  . Hepatitis C Screening  Feb 20, 1946  . URINE MICROALBUMIN  07/17/1955  . PNA vac Low Risk Adult (2 of 2 - PPSV23) 08/04/2017  . OPHTHALMOLOGY EXAM  10/27/2017  . FOOT EXAM  11/27/2017  . INFLUENZA VACCINE  12/27/2017  . HEMOGLOBIN A1C  05/30/2018  . COLONOSCOPY  05/29/2025  . TETANUS/TDAP  05/07/2027    Allergies  Allergen Reactions  . Tetracyclines & Related Other (See Comments)    hepatitis  . Niacin And Related Other (See Comments)    headache  . Wellbutrin [Bupropion] Other (See Comments)    dizziness    Outpatient Encounter Medications as of 12/05/2017  Medication Sig  . albuterol (PROVENTIL  HFA;VENTOLIN HFA) 108 (90 Base) MCG/ACT inhaler Inhale 1 puff into the lungs every 4 (four) hours as needed for wheezing or shortness of breath.   . Coenzyme Q10 (CO Q 10) 100 MG CAPS Take 2 capsules by mouth daily.  . fluticasone (FLONASE) 50 MCG/ACT nasal spray Place 1 spray into both nostrils as needed for allergies or rhinitis.  . Fluticasone-Salmeterol (ADVAIR) 250-50 MCG/DOSE AEPB Inhale 1 puff into the lungs 2 (two) times daily.   Marland Kitchen guaiFENesin (MUCINEX) 600 MG 12 hr tablet Take 600 mg by mouth 3 (three) times a week.  . metFORMIN (GLUCOPHAGE-XR) 500 MG 24 hr tablet Take 500 mg by mouth daily with breakfast.  . NONFORMULARY OR COMPOUNDED ITEM 500 mg daily. Tumeric  . polyethylene glycol (MIRALAX / GLYCOLAX) packet Take 17 g by mouth daily.  . simvastatin (ZOCOR) 40 MG tablet TAKE 1 TABLET ONCE DAILY IN THE EVENING.  . tadalafil (CIALIS) 20 MG tablet Take 20 mg by mouth daily as needed for erectile dysfunction.  . valACYclovir (VALTREX) 1000 MG tablet Take 1,000 mg by mouth as needed (fever blister).   . Vitamin D, Ergocalciferol, (DRISDOL) 50000 units CAPS capsule Take 50,000 Units by mouth every 14 (fourteen) days.   No facility-administered encounter medications on file as of 12/05/2017.     Review of Systems  Constitutional: Negative for chills, fever and malaise/fatigue.  HENT: Positive for hearing loss.        Hearing aids  Eyes: Negative for blurred vision.  Respiratory: Negative for cough, shortness of breath and wheezing.        Cough better  Cardiovascular: Negative for chest pain, palpitations and leg swelling.  Gastrointestinal: Negative for abdominal pain, blood in stool, constipation, diarrhea and melena.  Genitourinary: Negative for dysuria.       ED  Musculoskeletal: Negative for falls and joint pain.       Prior neck fusion  Skin: Negative for itching and rash.  Neurological: Negative for dizziness and loss of consciousness.  Endo/Heme/Allergies: Does not  bruise/bleed easily.  Psychiatric/Behavioral: Negative for depression and memory loss. The patient is not nervous/anxious and does not have insomnia.     Vitals:   12/05/17 1015  BP: 120/70  Pulse: 69  Temp: 98.6 F (37 C)  TempSrc: Oral  SpO2: 95%  Weight: 202 lb (91.6 kg)  Height: 5\' 11"  (1.803 m)   Body mass index is 28.17 kg/m. Physical Exam  Constitutional: He is oriented to person, place, and time. He appears well-developed and well-nourished. No distress.  HENT:  Head: Normocephalic and atraumatic.  Right Ear: External ear normal.  Left Ear: External ear normal.  Nose: Nose normal.  Mouth/Throat: Oropharynx is clear and moist. No oropharyngeal exudate.  Some increased cerumen in right ear  Eyes: Pupils are equal, round, and reactive to light. Conjunctivae and EOM are normal.  Neck: Normal range of motion. Neck supple. No JVD present. No tracheal deviation present. No thyromegaly present.  Cardiovascular: Normal rate, regular rhythm, normal heart sounds and intact distal pulses.  Pulmonary/Chest: Effort normal and breath sounds normal. No respiratory distress.  Abdominal: Soft. Bowel sounds are normal. He exhibits no distension. There is no tenderness.  Musculoskeletal: Normal range of motion.  Lymphadenopathy:    He has no cervical adenopathy.  Neurological: He is alert and oriented to person, place, and time.  Skin: Skin is warm and dry. Capillary refill takes less than 2 seconds.  Psychiatric: He has a normal mood and affect. His behavior is normal. Judgment and thought content normal.    Labs reviewed: Basic Metabolic Panel: Recent Labs    09/12/17 0457 09/13/17 0434 11/27/17 0700  NA 136 141 137  K 4.3 4.0 4.5  CL 103 109  --   CO2 23 24  --   GLUCOSE 129* 122*  --   BUN 19 16 13   CREATININE 1.14 0.93 1.1  CALCIUM 9.3 8.7*  --    Liver Function Tests: Recent Labs    11/27/17 0700  AST 17  ALT 16  ALKPHOS 61   No results for input(s): LIPASE,  AMYLASE in the last 8760 hours. No results for input(s): AMMONIA in the last 8760 hours. CBC: Recent Labs    09/12/17 0457 09/13/17 0434 10/08/17 1640 11/20/17 1247 11/27/17 0700  WBC 20.5* 6.0 5.1 5.9 5.5  NEUTROABS 18.2* 4.6 3.4  --   --   HGB 13.8 12.4* 14.7 14.8 15.1  HCT 41.4 38.2* 42.7 44.2 46  MCV 89.0 90.5 88.6 88.0  --   PLT 179 167 176.0 207 218   Cardiac Enzymes: No results for input(s): CKTOTAL, CKMB, CKMBINDEX, TROPONINI in the last 8760 hours. BNP: Invalid input(s): POCBNP Lab Results  Component Value Date   HGBA1C 5.9 11/27/2017   Lab Results  Component Value Date   TSH 2.40 11/07/2016    Assessment/Plan 1. Hyperglycemia -reports his sugar has never been in the diabetic range only prediabetic -he has gained weight, but hba1c remains prediabetic on metformin--advised to continue it  2. Sleep apnea, unspecified type -no changes recently  3. Mixed hyperlipidemia -lipids well controlled, but did trend in the wrong direction since move here -counseled on diet and exercise  4. Overweight (BMI 25.0-29.9) -has gained 4 lbs since may but actually weighs the same as in march -counseled on increased exercise and improved choices in the dining room  5. Slow transit constipation -cont current miralax which has been effective for him  6. Erectile dysfunction following simple prostatectomy -cont cialis 1.5 tabs daily prn sexual intercourse -reports just one is not working anymore  Labs/tests ordered:  F/u hba1c, bmp before next visit  Jacorie Ernsberger L. Mikka Kissner, D.O. [facility] [address], [place] [address] Cell Phone (Mon-Fri 8am-5pm):  [phone] On Call:  [phone] & follow prompts after 5pm & weekends Office Phone:  [phone] Office Fax:  [phone]  ]

## 2017-12-25 ENCOUNTER — Ambulatory Visit (INDEPENDENT_AMBULATORY_CARE_PROVIDER_SITE_OTHER): Payer: Medicare Other | Admitting: Pulmonary Disease

## 2017-12-25 ENCOUNTER — Telehealth: Payer: Self-pay | Admitting: *Deleted

## 2017-12-25 ENCOUNTER — Ambulatory Visit: Payer: Medicare Other | Admitting: Pulmonary Disease

## 2017-12-25 ENCOUNTER — Other Ambulatory Visit (INDEPENDENT_AMBULATORY_CARE_PROVIDER_SITE_OTHER): Payer: Medicare Other

## 2017-12-25 ENCOUNTER — Encounter: Payer: Self-pay | Admitting: Pulmonary Disease

## 2017-12-25 VITALS — BP 120/78 | HR 74 | Ht 71.0 in | Wt 205.0 lb

## 2017-12-25 DIAGNOSIS — R0602 Shortness of breath: Secondary | ICD-10-CM

## 2017-12-25 DIAGNOSIS — J4540 Moderate persistent asthma, uncomplicated: Secondary | ICD-10-CM

## 2017-12-25 DIAGNOSIS — Z77090 Contact with and (suspected) exposure to asbestos: Secondary | ICD-10-CM | POA: Insufficient documentation

## 2017-12-25 DIAGNOSIS — D869 Sarcoidosis, unspecified: Secondary | ICD-10-CM | POA: Diagnosis not present

## 2017-12-25 LAB — HEPATIC FUNCTION PANEL
ALK PHOS: 45 U/L (ref 39–117)
ALT: 18 U/L (ref 0–53)
AST: 17 U/L (ref 0–37)
Albumin: 4.4 g/dL (ref 3.5–5.2)
BILIRUBIN TOTAL: 0.7 mg/dL (ref 0.2–1.2)
Bilirubin, Direct: 0.1 mg/dL (ref 0.0–0.3)
Total Protein: 7.1 g/dL (ref 6.0–8.3)

## 2017-12-25 LAB — PULMONARY FUNCTION TEST
DL/VA % PRED: 95 %
DL/VA: 4.45 ml/min/mmHg/L
DLCO UNC: 31.15 ml/min/mmHg
DLCO unc % pred: 92 %
FEF 25-75 PRE: 3.43 L/s
FEF2575-%Pred-Pre: 140 %
FEV1-%Pred-Pre: 104 %
FEV1-PRE: 3.43 L
FEV1FVC-%Pred-Pre: 108 %
FEV6-%PRED-PRE: 101 %
FEV6-Pre: 4.31 L
FEV6FVC-%Pred-Pre: 105 %
FVC-%PRED-PRE: 96 %
FVC-Pre: 4.34 L
PRE FEV6/FVC RATIO: 99 %
Pre FEV1/FVC ratio: 79 %

## 2017-12-25 MED ORDER — FLUTICASONE FUROATE-VILANTEROL 100-25 MCG/INH IN AEPB: 1.0000 | INHALATION_SPRAY | Freq: Every day | RESPIRATORY_TRACT | 0 refills | Status: DC

## 2017-12-25 MED ORDER — PREDNISONE 10 MG PO TABS: 10.0000 mg | ORAL_TABLET | Freq: Every day | ORAL | 0 refills | Status: DC

## 2017-12-25 MED ORDER — METFORMIN HCL ER 500 MG PO TB24: 1000.0000 mg | ORAL_TABLET | Freq: Every day | ORAL | 3 refills | Status: DC

## 2017-12-25 NOTE — Progress Notes (Signed)
@Patient  ID: Robert Norman, male    DOB: 19-Mar-1946, 72 y.o.   MRN: [id number]  Chief Complaint  Patient presents with  . Asthma    follow up - sob     Referring provider: [name], DO  HPI: 72 year old patient with history of asthma, diabetes, prostate cancer, severe sleep apnea (managed on CPAP).  Patient was diagnosed with asthma around 2014 was previously followed in [facility] by Dr. [name].  Patient referred to our office on 10/08/2017 for evaluation.  Chief complaint on 10/08/2017 his dyspnea on exertion. Patient of Dr. [name]  Recent [name] Encounters:   10/08/2017-initial office visit-[name] He said 3 episodes of pneumonia for the past 2 years.  Recently admitted in mid March 2019 for pneumonia with chest x-ray showing retrocardiac opacity.  He was treated for community-acquired pneumonia with IV Rocephin and azithromycin.  Discharged on p.o. Augmentin.  Urine strep pneumo and Legionella was negative. History noted for severe OSA.  He is currently on CPAP with good compliance.  Pets: No pets, no exposure to birds, farm animals Occupation: He was in [employer] and reports exposure to asbestos.  He later worked in the Systems analyst Exposures: Exposure to asbestos.  No mold, dampness, hot tub, Jacuzzi Smoking history: Never smoker Travel history: Lived in [place] in [place].  He was posted in [place] while in the [employer]. Relevant family history: Significant family history of lung cancer in the parents, OSA.  Plan:  Symptoms are not very typical of asthma.  We will get records from his previous pulmonologist for review. Given his history of recurrent pneumonias we will evaluate for any inflammatory process of the lung.  He also need evaluation for lung fibrosis given exposure of asbestos. Check CBC differential, blood allergy profile, ANA, rheumatoid factor, CCP, quantitative immunoglobulin.  I will schedule him for high-resolution CT and  pulmonary function test. Advised him to continue Breo inhaler for now pending review of this test and records from his pulmonologist.  Will order lab work: CBC with differential, blood allergy panel, ANA, rheumatoid factor, CCP, quant hemoglobins, high-res CT, PFT, will obtain records from [name] pulmonologist, continue [facility]   11/09/2017-telephone encounter- PET scan discussed with patient showing uptake in multiple sites concerning for malignancy, please order ultrasound-guided biopsy by IR-[name] Ultrasound biopsy on neck and 6/25 >>>path negative, granulomas - ? 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, multiple sites of uptake 10/19/2017-CT high-res- no findings of interstitial lung disease, there are multiple pulmonary nodules scattered throughout lungs bilaterally findings of sepsis suspicious for potential metastatic disease    Cardiac:  12/25/17 - ekg -sinus rhythm, decreasing R wave progression on a on one leads  Labs:  10/08/2017-CBC with differential-normal 10/08/2017-IgG, IgA, IgM-normal 10/08/2017-IgE-2 10/08/2017-ANA, IFA, RA-all normal  10/08/17 - CCP normal  10/08/2017 - respiratory allergy panel-normal 11/29/17 - ACE - 70   Micro:  11/20/2017-surgical path- lymph node needle core biopsy right cervical-no malignancy identified   Chart Review:     12/25/17 OV  72 year old patient seen today for pulmonary function test and follow-up from lymph node biopsy.  12/25/2017-pulmonary function test- FVC 4.34 (96 predicted), FEF ratio 79, FEV1 3.43 (104 predicted), DLCO 92, patient did not want to complete postbronchodilator testing.  Nitrogen washout was performed due to patient being unable to perform Pleth  Patient reports he stopped taking his Breo Ellipta due to cost.  Patient reports is over $350 a month  for patient to pick up this inhaler.  Patient also has stopped taking his allergy medication.  Patient  reports his breathing has been fine.  Patient reports he walked today for 1.5 hours.  Patient reports he did have some occasional shortness of breath going up hills on that walk.  But was able to complete it without any complications.  Of note ACE level that was completed since last appointment was elevated at 70.    Allergies  Allergen Reactions  . Tetracyclines & Related Other (See Comments)    hepatitis  . Niacin And Related Other (See Comments)    headache  . Wellbutrin [Bupropion] Other (See Comments)    dizziness    Immunization History  Administered Date(s) Administered  . Influenza-Unspecified 03/12/2014, 03/02/2016, 02/26/2017  . Pneumococcal Conjugate-13 08/04/2016  . Pneumococcal Polysaccharide-23 04/05/2010  . Td 05/06/2017  . Tdap 03/12/2014  . Zoster 07/19/2007  . Zoster Recombinat (Shingrix) 02/26/2017, 03/29/2017    Past Medical History:  Diagnosis Date  . Asthma   . Calcific Achilles tendonitis    bilateral  . Controlled type 2 diabetes mellitus without complication ([place])   . DDD (degenerative disc disease), cervical   . Gallstone 09/11/2011  . GERD (gastroesophageal reflux disease)   . Hyperlipidemia    mixed  . Insomnia   . Mild cognitive impairment   . Overweight (BMI 25.0-29.9)   . Prostate cancer ([place])   . Pulmonary nodule/lesion, solitary 06/2004   f/u in 2014, 2015 stable in [facility]  . Sleep apnea   . Slow transit constipation   . Vitamin D deficiency     Tobacco History: Social History   Tobacco Use  Smoking Status Never Smoker  Smokeless Tobacco Never Used   Counseling given: Yes Continue not smoking.  Outpatient Encounter Medications as of 12/25/2017  Medication Sig  . Coenzyme Q10 (CO Q 10) 100 MG CAPS Take 2 capsules by mouth daily.  . polyethylene glycol (MIRALAX / GLYCOLAX) packet Take 17 g by mouth daily.  . tadalafil (CIALIS) 20 MG tablet Take 30 mg by mouth daily as needed for erectile dysfunction.  . Vitamin D,  Ergocalciferol, (DRISDOL) 50000 units CAPS capsule Take 50,000 Units by mouth every 14 (fourteen) days.  . [DISCONTINUED] NONFORMULARY OR COMPOUNDED ITEM 500 mg daily. Tumeric  . [DISCONTINUED] simvastatin (ZOCOR) 40 MG tablet TAKE 1 TABLET ONCE DAILY IN THE EVENING.  . fluticasone furoate-vilanterol (BREO ELLIPTA) 100-25 MCG/INH AEPB Inhale 1 puff into the lungs daily.  . predniSONE (DELTASONE) 10 MG tablet Take 1 tablet (10 mg total) by mouth daily with breakfast.  . [DISCONTINUED] albuterol (PROVENTIL HFA;VENTOLIN HFA) 108 (90 Base) MCG/ACT inhaler Inhale 1 puff into the lungs every 4 (four) hours as needed for wheezing or shortness of breath.   . [DISCONTINUED] Fluticasone-Salmeterol (ADVAIR) 250-50 MCG/DOSE AEPB Inhale 1 puff into the lungs 2 (two) times daily.   . [DISCONTINUED] guaiFENesin (MUCINEX) 600 MG 12 hr tablet Take 600 mg by mouth 3 (three) times a week.  . [DISCONTINUED] metFORMIN (GLUCOPHAGE-XR) 500 MG 24 hr tablet Take 500 mg by mouth daily with breakfast.  . [DISCONTINUED] metFORMIN (GLUCOPHAGE-XR) 500 MG 24 hr tablet Take 2 tablets (1,000 mg total) by mouth daily with breakfast.  . [DISCONTINUED] valACYclovir (VALTREX) 1000 MG tablet Take 1,000 mg by mouth as needed (fever blister).    No facility-administered encounter medications on file as of 12/25/2017.      Review of Systems  Review of Systems  Constitutional: Negative for activity change, chills,  fatigue, fever and unexpected weight change.  HENT: Negative for postnasal drip, rhinorrhea, sinus pressure, sinus pain, sneezing and sore throat.   Respiratory: Positive for cough and shortness of breath (occasional). Negative for wheezing.   Cardiovascular: Negative for chest pain and palpitations.  Gastrointestinal: Negative for constipation, diarrhea, nausea and vomiting.  Genitourinary: Negative for hematuria and urgency.  Musculoskeletal: Negative for arthralgias.  Skin: Negative for color change.  Neurological:  Negative for dizziness, seizures and headaches.  Psychiatric/Behavioral: Negative for dysphoric mood. The patient is not nervous/anxious.   All other systems reviewed and are negative.    Physical Exam  BP 120/78 (BP Location: Left Arm, Cuff Size: Normal)   Pulse 74   Ht 5\' 11"  (1.803 m)   Wt 205 lb (93 kg)   SpO2 96%   BMI 28.59 kg/m   Wt Readings from Last 5 Encounters:  12/25/17 205 lb (93 kg)  12/05/17 202 lb (91.6 kg)  12/04/17 204 lb (92.5 kg)  11/20/17 198 lb (89.8 kg)  10/08/17 198 lb 12.8 oz (90.2 kg)     Physical Exam  Constitutional: He is oriented to person, place, and time and well-developed, well-nourished, and in no distress. No distress.  HENT:  Head: Normocephalic and atraumatic.  Right Ear: Hearing, tympanic membrane, external ear and ear canal normal.  Left Ear: Hearing, tympanic membrane, external ear and ear canal normal.  Mouth/Throat: Uvula is midline and oropharynx is clear and moist. No oropharyngeal exudate.  + Postnasal drip  Eyes: Pupils are equal, round, and reactive to light.  Neck: Normal range of motion. Neck supple. No JVD present.  Cardiovascular: Normal rate, regular rhythm and normal heart sounds.  Pulmonary/Chest: Effort normal and breath sounds normal. No accessory muscle usage. No respiratory distress. He has no decreased breath sounds. He has no wheezes. He has no rhonchi.  Musculoskeletal: Normal range of motion. He exhibits no edema.  Lymphadenopathy:    He has no cervical adenopathy.  Neurological: He is alert and oriented to person, place, and time. Gait normal.  Skin: Skin is warm and dry. He is not diaphoretic. No erythema.  Psychiatric: Mood, memory, affect and judgment normal.  Nursing note and vitals reviewed.     Lab Results:  CBC    Component Value Date/Time   WBC 5.5 11/27/2017 0700   WBC 5.9 11/20/2017 1247   RBC 5.02 11/20/2017 1247   HGB 15.1 11/27/2017 0700   HCT 46 11/27/2017 0700   PLT 218 11/27/2017  0700   MCV 88.0 11/20/2017 1247   MCH 29.5 11/20/2017 1247   MCHC 33.5 11/20/2017 1247   RDW 12.4 11/20/2017 1247   LYMPHSABS 1.0 10/08/2017 1640   MONOABS 0.6 10/08/2017 1640   EOSABS 0.1 10/08/2017 1640   BASOSABS 0.0 10/08/2017 1640    BMET    Component Value Date/Time   NA 137 11/27/2017 0700   K 4.5 11/27/2017 0700   CL 109 09/13/2017 0434   CO2 24 09/13/2017 0434   GLUCOSE 122 (H) 09/13/2017 0434   BUN 13 11/27/2017 0700   CREATININE 1.1 11/27/2017 0700   CREATININE 0.93 09/13/2017 0434   CALCIUM 8.7 (L) 09/13/2017 0434   GFRNONAA >60 09/13/2017 0434   GFRAA >60 09/13/2017 0434    BNP No results found for: BNP  ProBNP No results found for: PROBNP  Imaging: No results found.   Assessment & Plan:   72 year old patient seen for follow-up today.  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  PFTs reassuring as there is no  restriction, no obstruction as well.  Unfortunate unable to assess bronchodilator response.  DLCO is reassuring at 53.  We will have patient treated with 20 mg slow taper of prednisone.  Take with food in the morning.  Patient to take 20 mg daily for the next 2 weeks, then 10 mg for the following 2 weeks then stop.  Patient have 25-month follow-up with our office  We will have patient continue Breo Ellipta inhaler.  1 month samples provided today.  EKG today showing sinus rhythm with some decreasing R wave progression.  Especially in lead II.  Will monitor for now.  Will look to see if we have old records of previous EKGs.  No other EKGs in our system.  No other echocardiogram in our system.   Patient to follow-up with ophthalmologist over the next month for eye eval. We will also have LFTs drawn today as well as urine calcium.  Follow-up in 2 months   Sarcoidosis We will start you on Prednisone 10 mg tablet >>> Take 20 mg daily for the next 2 weeks, take this with food in the morning >>>The following 2 weeks take 10 mg daily, with food in the  morning  Continue Breo Ellipta 100 >>> 1 puff daily >>>Due in the morning right when you are waking up >>>Rinse out your mouth after use  >>>sample provided today   Follow-up with ophthalmology for eye exam  Lab work today >>> Liver function test as well as urine calcium  EKG today  Follow-up in 2 months   Asthma  Continue Breo Ellipta 100 >>> 1 puff daily >>>Due in the morning right when you are waking up >>>Rinse out your mouth after use  >>>sample provided today        Lauraine Rinne, NP 12/25/2017

## 2017-12-25 NOTE — Assessment & Plan Note (Signed)
We will start you on Prednisone 10 mg tablet >>> Take 20 mg daily for the next 2 weeks, take this with food in the morning >>>The following 2 weeks take 10 mg daily, with food in the morning  Continue Breo Ellipta 100 >>> 1 puff daily >>>Due in the morning right when you are waking up >>>Rinse out your mouth after use  >>>sample provided today   Follow-up with ophthalmology for eye exam  Lab work today >>> Liver function test as well as urine calcium  EKG today  Follow-up in 2 months

## 2017-12-25 NOTE — Patient Instructions (Addendum)
We will start you on Prednisone 10 mg tablet >>> Take 20 mg daily for the next 2 weeks, take this with food in the morning >>>The following 2 weeks take 10 mg daily, with food in the morning  Continue Breo Ellipta 100 >>> 1 puff daily >>>Due in the morning right when you are waking up >>>Rinse out your mouth after use  >>>sample provided today   Follow-up with ophthalmology for eye exam  Lab work today >>> Liver function test as well as urine calcium  EKG today  Follow-up in 2 months     Please contact the office if your symptoms worsen or you have concerns that you are not improving.   Thank you for choosing [facility] for your healthcare, and for allowing [place] to partner with you on your healthcare journey. I am thankful to be able to provide care to you today.   Wyn Quaker FNP-C    Sarcoidosis Sarcoidosis is a disease that causes inflammation in your organs and other areas of your body. The lungs are most often affected (pulmonary sarcoidosis). Sarcoidosis can also affect your lymph nodes, liver, eyes, skin, or any other body tissue. When you have sarcoidosis, small clumps of tissue (granulomas) form in the affected area of your body. Granulomas are made up of your body's defense (immune) cells. Inflammation results when your body reacts to a harmful substance. Normally, inflammation goes away after immune cells get rid of the harmful substance. In sarcoidosis, the immune cells form granulomas instead. What are the causes? The exact cause of sarcoidosis is not known. Something triggers the immune system to respond, such as dust, chemicals, bacteria, or a virus. What increases the risk? You may be at a greater risk for sarcoidosis if you:  Have a family history of the disease.  Are African American.  Are of Northern European ancestry.  Are 54-35 years old.  Are male.  What are the signs or symptoms? Many people with sarcoidosis have no symptoms. Others  have very mild symptoms. Sarcoidosis most often affects the lungs. Symptoms include:  Chest pain.  Coughing.  Wheezing.  Shortness of breath.  Other common symptoms include:  Night sweats.  Weight loss.  Fatigue.  Depression.  A sense of uneasiness.  How is this diagnosed? Sarcoidosis may be diagnosed by:  Medical history and physical exam.  Chest X-ray. This looks for granulomas in your lungs.  Lung function tests. These measure your breathing and look for problems related to sarcoidosis.  Examining a sample of tissue under a microscope (biopsy).  How is this treated? Sarcoidosis usually clears up without treatment. You may take medicines to reduce inflammation or relieve symptoms. These may include:  Prednisone. This steroid reduces inflammation related to sarcoidosis.  Chloroquine or hydroxychloroquine. These are antimalarial medicines used to treat sarcoidosis that affects the skin or brain.  Methotrexate, leflunomide, or azathioprine. These medicines affect the immune system and can help with sarcoidosis in the joints, eyes, skin, or lungs.  Inhalers. Inhaled medicines can help you breathe if sarcoidosis is affecting your lungs.  Follow these instructions at home:  Do not use any tobacco products, including cigarettes, chewing tobacco, or electronic cigarettes. If you need help quitting, ask your health care provider.  Avoid secondhand smoke.  Avoid irritating dust and chemicals. Stay indoors on days when air quality is poor in your area.  Take medicines only as directed by your health care provider. Contact a health care provider if:  You have vision problems.  You have shortness of breath.  You have a dry, persistent cough.  You have an irregular heartbeat.  You have pain or ache in your joints, hands, or feet.  You have an unexplained rash. Get help right away if: You have chest pain. This information is not intended to replace advice given  to you by your health care provider. Make sure you discuss any questions you have with your health care provider. Document Released: 03/15/2004 Document Revised: 10/21/2015 Document Reviewed: 09/10/2013 Elsevier Interactive Patient Education  [employer].

## 2017-12-25 NOTE — Telephone Encounter (Signed)
Patient notified and agreed.  

## 2017-12-25 NOTE — Telephone Encounter (Signed)
Patient called and stated that he just had a Physical with Dr. Mariea Clonts and she suggested patient to continue Metformin. Patient stated that he would like to take her advise but would like to Increase the Metformin to Two tablets a day instead of One. Need Rx sent to [facility]. Please Advise.

## 2017-12-25 NOTE — Telephone Encounter (Signed)
Rx sent to pharmacy.  I guess he used up his current prescription.  Due to glucophage XR not being available in 1000mg  (different variations only), I've ordered for him to take 2 of the 500mg  tablets rather than just one.

## 2017-12-25 NOTE — Assessment & Plan Note (Signed)
  Continue Breo Ellipta 100 >>> 1 puff daily >>>Due in the morning right when you are waking up >>>Rinse out your mouth after use  >>>sample provided today

## 2017-12-25 NOTE — Assessment & Plan Note (Signed)
Stable unless high-res CT

## 2017-12-25 NOTE — Progress Notes (Signed)
PFT completed today.  

## 2017-12-26 ENCOUNTER — Telehealth: Payer: Self-pay | Admitting: Pulmonary Disease

## 2017-12-26 DIAGNOSIS — R0602 Shortness of breath: Secondary | ICD-10-CM

## 2017-12-26 DIAGNOSIS — D869 Sarcoidosis, unspecified: Secondary | ICD-10-CM | POA: Diagnosis not present

## 2017-12-26 NOTE — Telephone Encounter (Signed)
12/26/17 0913  Reach Robert Norman via telephone today.  Based off of EKG results from yesterday Dr. [name] and I both agree would be reasonable to proceed forward with an echocardiogram.  I have discussed this with the patient he agrees.  I have placed the order for the patient to have the echocardiogram completed.  Encouraged patient to keep follow-up with our office.  We will follow-up with him after the results of echocardiogram.  Robert Quaker, FNP

## 2017-12-27 LAB — CALCIUM, URINE, RANDOM: CALCIUM, RANDOM URINE: 6.9 mg/dL

## 2017-12-27 NOTE — Progress Notes (Signed)
See telephone note.  Discussed EKG results with patient as well as with Dr. [name].  We will proceed forward with echocardiogram.  The order has been placed.  Patient agrees to complete.  Wyn Quaker FNP

## 2017-12-28 ENCOUNTER — Ambulatory Visit (HOSPITAL_COMMUNITY): Payer: Medicare Other | Attending: Cardiovascular Disease

## 2017-12-28 ENCOUNTER — Other Ambulatory Visit: Payer: Self-pay

## 2017-12-28 DIAGNOSIS — E119 Type 2 diabetes mellitus without complications: Secondary | ICD-10-CM | POA: Insufficient documentation

## 2017-12-28 DIAGNOSIS — E785 Hyperlipidemia, unspecified: Secondary | ICD-10-CM | POA: Diagnosis not present

## 2017-12-28 DIAGNOSIS — R9431 Abnormal electrocardiogram [ECG] [EKG]: Secondary | ICD-10-CM | POA: Insufficient documentation

## 2017-12-28 DIAGNOSIS — J45909 Unspecified asthma, uncomplicated: Secondary | ICD-10-CM | POA: Diagnosis not present

## 2017-12-28 DIAGNOSIS — R0602 Shortness of breath: Secondary | ICD-10-CM | POA: Insufficient documentation

## 2017-12-28 DIAGNOSIS — G4730 Sleep apnea, unspecified: Secondary | ICD-10-CM | POA: Diagnosis not present

## 2018-01-04 NOTE — Progress Notes (Signed)
Echocardiogram results have come back showing normal systolic function.  We can further discuss at her next office visit.  This is good news.  Continue with plan of care as discussed.  Wyn Quaker, FNP

## 2018-01-07 ENCOUNTER — Telehealth: Payer: Self-pay | Admitting: Pulmonary Disease

## 2018-01-07 NOTE — Telephone Encounter (Signed)
Notes recorded by Lauraine Rinne, NP on 01/04/2018 at 3:22 PM EDT Echocardiogram results have come back showing normal systolic function. We can further discuss at her next office visit. This is good news. Continue with plan of care as discussed.  Spoke with patient, he is aware of results. Nothing else needed at time of call.

## 2018-02-19 DIAGNOSIS — G4733 Obstructive sleep apnea (adult) (pediatric): Secondary | ICD-10-CM | POA: Diagnosis not present

## 2018-02-22 ENCOUNTER — Other Ambulatory Visit: Payer: Self-pay | Admitting: *Deleted

## 2018-02-22 ENCOUNTER — Other Ambulatory Visit: Payer: Self-pay | Admitting: Internal Medicine

## 2018-02-22 MED ORDER — TADALAFIL 20 MG PO TABS: 30.0000 mg | ORAL_TABLET | Freq: Every day | ORAL | 1 refills | Status: DC | PRN

## 2018-02-22 NOTE — Telephone Encounter (Signed)
Robert Norman [place] Last OV Reviewed.

## 2018-02-25 NOTE — Progress Notes (Signed)
@Patient  ID: Robert Norman, male    DOB: 11-10-1945, 72 y.o.   MRN: [id number]  Chief Complaint  Patient presents with  . Follow-up    Sarcoid follow up     Referring provider: [name], DO  HPI:  72 year old male patient followed in our office for dyspnea, severe obstructive sleep apnea.  Patient initially referred to our office on 10/08/2017.  Patient had previously been managed by pulmonologist in [facility] Dr. Corwin Levins.  PMH: Diabetes, prostate cancer Smoker/ Smoking History: Never smoker Maintenance:  Breo 100 Pt of: Dr. [name]  Recent [facility] Encounters:   10/08/2017-initial office visit-[name] He said 3 episodes of pneumonia for the past 2 years.  Recently admitted in mid March 2019 for pneumonia with chest x-ray showing retrocardiac opacity.  He was treated for community-acquired pneumonia with IV Rocephin and azithromycin.  Discharged on p.o. Augmentin.  Urine strep pneumo and Legionella was negative. History noted for severe OSA.  He is currently on CPAP with good compliance. Plan:  Symptoms are not very typical of asthma.  We will get records from his previous pulmonologist for review. Given his history of recurrent pneumonias we will evaluate for any inflammatory process of the lung.  He also need evaluation for lung fibrosis given exposure of asbestos. Check CBC differential, blood allergy profile, ANA, rheumatoid factor, CCP, quantitative immunoglobulin.  I will schedule him for high-resolution CT and pulmonary function test. Advised him to continue Breo inhaler for now pending review of this test and records from his pulmonologist.  Will order lab work: CBC with differential, blood allergy panel, ANA, rheumatoid factor, CCP, quant hemoglobins, high-res CT, PFT, will obtain records from [facility] pulmonologist, continue [facility]   11/09/2017-telephone encounter- PET scan discussed with patient showing uptake in multiple sites concerning for  malignancy, please order ultrasound-guided biopsy by IR-[name] Ultrasound biopsy on neck and 6/25 >>>path negative, granulomas - ? sarcoid   12/25/17 - OV - BM  72 year old patient seen today for pulmonary function test and follow-up from lymph node biopsy. 12/25/2017-pulmonary function test- FVC 4.34 (96 predicted), FEF ratio 79, FEV1 3.43 (104 predicted), DLCO 92, patient did not want to complete postbronchodilator testing.  Nitrogen washout was performed due to patient being unable to perform Pleth.  Patient reporting that he stopped his Breo Ellipta due to cost.  Patient reporting to over $300 a month to pick up this inhaler.  Patient denies any increased dyspnea.  Patient reports he walked today for 1/2 hours.  Of note ACE level that was completed since last office point was elevated at 70. 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  Unable to assess bronchodilator response due to unable to complete full PFTs.  DLCO is 92.  We will have patient take short course of prednisone.  Follow-up in 2 months.  We will have patient continue Brio Ellipta inhaler.  EKG today shows sinus rhythm with decreasing R wave progression.  Will get echocardiogram.  Patient follow-up with ophthalmology.  Will have LFTs drawn as well as urine calcium.  Follow-up in 2 months  02/26/2018  - Visit   72 year old male patient following up today with a 47-month follow-up for sarcoidosis.  Patient was treated briefly with a prednisone taper.  Patient reports he is been doing well.  No issues or complaints.  Patient would like to stop his Breo Ellipta inhaler he does not feel like he receives benefit from the inhaler.  Patient denies any current  acute symptoms except for an occasional cough with clear to Walko mucus.  Patient also reports he has had some allergy symptoms.  Is started taking half tablet of Benadryl (12.5 mg) at night to help with symptoms.  Patient reports adherence to  CPAP.  Patient reports having no complications with the use.  Patient reports that he left his CPAP and he can sleep without it.  Patient has persistent complaints of concern regarding potential pneumonias in the future.  Patient reports that he previously was treated for pneumonia in spring/2019.  This required hospitalization.  Patient reported that the symptoms presented very quickly over a matter of hours.  Patient then had to spend over 8 hours in the emergency room.  Patient would like to avoid emergency room's in the future if possible.  Patient is requesting a antibiotic for when he travels if he starts having acute symptoms.  And what should he do in the situations.  Patient would like to discuss today.   Tests:   12/25/2017-pulmonary function test- FVC 4.34 (96 predicted), FEF ratio 79, FEV1 3.43 (104 predicted), DLCO 92, patient did not want to complete postbronchodilator testing.  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, multiple sites of uptake 10/19/2017-CT high-res- no findings of interstitial lung disease, there are multiple pulmonary nodules scattered throughout lungs bilaterally findings of sepsis suspicious for potential metastatic disease   Cardiac:  12/25/17 - ekg -sinus rhythm, decreasing R wave progression on a on one leads 12/28/2017-echocardiogram-LV ejection fraction 55 to 60%  Labs:  10/08/2017-CBC with differential-normal 10/08/2017-IgG, IgA, IgM-normal 10/08/2017-IgE-2 10/08/2017-ANA, IFA, RA-all normal  10/08/17 - CCP normal  10/08/2017 - respiratory allergy panel-normal 11/29/17 - ACE - 70  12/26/2017-urine calcium-6.9 12/25/2017-hepatic function panel-normal  Micro:  11/20/2017-surgical path- lymph node needle core biopsy right cervical-no malignancy identified    Chart Review:     Specialty Problems      Pulmonary Problems   Asthma   Sleep  apnea   Allergic rhinitis      Allergies  Allergen Reactions  . Tetracyclines & Related Other (See Comments)    hepatitis  . Niacin And Related Other (See Comments)    headache  . Wellbutrin [Bupropion] Other (See Comments)    dizziness    Immunization History  Administered Date(s) Administered  . Influenza-Unspecified 03/12/2014, 03/02/2016, 02/26/2017  . Pneumococcal Conjugate-13 08/04/2016  . Pneumococcal Polysaccharide-23 04/05/2010  . Td 05/06/2017  . Tdap 03/12/2014  . Zoster 07/19/2007  . Zoster Recombinat (Shingrix) 02/26/2017, 03/29/2017   >>> Patient reports to receive his flu vaccine from primary care  Past Medical History:  Diagnosis Date  . Asthma   . Calcific Achilles tendonitis    bilateral  . Controlled type 2 diabetes mellitus without complication ([place])   . DDD (degenerative disc disease), cervical   . Gallstone 09/11/2011  . GERD (gastroesophageal reflux disease)   . Hyperlipidemia    mixed  . Insomnia   . Mild cognitive impairment   . Overweight (BMI 25.0-29.9)   . Prostate cancer ([place])   . Pulmonary nodule/lesion, solitary 06/2004   f/u in 2014, 2015 stable in [facility]  . Sleep apnea   . Slow transit constipation   . Vitamin D deficiency     Tobacco History: Social History   Tobacco Use  Smoking Status Never Smoker  Smokeless Tobacco Never Used   Counseling given: Yes  Continue not smoking  Outpatient  Encounter Medications as of 02/26/2018  Medication Sig  . Coenzyme Q10 (CO Q 10) 100 MG CAPS Take 2 capsules by mouth daily.  . fluticasone furoate-vilanterol (BREO ELLIPTA) 100-25 MCG/INH AEPB Inhale 1 puff into the lungs daily.  . polyethylene glycol (MIRALAX / GLYCOLAX) packet Take 17 g by mouth daily.  . simvastatin (ZOCOR) 40 MG tablet TAKE 1 TABLET ONCE DAILY IN THE EVENING.  . tadalafil (ADCIRCA/CIALIS) 20 MG tablet Take 1.5 tablets (30 mg total) by mouth daily as needed for erectile dysfunction.  . Vitamin D, Ergocalciferol,  (DRISDOL) 50000 units CAPS capsule Take 50,000 Units by mouth every 14 (fourteen) days.  Marland Kitchen azithromycin (ZITHROMAX) 250 MG tablet 500mg  (two tablets) today, then 250mg  (1 tablet) for the next 4 days  . [DISCONTINUED] predniSONE (DELTASONE) 10 MG tablet Take 1 tablet (10 mg total) by mouth daily with breakfast. (Patient not taking: Reported on 02/26/2018)   No facility-administered encounter medications on file as of 02/26/2018.      Review of Systems  Review of Systems  Constitutional: Positive for fatigue. Negative for activity change, chills, fever and unexpected weight change.  HENT: Positive for congestion and postnasal drip. Negative for rhinorrhea, sinus pressure, sinus pain, sneezing and sore throat.   Eyes: Negative.   Respiratory: Positive for cough (productive cough with Santana and thick mucous ) and shortness of breath. Negative for wheezing.   Cardiovascular: Negative for chest pain and palpitations.  Gastrointestinal: Negative for constipation, diarrhea, nausea and vomiting.  Endocrine: Negative.   Musculoskeletal: Negative.   Skin: Negative.   Neurological: Negative for dizziness and headaches.  Psychiatric/Behavioral: Negative.  Negative for dysphoric mood. The patient is not nervous/anxious.   All other systems reviewed and are negative.    Physical Exam  BP 112/74 (BP Location: Left Arm, Cuff Size: Normal)   Pulse 67   Ht 5\' 10"  (1.778 m)   Wt 204 lb 6.4 oz (92.7 kg)   SpO2 94%   BMI 29.33 kg/m   Wt Readings from Last 5 Encounters:  02/26/18 204 lb 6.4 oz (92.7 kg)  12/25/17 205 lb (93 kg)  12/05/17 202 lb (91.6 kg)  12/04/17 204 lb (92.5 kg)  11/20/17 198 lb (89.8 kg)     Physical Exam  Constitutional: He is oriented to person, place, and time and well-developed, well-nourished, and in no distress. No distress.  HENT:  Head: Normocephalic and atraumatic.  Right Ear: Hearing, tympanic membrane and external ear normal.  Left Ear: Hearing, tympanic  membrane, external ear and ear canal normal.  Nose: Mucosal edema present. Right sinus exhibits no maxillary sinus tenderness and no frontal sinus tenderness. Left sinus exhibits no maxillary sinus tenderness and no frontal sinus tenderness.  Mouth/Throat: Uvula is midline and oropharynx is clear and moist. No oropharyngeal exudate.  +right canal 50 percent occluded with cerumen   Eyes: Pupils are equal, round, and reactive to light.  Neck: Normal range of motion. Neck supple. No JVD present.  Cardiovascular: Normal rate, regular rhythm and normal heart sounds.  Pulmonary/Chest: Effort normal and breath sounds normal. No accessory muscle usage. No respiratory distress. He has no decreased breath sounds. He has no wheezes. He has no rhonchi.  Abdominal: Soft. Bowel sounds are normal. There is no tenderness.  Musculoskeletal: Normal range of motion. He exhibits no edema.  Lymphadenopathy:    He has no cervical adenopathy.  Neurological: He is alert and oriented to person, place, and time. Gait normal.  Skin: Skin is warm and dry. He  is not diaphoretic. No erythema.  Psychiatric: Mood, memory, affect and judgment normal.  Nursing note and vitals reviewed.    Lab Results:  CBC    Component Value Date/Time   WBC 5.5 11/27/2017   WBC 5.9 11/20/2017 1247   RBC 5.02 11/20/2017 1247   HGB 15.1 11/27/2017   HCT 46 11/27/2017   PLT 218 11/27/2017   MCV 88.0 11/20/2017 1247   MCH 29.5 11/20/2017 1247   MCHC 33.5 11/20/2017 1247   RDW 12.4 11/20/2017 1247   LYMPHSABS 1.0 10/08/2017 1640   MONOABS 0.6 10/08/2017 1640   EOSABS 0.1 10/08/2017 1640   BASOSABS 0.0 10/08/2017 1640    BMET    Component Value Date/Time   NA 137 11/27/2017   K 4.5 11/27/2017   CL 109 09/13/2017 0434   CO2 24 09/13/2017 0434   GLUCOSE 122 (H) 09/13/2017 0434   BUN 13 11/27/2017   CREATININE 1.1 11/27/2017   CREATININE 0.93 09/13/2017 0434   CALCIUM 8.7 (L) 09/13/2017 0434   GFRNONAA >60 09/13/2017 0434    GFRAA >60 09/13/2017 0434    BNP No results found for: BNP  ProBNP No results found for: PROBNP  Imaging: No results found.    Assessment & Plan:   Pleasant 72 year old patient seen for follow-up visit today.  I am okay with the patient trialing going off of Breo Ellipta 100.  Patient to contact our office if any respiratory symptoms occur as he is off of his Breo.  I will provide the patient with a prescription to azithromycin for if he starts having pneumonia like symptoms from when he travels.  Patient with probable diagnosis of sarcoidosis.  If patient has to start taking antibiotic patient is to contact our office so that he can be scheduled for a follow-up visit.  Patient agrees.  Patient to follow-up with our office in 6 months and sooner if needed for acute symptoms.  Sarcoidosis Okay to hold Breo Ellipta 100 >>> Contact our office if you are feeling any increased shortness of breath or changes after stopping this inhaler  Follow-up in 6 months  Antibiotic for travel in the future:  Azithromycin 250mg  tablet  >>>Take 2 tablets (500mg  total) today, and then 1 tablet (250mg ) for the next four days  >>>take with food  >>>can also take probiotic and / or yogurt while on antibiotic  >>> Contact our office if you have to take this medication  Obtain flu vaccine with primary care    Asthma Okay to hold Breo Ellipta 100 >>> Contact our office if you are feeling any increased shortness of breath or changes after stopping this inhaler  Follow-up in 6 months    Sleep apnea  We recommend that you continue using your CPAP daily >>>Keep up the hard work using your device >>> Goal should be wearing this for the entire night that you are sleeping, at least 4 to 6 hours  Remember:  . Do not drive or operate heavy machinery if tired or drowsy.  . Please notify the supply company and office if you are unable to use your device regularly due to missing supplies or machine  being broken.  . Work on maintaining a healthy weight and following your recommended nutrition plan  . Maintain proper daily exercise and movement  . Maintaining proper use of your device can also help improve management of other chronic illnesses such as: Blood pressure, blood sugars, and weight management.   BiPAP/ CPAP Cleaning:  >>>Clean weekly,  with Dawn soap, and bottle brush.  Set up to air dry.    Allergic rhinitis Start daily Zyrtec 10 mg  >>> Can obtain generic from [facility] >>>Take daily  Can Use Benadryl as needed for allergy symptoms      Lauraine Rinne, NP 02/26/2018

## 2018-02-26 ENCOUNTER — Ambulatory Visit (INDEPENDENT_AMBULATORY_CARE_PROVIDER_SITE_OTHER): Payer: Medicare Other | Admitting: Pulmonary Disease

## 2018-02-26 ENCOUNTER — Encounter: Payer: Self-pay | Admitting: Pulmonary Disease

## 2018-02-26 VITALS — BP 112/74 | HR 67 | Ht 70.0 in | Wt 204.4 lb

## 2018-02-26 DIAGNOSIS — J45909 Unspecified asthma, uncomplicated: Secondary | ICD-10-CM | POA: Diagnosis not present

## 2018-02-26 DIAGNOSIS — J309 Allergic rhinitis, unspecified: Secondary | ICD-10-CM | POA: Diagnosis not present

## 2018-02-26 DIAGNOSIS — D869 Sarcoidosis, unspecified: Secondary | ICD-10-CM

## 2018-02-26 DIAGNOSIS — G4730 Sleep apnea, unspecified: Secondary | ICD-10-CM | POA: Diagnosis not present

## 2018-02-26 MED ORDER — AZITHROMYCIN 250 MG PO TABS: ORAL_TABLET | ORAL | 0 refills | Status: DC

## 2018-02-26 NOTE — Patient Instructions (Addendum)
Okay to hold Breo Ellipta 100 >>> Contact our office if you are feeling any increased shortness of breath or changes after stopping this inhaler  Follow-up in 6 months  Antibiotic for travel in the future:  Azithromycin 250mg  tablet  >>>Take 2 tablets (500mg  total) today, and then 1 tablet (250mg ) for the next four days  >>>take with food  >>>can also take probiotic and / or yogurt while on antibiotic  >>> Contact our office if you have to take this medication  Obtain flu vaccine with primary care  Can use Debrox over-the-counter eardrops for earwax in right ear canal >>>If Debrox eardrops do not work, follow-up with primary care for a formal ear flushing   Start daily Zyrtec 10 mg  >>> Can obtain generic from [facility] >>>Take daily  Can Use Benadryl as needed for allergy symptoms  We recommend that you continue using your CPAP daily >>>Keep up the hard work using your device >>> Goal should be wearing this for the entire night that you are sleeping, at least 4 to 6 hours  Remember:  . Do not drive or operate heavy machinery if tired or drowsy.  . Please notify the supply company and office if you are unable to use your device regularly due to missing supplies or machine being broken.  . Work on maintaining a healthy weight and following your recommended nutrition plan  . Maintain proper daily exercise and movement  . Maintaining proper use of your device can also help improve management of other chronic illnesses such as: Blood pressure, blood sugars, and weight management.   BiPAP/ CPAP Cleaning:  >>>Clean weekly, with Dawn soap, and bottle brush.  Set up to air dry.    It is flu season:   >>>Remember to be washing your hands regularly, using hand sanitizer, be careful to use around herself with has contact with people who are sick will increase her chances of getting sick yourself. >>> Best ways to protect herself from the flu: Receive the yearly flu vaccine, practice good  hand hygiene washing with soap and also using hand sanitizer when available, eat a nutritious meals, get adequate rest, hydrate appropriately    As of 04/01/2018 we will be moving! We will no longer be at our [place] location.   Our new address and phone number will be:  [address] [place],  [address] Telephone number: [phone]   Please contact the office if your symptoms worsen or you have concerns that you are not improving.   Thank you for choosing [facility] for your healthcare, and for allowing [place] to partner with you on your healthcare journey. I am thankful to be able to provide care to you today.   Wyn Quaker FNP-C

## 2018-02-26 NOTE — Assessment & Plan Note (Signed)
Start daily Zyrtec 10 mg  >>> Can obtain generic from [facility] >>>Take daily  Can Use Benadryl as needed for allergy symptoms

## 2018-02-26 NOTE — Assessment & Plan Note (Signed)
Okay to hold Breo Ellipta 100 >>> Contact our office if you are feeling any increased shortness of breath or changes after stopping this inhaler  Follow-up in 6 months  Antibiotic for travel in the future:  Azithromycin 250mg  tablet  >>>Take 2 tablets (500mg  total) today, and then 1 tablet (250mg ) for the next four days  >>>take with food  >>>can also take probiotic and / or yogurt while on antibiotic  >>> Contact our office if you have to take this medication  Obtain flu vaccine with primary care

## 2018-02-26 NOTE — Assessment & Plan Note (Signed)

## 2018-02-26 NOTE — Assessment & Plan Note (Signed)
Okay to hold Breo Ellipta 100 >>> Contact our office if you are feeling any increased shortness of breath or changes after stopping this inhaler  Follow-up in 6 months

## 2018-03-04 DIAGNOSIS — Z23 Encounter for immunization: Secondary | ICD-10-CM | POA: Diagnosis not present

## 2018-03-06 ENCOUNTER — Other Ambulatory Visit: Payer: Self-pay | Admitting: Internal Medicine

## 2018-03-06 DIAGNOSIS — H6123 Impacted cerumen, bilateral: Secondary | ICD-10-CM

## 2018-03-06 NOTE — Progress Notes (Signed)
Pt was in [facility] with clinic nurse getting ears flushed due to reverberation when wearing hearing aids.  She was unable to get the cerumen to come out despite the warm water and peroxide and his having used debrox drops for several days.  She asked me to help remove it with a curette.  I was able to get the majority of the cerumen out of his right ear, but the process was incredibly uncomfortable for him and he asked that we not even try to remove the left ear cerumen that remained obstructive.  We discussed an ENT referral b/c he said he had the wax suctioned from his ears at an ENT before which was helpful.

## 2018-04-09 DIAGNOSIS — H903 Sensorineural hearing loss, bilateral: Secondary | ICD-10-CM | POA: Diagnosis not present

## 2018-04-09 DIAGNOSIS — H838X3 Other specified diseases of inner ear, bilateral: Secondary | ICD-10-CM | POA: Diagnosis not present

## 2018-04-09 DIAGNOSIS — H6123 Impacted cerumen, bilateral: Secondary | ICD-10-CM | POA: Diagnosis not present

## 2018-04-15 ENCOUNTER — Other Ambulatory Visit: Payer: Self-pay | Admitting: Internal Medicine

## 2018-04-16 ENCOUNTER — Other Ambulatory Visit: Payer: Self-pay | Admitting: Internal Medicine

## 2018-05-14 ENCOUNTER — Other Ambulatory Visit: Payer: Self-pay | Admitting: Internal Medicine

## 2018-06-06 DIAGNOSIS — D649 Anemia, unspecified: Secondary | ICD-10-CM | POA: Diagnosis not present

## 2018-06-06 DIAGNOSIS — E782 Mixed hyperlipidemia: Secondary | ICD-10-CM | POA: Diagnosis not present

## 2018-06-06 DIAGNOSIS — E119 Type 2 diabetes mellitus without complications: Secondary | ICD-10-CM | POA: Diagnosis not present

## 2018-06-06 DIAGNOSIS — R739 Hyperglycemia, unspecified: Secondary | ICD-10-CM | POA: Diagnosis not present

## 2018-06-06 LAB — BASIC METABOLIC PANEL
BUN: 14 (ref 4–21)
Creatinine: 0.9 (ref 0.6–1.3)
Glucose: 156
Potassium: 4.8 (ref 3.4–5.3)
Sodium: 140 (ref 137–147)

## 2018-06-06 LAB — HEMOGLOBIN A1C: Hemoglobin A1C: 6.4

## 2018-06-10 ENCOUNTER — Encounter: Payer: Self-pay | Admitting: Internal Medicine

## 2018-06-12 ENCOUNTER — Encounter: Payer: Self-pay | Admitting: Internal Medicine

## 2018-06-12 ENCOUNTER — Non-Acute Institutional Stay: Payer: Medicare Other | Admitting: Internal Medicine

## 2018-06-12 VITALS — BP 132/72 | HR 68 | Temp 98.0°F | Ht 70.0 in | Wt 206.0 lb

## 2018-06-12 DIAGNOSIS — Z6829 Body mass index (BMI) 29.0-29.9, adult: Secondary | ICD-10-CM

## 2018-06-12 DIAGNOSIS — E118 Type 2 diabetes mellitus with unspecified complications: Secondary | ICD-10-CM | POA: Insufficient documentation

## 2018-06-12 DIAGNOSIS — E663 Overweight: Secondary | ICD-10-CM | POA: Diagnosis not present

## 2018-06-12 DIAGNOSIS — E119 Type 2 diabetes mellitus without complications: Secondary | ICD-10-CM

## 2018-06-12 DIAGNOSIS — E782 Mixed hyperlipidemia: Secondary | ICD-10-CM | POA: Diagnosis not present

## 2018-06-12 DIAGNOSIS — H6123 Impacted cerumen, bilateral: Secondary | ICD-10-CM

## 2018-06-12 MED ORDER — METFORMIN HCL ER 500 MG PO TB24: 500.0000 mg | ORAL_TABLET | Freq: Two times a day (BID) | ORAL | 0 refills | Status: DC

## 2018-06-12 NOTE — Progress Notes (Signed)
Location:  Occupational psychologist of Service:  Clinic (12)  Provider: Yisrael Obryan L. Mariea Clonts, D.O., C.M.D.  Goals of Care:  Advanced Directives 12/05/2017  Does Patient Have a Medical Advance Directive? Yes  Type of Advance Directive [place]  Does patient want to make changes to medical advance directive? No - Patient declined  Copy of [place] in Chart? Yes     Chief Complaint  Patient presents with  . Medical Management of Chronic Issues    26mth follow-up    HPI: Patient is a 73 y.o. male seen today for medical management of chronic diseases.    He's gained 15 lbs since moving here.  His sugar goes up when his weight goes up. He is still walking, but he's eating too many calories.  He's also developed a fondness for wine.  His girlfriend drinks wine.  He will have 2 glasses with lunch, 2 with dinner and one in the afternoon on the weekends.  Here, he tries not to eat dessert nightly.  He loves their soup, but it is salty.     Discussed 2 glasses of wine per day limit.    He struggles with the chocolate food group.   Counseled about leafy greens/veggies being half of the plate, only 1/4 of plate starchy food and 1/4 meat.  He lost 35 lbs one time when eating healthily and dealing with stress of his late wife's colon cancer.   Says he does better to eat in the dining room.    He got his cerumen removed at ENT with a vacuum suction.    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  He's breathing well.  He's glad not to have to use expensive inhalers.    He was meant to get his eye exam in November--needs to schedule.      Past Medical History:  Diagnosis Date  . Asthma   . Calcific Achilles tendonitis    bilateral  . Controlled type 2 diabetes mellitus without complication ([place])   . DDD (degenerative disc disease), cervical   . Gallstone 09/11/2011  . GERD (gastroesophageal reflux disease)   . Hyperlipidemia    mixed  .  Insomnia   . Mild cognitive impairment   . Overweight (BMI 25.0-29.9)   . Prostate cancer ([place])   . Pulmonary nodule/lesion, solitary 06/2004   f/u in 2014, 2015 stable in [facility]  . Sleep apnea   . Slow transit constipation   . Vitamin D deficiency     Past Surgical History:  Procedure Laterality Date  . CERVICAL FUSION  1999 & 2004   implant metal plate  . CHOLECYSTECTOMY  2012  . EYE SURGERY  1997   laser vision correction  . palatouvulopasty  1995  . PROSTATE SURGERY  09/11/2011   prostatectomy for prostate cancer     Allergies  Allergen Reactions  . Tetracyclines & Related Other (See Comments)    hepatitis  . Niacin And Related Other (See Comments)    headache  . Wellbutrin [Bupropion] Other (See Comments)    dizziness    Outpatient Encounter Medications as of 06/12/2018  Medication Sig  . Coenzyme Q10 (CO Q 10) 100 MG CAPS Take 2 capsules by mouth daily.  . metFORMIN (GLUCOPHAGE-XR) 500 MG 24 hr tablet TAKE 2 TABLETS DAILY WITH BREAKFAST.  Marland Kitchen polyethylene glycol (MIRALAX / GLYCOLAX) packet Take 17 g by mouth daily.  . simvastatin (ZOCOR) 40 MG tablet TAKE 1 TABLET ONCE  DAILY IN THE EVENING.  . tadalafil (ADCIRCA/CIALIS) 20 MG tablet TAKE 1.5 TABLET BY MOUTH DAILY AS NEEDED FOR ERECTILE DYSFUNCTION  . Vitamin D, Ergocalciferol, (DRISDOL) 50000 units CAPS capsule Take 50,000 Units by mouth every 14 (fourteen) days.  . [DISCONTINUED] azithromycin (ZITHROMAX) 250 MG tablet 500mg  (two tablets) today, then 250mg  (1 tablet) for the next 4 days  . [DISCONTINUED] fluticasone furoate-vilanterol (BREO ELLIPTA) 100-25 MCG/INH AEPB Inhale 1 puff into the lungs daily.   No facility-administered encounter medications on file as of 06/12/2018.     Review of Systems:  Review of Systems  Constitutional: Negative for chills, fever and malaise/fatigue.       Wt gain  HENT: Positive for hearing loss.        Recurrent difficulty with cerumen impaction  Eyes: Negative for blurred vision.   Respiratory: Negative for cough, sputum production, shortness of breath and wheezing.   Cardiovascular: Negative for chest pain, palpitations and leg swelling.  Gastrointestinal: Negative for abdominal pain, blood in stool, constipation, diarrhea, heartburn and melena.  Genitourinary: Negative for dysuria.  Musculoskeletal: Negative for falls and joint pain.  Skin: Negative for itching and rash.  Neurological: Negative for dizziness and loss of consciousness.  Endo/Heme/Allergies: Does not bruise/bleed easily.  Psychiatric/Behavioral: Negative for depression and memory loss. The patient is not nervous/anxious and does not have insomnia.     Health Maintenance  Topic Date Due  . Hepatitis C Screening  [birth year]  . PNA vac Low Risk Adult (2 of 2 - PPSV23) 08/04/2017  . OPHTHALMOLOGY EXAM  10/27/2017  . FOOT EXAM  11/27/2017  . URINE MICROALBUMIN  11/28/2018  . HEMOGLOBIN A1C  12/05/2018  . COLONOSCOPY  05/29/2025  . TETANUS/TDAP  05/07/2027  . INFLUENZA VACCINE  Completed    Physical Exam: Vitals:   06/12/18 0852  BP: 132/72  Pulse: 68  Temp: 98 F (36.7 C)  TempSrc: Oral  SpO2: 95%  Weight: 206 lb (93.4 kg)  Height: 5\' 10"  (1.778 m)   Body mass index is 29.56 kg/m. Physical Exam Constitutional:      Appearance: Normal appearance.  HENT:     Head: Normocephalic and atraumatic.  Cardiovascular:     Rate and Rhythm: Normal rate and regular rhythm.     Pulses: Normal pulses.     Heart sounds: Normal heart sounds.  Pulmonary:     Effort: Pulmonary effort is normal.     Breath sounds: Normal breath sounds. No wheezing, rhonchi or rales.  Abdominal:     General: Bowel sounds are normal.  Musculoskeletal: Normal range of motion.  Skin:    General: Skin is warm and dry.  Neurological:     General: No focal deficit present.     Mental Status: He is alert and oriented to person, place, and time.  Psychiatric:        Mood and Affect: Mood normal.     Labs  reviewed: Basic Metabolic Panel: Recent Labs    09/12/17 0457 09/13/17 0434 11/27/17 06/06/18 0715  NA 136 141 137 140  K 4.3 4.0 4.5 4.8  CL 103 109  --   --   CO2 23 24  --   --   GLUCOSE 129* 122*  --   --   BUN 19 16 13 14   CREATININE 1.14 0.93 1.1 0.9  CALCIUM 9.3 8.7*  --   --    Liver Function Tests: Recent Labs    11/27/17 0700 12/25/17 1640  AST 17 17  ALT 16 18  ALKPHOS 61 45  BILITOT  --  0.7  PROT  --  7.1  ALBUMIN  --  4.4   No results for input(s): LIPASE, AMYLASE in the last 8760 hours. No results for input(s): AMMONIA in the last 8760 hours. CBC: Recent Labs    09/12/17 0457 09/13/17 0434 10/08/17 1640 11/20/17 1247 11/27/17  WBC 20.5* 6.0 5.1 5.9 5.5  NEUTROABS 18.2* 4.6 3.4  --   --   HGB 13.8 12.4* 14.7 14.8 15.1  HCT 41.4 38.2* 42.7 44.2 46  MCV 89.0 90.5 88.6 88.0  --   PLT 179 167 176.0 207 218   Lipid Panel: Recent Labs    11/27/17  CHOL 133  HDL 47  LDLCALC 58  TRIG 139   Lab Results  Component Value Date   HGBA1C 6.4 06/06/2018    Assessment/Plan 1. Controlled type 2 diabetes mellitus without complication, without long-term current use of insulin (HCC) -change metformin to take one with breakfast and one with evening meal due to larger intake later in the day and increasing hba1c -also decrease portion sizes of starchy carbs and desserts to be 1-2 per week instead of daily; decrease wine intake to 2 glasses max per day (having 5 on the weekends!!)  2. Mixed hyperlipidemia -work on some dietary changes, continue walking  3. Overweight (BMI 25.0-29.9) -up 15 lbs since he moved in and knows he's eating too much  4. BMI 29.0-29.9,adult -bmi up from last time also, counseled on eating habits and increased alcohol intake being culprits, no longer on any steroids  5. 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 Quantavis Obryant, D.O. [facility] [address], [place] [address] Cell Phone (Mon-Fri 8am-5pm):  [phone] On Call:  [phone] & follow prompts after 5pm & weekends Office Phone:  [phone] Office Fax:  [phone]

## 2018-06-12 NOTE — Patient Instructions (Addendum)
Please cut down on portions of starchy foods and sweets. Reduced your wine intake to no more than 2 glasses per day.   Please schedule your eye exam. Continue walking.

## 2018-07-07 ENCOUNTER — Other Ambulatory Visit: Payer: Self-pay | Admitting: Internal Medicine

## 2018-07-08 ENCOUNTER — Other Ambulatory Visit: Payer: Self-pay | Admitting: *Deleted

## 2018-07-08 MED ORDER — GLUCOSE BLOOD VI STRP: ORAL_STRIP | 3 refills | Status: DC

## 2018-07-08 NOTE — Telephone Encounter (Signed)
Patient requested refill of Freestyle Lite Test Strips. Faxed.

## 2018-07-09 LAB — HM DIABETES EYE EXAM

## 2018-07-10 ENCOUNTER — Other Ambulatory Visit: Payer: Self-pay | Admitting: Internal Medicine

## 2018-07-10 ENCOUNTER — Encounter: Payer: Self-pay | Admitting: *Deleted

## 2018-07-29 ENCOUNTER — Other Ambulatory Visit: Payer: Self-pay | Admitting: *Deleted

## 2018-07-29 MED ORDER — FREESTYLE LANCETS MISC: 3 refills | Status: DC

## 2018-07-29 NOTE — Telephone Encounter (Signed)
[facility]

## 2018-07-30 ENCOUNTER — Other Ambulatory Visit: Payer: Self-pay | Admitting: Internal Medicine

## 2018-08-01 ENCOUNTER — Telehealth: Payer: Self-pay | Admitting: Pulmonary Disease

## 2018-08-01 MED ORDER — PREDNISONE 10 MG PO TABS: ORAL_TABLET | ORAL | 0 refills | Status: DC

## 2018-08-01 NOTE — Telephone Encounter (Signed)
Pt is in lobby (11:22 PM) . He has a letter stating that he has all the symptoms of the COVID-19 (but he does not have the virus). He is going on a cruise next week and would like some kind of letter/memo stating that he has all the symptoms that mimic the virus but does not want to cause panic and confusion along with complications.   Pt is stating that he is having issues with his cell phone and is asking that someone come talk to him now if at all possible.

## 2018-08-01 NOTE — Telephone Encounter (Signed)
Patient's letter is not stating he has symptoms of COVID-19, pt is concerned that his diagnosis of chronic cough related to post nasal drip will cause issue and panic when he goes on his cruise, he would like perhaps a letter of clarification of his diagnosis to reduce fever when traveling, in addition he would like to know if his diagnosis of chronic cough, and history of pneumonia make is a higher risk to contract the COVID-19 virus. Patient wanting to wait for this because he has been having issues with his cell phone, so it may be hard to reach him -pr

## 2018-08-01 NOTE — Telephone Encounter (Signed)
08/01/2018 1212  I reviewed the patient's letter that he is drafted.  The patient is managed in our office for sarcoidosis.  He also does have allergic rhinitis.  The past medical history of sarcoidosis does technically put him at a increased risk of coated 19 as this is a chronic lung disease.  Patient is also at increased risk based off of his age.  With that said, the patient was at a stable interval when he was last evaluated in this office.  The Breo Ellipta inhaler was actually stopped as patient wanted to be trialed off of this.  If he has resumed [place] am okay with that.  He does have a prescription of azithromycin to be used if he starts developing bronchitis-like symptoms or increased fatigue when he travels.  He did have a significant pneumonia in the past which is why he would like to have antibiotics with him on hand.  Okay to also place an order for a prednisone taper as well if he has worsening shortness of breath for him to have for travel.  Place order:   Prednisone 10mg  tablet  >>>4 tabs for 2 days, then 3 tabs for 2 days, 2 tabs for 2 days, then 1 tab for 2 days, then stop >>>take with food  >>>take in the morning   Please place order.   I would recommend that you print this triage note to attach to his letter as well as you can print his last office note.  Please remind patient to keep follow-up with our office.  Please also emphasized the patient the importance of hand hygiene and making sure that he is around people also using adequate hand hygiene as this is the best way to limit exposures to flu, viral illnesses, and covid19.  As always if patient does have to start taking his prednisone or his azithromycin he needs to contact our office and let us know so we can evaluate him as quickly as possible.  This was always our understanding with him having antibiotics and prednisone at home for his use.  Wyn Quaker, FNP

## 2018-08-01 NOTE — Telephone Encounter (Signed)
Patient lobby printed out Brian's note and LOV. Handed to patient. Patient verified [facility] is where the Rx needed to be sent. Order placed patient will call when he returns after his cruise to make an appointment for follow up.  Nothing further needed at this time.

## 2018-08-01 NOTE — Telephone Encounter (Signed)
Message routed to Wyn Quaker, NP

## 2018-08-05 NOTE — Telephone Encounter (Signed)
Attempted to contact patient by phone but no answer.  Left detailed voicemail on home phone as per dpr [phone]) regarding Robert Quaker, FNP note about cruise ship travel.  Due to coronavirus the CDC is not recommending cruise ship travel, especially for those people with underlying health issues. Information from Brian's note shared in voicemail message. Message included our return office number if further questions.

## 2018-08-05 NOTE — Telephone Encounter (Signed)
08/05/2018 1317  Please contact the patient and let them know the CDC has released a statement regarding COVID19 and cruise line travel:   CDC recommends travelers, particularly those with underlying health issues, defer all cruise ship travel at this time.  We support this recommendation. Pt should avoid cruise ship travel. Please recommend that the patient check the website:   [employer]  For more information and support.   Wyn Quaker FNP

## 2018-08-13 ENCOUNTER — Other Ambulatory Visit: Payer: Self-pay | Admitting: *Deleted

## 2018-08-13 DIAGNOSIS — E119 Type 2 diabetes mellitus without complications: Secondary | ICD-10-CM

## 2018-08-13 MED ORDER — FREESTYLE LANCETS MISC: 3 refills | Status: DC

## 2018-08-13 MED ORDER — GLUCOSE BLOOD VI STRP: ORAL_STRIP | 3 refills | Status: DC

## 2018-10-01 ENCOUNTER — Encounter: Payer: Self-pay | Admitting: Internal Medicine

## 2018-10-01 DIAGNOSIS — E782 Mixed hyperlipidemia: Secondary | ICD-10-CM | POA: Diagnosis not present

## 2018-10-01 DIAGNOSIS — D649 Anemia, unspecified: Secondary | ICD-10-CM | POA: Diagnosis not present

## 2018-10-01 DIAGNOSIS — E785 Hyperlipidemia, unspecified: Secondary | ICD-10-CM | POA: Diagnosis not present

## 2018-10-01 DIAGNOSIS — E119 Type 2 diabetes mellitus without complications: Secondary | ICD-10-CM | POA: Diagnosis not present

## 2018-10-01 LAB — HEMOGLOBIN A1C: Hemoglobin A1C: 6.8

## 2018-10-01 LAB — BASIC METABOLIC PANEL
BUN: 16 (ref 4–21)
Creatinine: 1 (ref 0.6–1.3)
Glucose: 201
Potassium: 4.6 (ref 3.4–5.3)
Sodium: 136 — AB (ref 137–147)

## 2018-10-01 LAB — LIPID PANEL
Cholesterol: 124 (ref 0–200)
HDL: 43 (ref 35–70)
LDL Cholesterol: 59
Triglycerides: 114 (ref 40–160)

## 2018-10-07 ENCOUNTER — Encounter: Payer: Self-pay | Admitting: Internal Medicine

## 2018-10-08 DIAGNOSIS — H6123 Impacted cerumen, bilateral: Secondary | ICD-10-CM | POA: Diagnosis not present

## 2018-10-09 ENCOUNTER — Other Ambulatory Visit: Payer: Self-pay

## 2018-10-09 ENCOUNTER — Non-Acute Institutional Stay: Payer: Medicare Other | Admitting: Internal Medicine

## 2018-10-09 ENCOUNTER — Encounter: Payer: Self-pay | Admitting: Internal Medicine

## 2018-10-09 VITALS — BP 128/80 | HR 61 | Temp 98.0°F | Ht 70.0 in | Wt 215.0 lb

## 2018-10-09 DIAGNOSIS — E782 Mixed hyperlipidemia: Secondary | ICD-10-CM

## 2018-10-09 DIAGNOSIS — E119 Type 2 diabetes mellitus without complications: Secondary | ICD-10-CM | POA: Diagnosis not present

## 2018-10-09 DIAGNOSIS — Z7289 Other problems related to lifestyle: Secondary | ICD-10-CM

## 2018-10-09 DIAGNOSIS — Z789 Other specified health status: Secondary | ICD-10-CM

## 2018-10-09 DIAGNOSIS — E669 Obesity, unspecified: Secondary | ICD-10-CM | POA: Insufficient documentation

## 2018-10-09 DIAGNOSIS — L821 Other seborrheic keratosis: Secondary | ICD-10-CM

## 2018-10-09 DIAGNOSIS — D869 Sarcoidosis, unspecified: Secondary | ICD-10-CM | POA: Diagnosis not present

## 2018-10-09 DIAGNOSIS — Z6830 Body mass index (BMI) 30.0-30.9, adult: Secondary | ICD-10-CM

## 2018-10-09 NOTE — Patient Instructions (Signed)
Please bring me a copy of the letter from your prior GI doctor so we can determine if a repeat colonoscopy is actually needed.

## 2018-10-09 NOTE — Progress Notes (Signed)
Location:  Occupational psychologist of Service:  Clinic (12)  Provider: Zephaniah Enyeart L. Mariea Clonts, D.O., C.M.D.  Goals of Care:  Advanced Directives 10/09/2018  Does Patient Have a Medical Advance Directive? Yes  Type of Advance Directive [place]  Does patient want to make changes to medical advance directive? No - Patient declined  Copy of [place] in Chart? Yes - validated most recent copy scanned in chart (See row information)  Would patient like information on creating a medical advance directive? No - Patient declined     Chief Complaint  Patient presents with  . Medical Management of Chronic Issues    16mth follow-up    HPI: Patient is a 73 y.o. male seen today for medical management of chronic diseases.    He got new machine and test strips--readings went from being too low to normal to elevated above what he'd like.  He can't really choose his foods now due to covid meals.    He is walking regularly 1-2 hrs per day--pant size is down.  It takes different times b/c he stops to talk to people.  He's gradually been gaining even before the past two mos of social isolation.  Reports appetite.  He is still drinking the wine which helps his depression.  That is one of the enjoyable things he has along with dessert.  He admits to no self-control.     He used to be on a high fiber diet and kept losing weight when he cooked for his late wife who had colon cancer and got constipated a lot.  He also was grieving.    Discussed that BMI is now 30.    He had his ear wax removed yesterday.    His breathing is fine.  The last pneumonia episode was so bad.  Turned out he had sarcoid after he had lymph nodes and nodules.  He had a prednisone taper starting in March.  He does have 3-4 other family members with sarcoid.  He does still have some cough. He is following with pulmonary.  Has keratoses on his back that he'd like to see derm about.   He had been seeing urology in [place] for his prior prostate ca.  He was only going to him once a year after a while b/c they felt he'd die of something else.  He was due for PSA.  He got a letter that he's due for colonoscopy.   Asked pt to bring me a copy of the letter so we can understand why he needed one 2-3 yrs later.  He even had a cologuard done with his PCP.    Past Medical History:  Diagnosis Date  . Asthma   . Calcific Achilles tendonitis    bilateral  . Controlled type 2 diabetes mellitus without complication ([place])   . DDD (degenerative disc disease), cervical   . Gallstone 09/11/2011  . GERD (gastroesophageal reflux disease)   . Hyperlipidemia    mixed  . Insomnia   . Mild cognitive impairment   . Overweight (BMI 25.0-29.9)   . Prostate cancer ([place])   . Pulmonary nodule/lesion, solitary 06/2004   f/u in 2014, 2015 stable in [facility]  . Sleep apnea   . Slow transit constipation   . Vitamin D deficiency     Past Surgical History:  Procedure Laterality Date  . CERVICAL FUSION  1999 & 2004   implant metal plate  . CHOLECYSTECTOMY  2012  .  EYE SURGERY  1997   laser vision correction  . palatouvulopasty  1995  . PROSTATE SURGERY  09/11/2011   prostatectomy for prostate cancer     Allergies  Allergen Reactions  . Tetracyclines & Related Other (See Comments)    hepatitis  . Niacin And Related Other (See Comments)    headache  . Wellbutrin [Bupropion] Other (See Comments)    dizziness    Outpatient Encounter Medications as of 10/09/2018  Medication Sig  . Coenzyme Q10 (CO Q 10) 100 MG CAPS Take 2 capsules by mouth daily.  Marland Kitchen glucose blood (FREESTYLE LITE) test strip Use to test blood sugar Daily. Dx: E11.9  . Lancets (FREESTYLE) lancets Use to test blood sugar daily. Dx E11.9  . metFORMIN (GLUCOPHAGE-XR) 500 MG 24 hr tablet one with breakfast and one with evening meal  . polyethylene glycol (MIRALAX / GLYCOLAX) packet Take 17 g by mouth daily.  . predniSONE  (DELTASONE) 10 MG tablet 4tabs x2d, 3tabs x2d, 2tabs x2d, 1tab x2d, then stop  . simvastatin (ZOCOR) 40 MG tablet TAKE 1 TABLET ONCE DAILY IN THE EVENING.  . tadalafil (ADCIRCA/CIALIS) 20 MG tablet TAKE 1 AND 1/2 TABLET BY MOUTH DAILY AS NEEDED FOR ERECTILE DYSFUNCTION  . Vitamin D, Ergocalciferol, (DRISDOL) 1.25 MG (50000 UT) CAPS capsule TAKE 1 CAPSULE ONCE A WEEK.   No facility-administered encounter medications on file as of 10/09/2018.     Review of Systems:  Review of Systems  Constitutional: Negative for chills, fever and malaise/fatigue.  HENT: Positive for hearing loss.        Gets recurrent cerumen impaction (just addressed)  Eyes: Negative for blurred vision.  Respiratory: Positive for cough.   Cardiovascular: Negative for chest pain, palpitations and leg swelling.  Gastrointestinal: Negative for abdominal pain, blood in stool, constipation, diarrhea and melena.  Genitourinary: Negative for dysuria.  Musculoskeletal: Negative for falls, joint pain and myalgias.  Skin: Negative for itching and rash.  Neurological: Negative for dizziness and loss of consciousness.  Endo/Heme/Allergies: Does not bruise/bleed easily.  Psychiatric/Behavioral: Negative for depression and memory loss. The patient is not nervous/anxious and does not have insomnia.     Health Maintenance  Topic Date Due  . Hepatitis C Screening  07-Nov-1945  . PNA vac Low Risk Adult (2 of 2 - PPSV23) 08/04/2017  . FOOT EXAM  11/27/2017  . URINE MICROALBUMIN  11/28/2018  . INFLUENZA VACCINE  12/28/2018  . HEMOGLOBIN A1C  04/03/2019  . OPHTHALMOLOGY EXAM  07/10/2019  . COLONOSCOPY  05/29/2025  . TETANUS/TDAP  05/07/2027    Physical Exam: Vitals:   10/09/18 0833  BP: 128/80  Pulse: 61  Temp: 98 F (36.7 C)  TempSrc: Oral  SpO2: 95%  Weight: 215 lb (97.5 kg)  Height: 5\' 10"  (1.778 m)   Body mass index is 30.85 kg/m. Physical Exam Constitutional:      Appearance: Normal appearance.  HENT:     Head:  Normocephalic and atraumatic.  Pulmonary:     Effort: Pulmonary effort is normal.  Neurological:     General: No focal deficit present.     Mental Status: He is alert and oriented to person, place, and time.  Psychiatric:        Mood and Affect: Mood normal.        Behavior: Behavior normal.        Thought Content: Thought content normal.        Judgment: Judgment normal.     Labs reviewed: Basic Metabolic Panel: Recent  Labs    11/27/17 06/06/18 10/01/18 0400  NA 137 140 136*  K 4.5 4.8 4.6  BUN 13 14 16   CREATININE 1.1 0.9 1.0   Liver Function Tests: Recent Labs    11/27/17 0700 12/25/17 1640  AST 17 17  ALT 16 18  ALKPHOS 61 45  BILITOT  --  0.7  PROT  --  7.1  ALBUMIN  --  4.4   No results for input(s): LIPASE, AMYLASE in the last 8760 hours. No results for input(s): AMMONIA in the last 8760 hours. CBC: Recent Labs    11/20/17 1247 11/27/17  WBC 5.9 5.5  HGB 14.8 15.1  HCT 44.2 46  MCV 88.0  --   PLT 207 218   Lipid Panel: Recent Labs    11/27/17 10/01/18 0400  CHOL 133 124  HDL 47 43  LDLCALC 58 59  TRIG 139 114   Lab Results  Component Value Date   HGBA1C 6.8 10/01/2018    Procedures since last visit: No results found.  Assessment/Plan 1. Controlled type 2 diabetes mellitus without complication, without long-term current use of insulin (HCC) -hba1c up in diabetic range again--decrease desserts and wine intake to help with this -he's gained weight also -counseled to have only glass of wine and avoid daily dessert -cont walking  2. Mixed hyperlipidemia -at goal now with current plan despite diet  3. Obesity (BMI 30-39.9) -has gained weight into obese range -see#1  4. BMI 30.0-30.9,adult -noted, counseled on diet and exercise, alcohol decrease  5. Alcohol use -needs to cut down--3 glasses is too much per day--he does not think he can do this  6. Sarcoidosis -gets prednisone tapers for this which do not help his glucose or  appetite -some chronic cough -does NOT have asthma  7. 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 Mindy Behnken, D.O. [facility] [address], [place] [address] Cell Phone (Mon-Fri 8am-5pm):  [phone] On Call:  [phone] & follow prompts after 5pm & weekends Office Phone:  [phone] Office Fax:  [phone]

## 2018-10-10 ENCOUNTER — Telehealth: Payer: Self-pay | Admitting: *Deleted

## 2018-10-10 DIAGNOSIS — Z1211 Encounter for screening for malignant neoplasm of colon: Secondary | ICD-10-CM

## 2018-10-10 NOTE — Telephone Encounter (Signed)
Called pt to advise that we got a copy of his colonoscopy report and that he is due, per report pt was not cleaned out enough. Referral entered to [employer]

## 2018-11-11 ENCOUNTER — Telehealth: Payer: Self-pay | Admitting: Internal Medicine

## 2018-11-11 NOTE — Telephone Encounter (Signed)
[facility] 10/10/18 Dr. Henrene Pastor,  Pt was referred for a colonoscopy.  Pt's previous colonoscopy from [place] in 2017 is in [place] for review.

## 2018-11-19 ENCOUNTER — Encounter: Payer: Self-pay | Admitting: Internal Medicine

## 2018-11-19 NOTE — Telephone Encounter (Signed)
Based on available information that I have reviewed (colonoscopy 2017 but poor prep and follow-up in 2 years recommended), and the PCP request, the patient would be an appropriate candidate for screening colonoscopy.  He should be screened by 1 of the previsit nurses to make sure there are no clinical issues.  Also, given his poor prep previously, I would recommend 1 bottle of magnesium citrate the day prior to the procedure in the morning.  This, to be followed by standard split prep such as Suprep. Dr. Henrene Pastor.

## 2018-11-25 ENCOUNTER — Other Ambulatory Visit: Payer: Self-pay | Admitting: Internal Medicine

## 2018-12-03 ENCOUNTER — Other Ambulatory Visit: Payer: Self-pay

## 2018-12-03 ENCOUNTER — Ambulatory Visit (AMBULATORY_SURGERY_CENTER): Payer: Medicare Other | Admitting: *Deleted

## 2018-12-03 VITALS — Ht 71.0 in | Wt 205.0 lb

## 2018-12-03 DIAGNOSIS — Z86010 Personal history of colonic polyps: Secondary | ICD-10-CM

## 2018-12-03 MED ORDER — SUPREP BOWEL PREP KIT 17.5-3.13-1.6 GM/177ML PO SOLN: 1.0000 | Freq: Once | ORAL | 0 refills | Status: AC

## 2018-12-03 NOTE — Progress Notes (Signed)
Patient denies any allergies to egg or soy products. Patient denies complications with anesthesia/sedation.  Patient denies oxygen use at home and denies diet medications.   Pt verified name, DOB, address and insurance during PV today. Pt mailed instruction packet to included paper to complete and mail back to [facility] with addressed and stamped envelope, Emmi video, copy of consent form to read and not return, and instructions. PV completed over the phone. Pt encouraged to call with questions or issues.   Patient lives at [facility].  Patient informed that someone from the facility would have to come with him and stay here at [facility] during his procedure.  Patient verbalized understanding.

## 2018-12-09 ENCOUNTER — Other Ambulatory Visit: Payer: Self-pay | Admitting: Internal Medicine

## 2018-12-16 ENCOUNTER — Telehealth: Payer: Self-pay | Admitting: Internal Medicine

## 2018-12-16 NOTE — Telephone Encounter (Signed)

## 2018-12-17 ENCOUNTER — Encounter: Payer: Self-pay | Admitting: Internal Medicine

## 2018-12-17 ENCOUNTER — Ambulatory Visit (AMBULATORY_SURGERY_CENTER): Payer: Medicare Other | Admitting: Internal Medicine

## 2018-12-17 ENCOUNTER — Other Ambulatory Visit: Payer: Self-pay

## 2018-12-17 VITALS — BP 109/60 | HR 59 | Temp 98.4°F | Resp 12

## 2018-12-17 DIAGNOSIS — Z1211 Encounter for screening for malignant neoplasm of colon: Secondary | ICD-10-CM | POA: Diagnosis not present

## 2018-12-17 DIAGNOSIS — Z860100 Personal history of colon polyps, unspecified: Secondary | ICD-10-CM

## 2018-12-17 DIAGNOSIS — K5730 Diverticulosis of large intestine without perforation or abscess without bleeding: Secondary | ICD-10-CM

## 2018-12-17 MED ORDER — SODIUM CHLORIDE 0.9 % IV SOLN: 500.0000 mL | Freq: Once | INTRAVENOUS | Status: DC

## 2018-12-17 MED ORDER — FLEET ENEMA 7-19 GM/118ML RE ENEM
1.0000 | ENEMA | Freq: Once | RECTAL | Status: AC
  Administered 2018-12-17: 1 via RECTAL

## 2018-12-17 NOTE — Progress Notes (Signed)
To PACU, VSS. Report to RN.tb 

## 2018-12-17 NOTE — Patient Instructions (Signed)
HANDOUT given for diverticulosis.  YOU HAD AN ENDOSCOPIC PROCEDURE TODAY AT [facility]:   Refer to the procedure report that was given to you for any specific questions about what was found during the examination.  If the procedure report does not answer your questions, please call your gastroenterologist to clarify.  If you requested that your care partner not be given the details of your procedure findings, then the procedure report has been included in a sealed envelope for you to review at your convenience later.  YOU SHOULD EXPECT: Some feelings of bloating in the abdomen. Passage of more gas than usual.  Walking can help get rid of the air that was put into your GI tract during the procedure and reduce the bloating. If you had a lower endoscopy (such as a colonoscopy or flexible sigmoidoscopy) you may notice spotting of blood in your stool or on the toilet paper. If you underwent a bowel prep for your procedure, you may not have a normal bowel movement for a few days.  Please Note:  You might notice some irritation and congestion in your nose or some drainage.  This is from the oxygen used during your procedure.  There is no need for concern and it should clear up in a day or so.  SYMPTOMS TO REPORT IMMEDIATELY:   Following lower endoscopy (colonoscopy or flexible sigmoidoscopy):  Excessive amounts of blood in the stool  Significant tenderness or worsening of abdominal pains  Swelling of the abdomen that is new, acute  Fever of 100F or higher  For urgent or emergent issues, a gastroenterologist can be reached at any hour by calling [phone].   DIET:  We do recommend a small meal at first, but then you may proceed to your regular diet.  Drink plenty of fluids but you should avoid alcoholic beverages for 24 hours.  ACTIVITY:  You should plan to take it easy for the rest of today and you should NOT DRIVE or use heavy machinery until tomorrow (because of the sedation  medicines used during the test).    FOLLOW UP: Our staff will call the number listed on your records 48-72 hours following your procedure to check on you and address any questions or concerns that you may have regarding the information given to you following your procedure. If we do not reach you, we will leave a message.  We will attempt to reach you two times.  During this call, we will ask if you have developed any symptoms of COVID 19. If you develop any symptoms (ie: fever, flu-like symptoms, shortness of breath, cough etc.) before then, please call [phone].  If you test positive for Covid 19 in the 2 weeks post procedure, please call and report this information to [place].    If any biopsies were taken you will be contacted by phone or by letter within the next 1-3 weeks.  Please call us at [phone] if you have not heard about the biopsies in 3 weeks.    SIGNATURES/CONFIDENTIALITY: You and/or your care partner have signed paperwork which will be entered into your electronic medical record.  These signatures attest to the fact that that the information above on your After Visit Summary has been reviewed and is understood.  Full responsibility of the confidentiality of this discharge information lies with you and/or your care-partner.

## 2018-12-17 NOTE — Progress Notes (Signed)
Pt's states no medical or surgical changes since previsit or office visit. 

## 2018-12-17 NOTE — Op Note (Signed)
[facility] Patient Name: Robert Norman Procedure Date: 12/17/2018 8:04 AM MRN: [id number] Endoscopist: Docia Chuck. Robert Norman , MD Age: 73 Referring MD:  Date of Birth: Jul 06, 1945 Gender: Male Account #: [id number] Procedure:                Colonoscopy Indications:              Colon cancer screening. Average risk. Reports                            several previous examinations that were negative                            for neoplasia. Last examination elsewhere reviewed.                            Limited by poor prep thus this interval exam Medicines:                Monitored Anesthesia Care Procedure:                Pre-Anesthesia Assessment:                           - Prior to the procedure, a History and Physical                            was performed, and patient medications and                            allergies were reviewed. The patient's tolerance of                            previous anesthesia was also reviewed. The risks                            and benefits of the procedure and the sedation                            options and risks were discussed with the patient.                            All questions were answered, and informed consent                            was obtained. Prior Anticoagulants: The patient has                            taken no previous anticoagulant or antiplatelet                            agents. ASA Grade Assessment: II - A patient with                            mild systemic disease. After reviewing the risks  and benefits, the patient was deemed in                            satisfactory condition to undergo the procedure.                           After obtaining informed consent, the colonoscope                            was passed under direct vision. Throughout the                            procedure, the patient's blood pressure, pulse, and                            oxygen saturations were  monitored continuously. The                            Colonoscope was introduced through the anus and                            advanced to the the cecum, identified by                            appendiceal orifice and ileocecal valve. The                            ileocecal valve, appendiceal orifice, and rectum                            were photographed. The quality of the bowel                            preparation was good. The colonoscopy was performed                            without difficulty. The patient tolerated the                            procedure well. The bowel preparation used was                            SUPREP via split dose instruction. Scope In: 8:18:18 AM Scope Out: 8:44:19 AM Scope Withdrawal Time: 0 hours 16 minutes 54 seconds  Total Procedure Duration: 0 hours 26 minutes 1 second  Findings:                 Diverticula were found in the sigmoid colon and                            ascending colon.                           The exam was otherwise without abnormality on  direct and retroflexion views. Complications:            No immediate complications. Estimated blood loss:                            None. Estimated Blood Loss:     Estimated blood loss: none. Impression:               - Diverticulosis in the sigmoid colon and in the                            ascending colon.                           - The examination was otherwise normal on direct                            and retroflexion views.                           - No specimens collected. Recommendation:           - Repeat colonoscopy is not recommended due to                            current age (70 years or older) for screening                            purposes.                           - Patient has a contact number available for                            emergencies. The signs and symptoms of potential                            delayed complications  were discussed with the                            patient. Return to normal activities tomorrow.                            Written discharge instructions were provided to the                            patient.                           - Resume previous diet.                           - Continue present medications. Docia Chuck. Robert Pastor, MD 12/17/2018 8:54:52 AM This report has been signed electronically.

## 2018-12-19 ENCOUNTER — Telehealth: Payer: Self-pay

## 2018-12-19 NOTE — Telephone Encounter (Signed)
  Follow up Call-  Call back number 12/17/2018  Post procedure Call Back phone  # [phone]  Permission to leave phone message Yes  Some recent data might be hidden     Patient questions:  Do you have a fever, pain , or abdominal swelling? No. Pain Score  0 *  Have you tolerated food without any problems? Yes.    Have you been able to return to your normal activities? Yes.    Do you have any questions about your discharge instructions: Diet   No. Medications  No. Follow up visit  No.  Do you have questions or concerns about your Care? No.  Actions: * If pain score is 4 or above: No action needed, pain <4.  1. Have you developed a fever since your procedure? no  2.   Have you had an respiratory symptoms (SOB or cough) since your procedure? no 3.   Have you tested positive for COVID 19 since your procedure no  4.   Have you had any family members/close contacts diagnosed with the COVID 19 since your procedure?  no  If yes to any of these questions please route to Joylene John, RN and Alphonsa Gin, Therapist, sports.

## 2018-12-23 DIAGNOSIS — D2239 Melanocytic nevi of other parts of face: Secondary | ICD-10-CM | POA: Diagnosis not present

## 2018-12-23 DIAGNOSIS — L821 Other seborrheic keratosis: Secondary | ICD-10-CM | POA: Diagnosis not present

## 2018-12-23 DIAGNOSIS — L570 Actinic keratosis: Secondary | ICD-10-CM | POA: Diagnosis not present

## 2019-01-27 ENCOUNTER — Other Ambulatory Visit: Payer: Self-pay | Admitting: Internal Medicine

## 2019-02-04 DIAGNOSIS — E119 Type 2 diabetes mellitus without complications: Secondary | ICD-10-CM | POA: Diagnosis not present

## 2019-02-04 DIAGNOSIS — R350 Frequency of micturition: Secondary | ICD-10-CM | POA: Diagnosis not present

## 2019-02-04 DIAGNOSIS — E782 Mixed hyperlipidemia: Secondary | ICD-10-CM | POA: Diagnosis not present

## 2019-02-04 DIAGNOSIS — C61 Malignant neoplasm of prostate: Secondary | ICD-10-CM | POA: Diagnosis not present

## 2019-02-04 LAB — BASIC METABOLIC PANEL
BUN: 15 (ref 4–21)
Creatinine: 1 (ref 0.6–1.3)
Glucose: 145
Potassium: 5.1 (ref 3.4–5.3)
Sodium: 139 (ref 137–147)

## 2019-02-04 LAB — PSA: PSA: 0.88

## 2019-02-04 LAB — HEMOGLOBIN A1C: Hemoglobin A1C: 6

## 2019-02-04 LAB — HEPATIC FUNCTION PANEL
ALT: 17 (ref 10–40)
AST: 15 (ref 14–40)
Alkaline Phosphatase: 55 (ref 25–125)
Bilirubin, Total: 0.4

## 2019-02-04 LAB — CBC AND DIFFERENTIAL
HCT: 43 (ref 41–53)
Hemoglobin: 14.7 (ref 13.5–17.5)
Platelets: 172 (ref 150–399)
WBC: 4.3

## 2019-02-07 ENCOUNTER — Encounter: Payer: Self-pay | Admitting: Internal Medicine

## 2019-02-12 ENCOUNTER — Non-Acute Institutional Stay: Payer: Medicare Other | Admitting: Internal Medicine

## 2019-02-12 ENCOUNTER — Other Ambulatory Visit: Payer: Self-pay

## 2019-02-12 ENCOUNTER — Encounter: Payer: Self-pay | Admitting: Internal Medicine

## 2019-02-12 VITALS — BP 114/78 | HR 74 | Temp 98.4°F | Ht 71.0 in | Wt 203.0 lb

## 2019-02-12 DIAGNOSIS — Z7289 Other problems related to lifestyle: Secondary | ICD-10-CM

## 2019-02-12 DIAGNOSIS — E119 Type 2 diabetes mellitus without complications: Secondary | ICD-10-CM

## 2019-02-12 DIAGNOSIS — E782 Mixed hyperlipidemia: Secondary | ICD-10-CM

## 2019-02-12 DIAGNOSIS — D869 Sarcoidosis, unspecified: Secondary | ICD-10-CM

## 2019-02-12 DIAGNOSIS — Z789 Other specified health status: Secondary | ICD-10-CM

## 2019-02-12 DIAGNOSIS — M722 Plantar fascial fibromatosis: Secondary | ICD-10-CM

## 2019-02-12 DIAGNOSIS — Z1159 Encounter for screening for other viral diseases: Secondary | ICD-10-CM

## 2019-02-12 NOTE — Progress Notes (Signed)
Location:  Occupational psychologist of Service:  Clinic (12)  Provider: Safwan Tomei L. Mariea Clonts, D.O., C.M.D.  Goals of Care:  Advanced Directives 02/12/2019  Does Patient Have a Medical Advance Directive? Yes  Type of Advance Directive [place]  Does patient want to make changes to medical advance directive? No - Patient declined  Copy of [employer] in Chart? Yes - validated most recent copy scanned in chart (See row information)  Would patient like information on creating a medical advance directive? -   Chief Complaint  Patient presents with  . Medical Management of Chronic Issues    4 month follow-up and discuss labs (copy available for patient). Foot concerns and possible cataract surgey   . Best Practice Recommendations    Discuss need for Hep C screening and Foot Exam   . Immunizations    Flu vaccine- will get at [facility] in October. Discuss need for PNA    . Quality Metric Gaps    MALB due    HPI: Patient is a 73 y.o. male seen today for medical management of chronic diseases.    He has lost some weight and his labs have improved considerably.  He did have his weight under 200 lbs two weeks ago.  He had some problems with his feet.  He asks what diabetic foot problems are like.  He previously had custom orthotics for his feet 15 years ago.  He's been wearing sketchers and started walking a lot and going up steps (three flights).  He was up to 240 steps.  He was also walking 45 mins.  When he'd get up in the morning, his feet were sore to touch the floor.  He's begun wearing socks at night b/c his feet are cold.  He's not been walking and began using his orthotics in the sketchers.  The feet are also sore when nonweightbearing.    He's had a few glasses of wine in the past few weeks, but he's down to one glass with dinner instead of 3-4 and he's avoiding desserts here.    Had his cscope with Dr. Henrene Pastor.   Past Medical History:   Diagnosis Date  . Asthma   . Calcific Achilles tendonitis    bilateral  . Controlled type 2 diabetes mellitus without complication ([place])   . DDD (degenerative disc disease), cervical   . Gallstone 09/11/2011  . GERD (gastroesophageal reflux disease)   . Hyperlipidemia    mixed  . Insomnia   . Mild cognitive impairment   . Overweight (BMI 25.0-29.9)   . Prostate cancer ([place])   . Pulmonary nodule/lesion, solitary 06/2004   f/u in 2014, 2015 stable in [facility]  . Sleep apnea   . Slow transit constipation   . Vitamin D deficiency     Past Surgical History:  Procedure Laterality Date  . CERVICAL FUSION  1999 & 2004   implant metal plate  . CHOLECYSTECTOMY  2012  . COLONOSCOPY  12/2015  . EYE SURGERY  1997   laser vision correction  . palatouvulopasty  1995  . PROSTATE SURGERY  09/11/2011   prostatectomy for prostate cancer   . TONSILLECTOMY    . WISDOM TOOTH EXTRACTION      Allergies  Allergen Reactions  . Tetracyclines & Related Other (See Comments)    hepatitis  . Niacin And Related Other (See Comments)    headache  . Wellbutrin [Bupropion] Other (See Comments)    dizziness  Outpatient Encounter Medications as of 02/12/2019  Medication Sig  . Coenzyme Q10 (CO Q 10) 100 MG CAPS Take 2 capsules by mouth daily.  Marland Kitchen glucose blood (FREESTYLE LITE) test strip Use to test blood sugar Daily. Dx: E11.9  . Lancets (FREESTYLE) lancets Use to test blood sugar daily. Dx E11.9  . metFORMIN (GLUCOPHAGE-XR) 500 MG 24 hr tablet one with breakfast and one with evening meal  . polyethylene glycol (MIRALAX / GLYCOLAX) packet Take 17 g by mouth daily.  . simvastatin (ZOCOR) 40 MG tablet TAKE 1 TABLET ONCE DAILY IN THE EVENING.  . tadalafil (CIALIS) 20 MG tablet TAKE ONE AND ONE-HALF TABLET BY MOUTH DAILY AS NEEDED ERECTILE DYSFUNCTION  . Vitamin D, Ergocalciferol, (DRISDOL) 1.25 MG (50000 UT) CAPS capsule TAKE 1 CAPSULE ONCE A WEEK.   No facility-administered encounter medications on  file as of 02/12/2019.     Review of Systems:  Review of Systems  Constitutional: Positive for weight loss. Negative for chills, fever and malaise/fatigue.  HENT: Positive for hearing loss. Negative for congestion.   Eyes: Negative for blurred vision.  Respiratory: Negative for cough and shortness of breath.   Cardiovascular: Negative for chest pain, palpitations and leg swelling.  Gastrointestinal: Negative for abdominal pain, blood in stool, constipation, diarrhea and melena.  Genitourinary: Negative for dysuria, frequency and urgency.  Musculoskeletal: Negative for falls and joint pain.       Foot pain  Skin: Negative for itching and rash.  Neurological: Negative for dizziness, tingling, sensory change and loss of consciousness.  Endo/Heme/Allergies: Positive for environmental allergies. Bruises/bleeds easily.  Psychiatric/Behavioral: Negative for depression and memory loss. The patient is not nervous/anxious and does not have insomnia.     Health Maintenance  Topic Date Due  . Hepatitis C Screening  April 06, 1946  . PNA vac Low Risk Adult (2 of 2 - PPSV23) 08/04/2017  . FOOT EXAM  11/27/2017  . URINE MICROALBUMIN  11/28/2018  . INFLUENZA VACCINE  12/28/2018  . OPHTHALMOLOGY EXAM  07/10/2019  . HEMOGLOBIN A1C  08/04/2019  . TETANUS/TDAP  05/07/2027  . COLONOSCOPY  12/16/2028    Physical Exam: Vitals:   02/12/19 1102  BP: 114/78  Pulse: 74  Temp: 98.4 F (36.9 C)  TempSrc: Oral  SpO2: 93%  Weight: 203 lb (92.1 kg)  Height: 5\' 11"  (1.803 m)   Body mass index is 28.31 kg/m. Physical Exam Vitals signs reviewed.  Constitutional:      General: He is not in acute distress.    Appearance: Normal appearance. He is not toxic-appearing.  HENT:     Head: Normocephalic and atraumatic.     Right Ear: External ear normal.     Left Ear: External ear normal.  Cardiovascular:     Rate and Rhythm: Normal rate and regular rhythm.     Pulses: Normal pulses.     Heart sounds:  Normal heart sounds.  Pulmonary:     Effort: Pulmonary effort is normal.     Breath sounds: Normal breath sounds.  Abdominal:     General: Bowel sounds are normal.  Musculoskeletal: Normal range of motion.     Right lower leg: No edema.     Left lower leg: No edema.     Comments: Diabetic foot exam was performed with the following findings:   No deformities, ulcerations, or other skin breakdown Normal sensation of 10g monofilament Intact posterior tibialis and dorsalis pedis pulses     Skin:    General: Skin is warm and dry.  Capillary Refill: Capillary refill takes less than 2 seconds.     Coloration: Skin is pale.  Neurological:     General: No focal deficit present.     Mental Status: He is alert and oriented to person, place, and time.     Cranial Nerves: No cranial nerve deficit.     Sensory: No sensory deficit.     Motor: No weakness.     Coordination: Coordination normal.     Gait: Gait normal.     Deep Tendon Reflexes: Reflexes normal.  Psychiatric:        Mood and Affect: Mood normal.        Behavior: Behavior normal.        Thought Content: Thought content normal.        Judgment: Judgment normal.     Labs reviewed: Basic Metabolic Panel: Recent Labs    06/06/18 10/01/18 0400 02/04/19 0500  NA 140 136* 139  K 4.8 4.6 5.1  BUN 14 16 15   CREATININE 0.9 1.0 1.0   Liver Function Tests: Recent Labs    02/04/19 0500  AST 15  ALT 17  ALKPHOS 55   No results for input(s): LIPASE, AMYLASE in the last 8760 hours. No results for input(s): AMMONIA in the last 8760 hours. CBC: Recent Labs    02/04/19 0500  WBC 4.3  HGB 14.7  HCT 43  PLT 172   Lipid Panel: Recent Labs    10/01/18 0400  CHOL 124  HDL 43  LDLCALC 59  TRIG 114   Lab Results  Component Value Date   HGBA1C 6.0 02/04/2019    Procedures since last visit: cscope with Dr. Henrene Pastor  Assessment/Plan 1. Controlled type 2 diabetes mellitus without complication, without long-term  current use of insulin (HCC) -hba1c down in prediabetic range again :) -continue metformin therapy bid with meals -gradually get back to the walking that helped lower the hba1c and helped him lose weight--avoid the stairclimbing for now until feet feel better  2. Mixed hyperlipidemia -cont zocor therapy -lipids improving  3. Alcohol use -has cut down considerably to safe range now of one glass with dinner and not even nightly now  4. Plantar fasciitis, bilateral -recommended am stretches and tennis ball exercises to help with pain in feet when he gets up in the mornings -also may need new orthotics--given name of [employer] if he opts to pursue this -avoid stairclimbing for now, but gradually resume walking as pain improves -denies neuropathic pain and monofilament sensation intact  5. Sarcoidosis -no active concerns  6. Encounter for hepatitis C screening test for low risk patient -will check hep c screening test one time due to baby boomer age group  Labs/tests ordered:  Bmp, hba1c, urine micro, hepatitis C screen  Next appt:  6 mos with fasting labs before  Yuka Lallier L. Emilyanne Mcgough, D.O. [facility] [address], [place] [address] Cell Phone (Mon-Fri 8am-5pm):  [phone] On Call:  [phone] & follow prompts after 5pm & weekends Office Phone:  [phone] Office Fax:  [phone]

## 2019-02-23 ENCOUNTER — Other Ambulatory Visit: Payer: Self-pay | Admitting: Internal Medicine

## 2019-03-05 DIAGNOSIS — H2513 Age-related nuclear cataract, bilateral: Secondary | ICD-10-CM | POA: Diagnosis not present

## 2019-03-05 DIAGNOSIS — H25013 Cortical age-related cataract, bilateral: Secondary | ICD-10-CM | POA: Diagnosis not present

## 2019-03-05 DIAGNOSIS — H2511 Age-related nuclear cataract, right eye: Secondary | ICD-10-CM | POA: Diagnosis not present

## 2019-03-05 DIAGNOSIS — H25043 Posterior subcapsular polar age-related cataract, bilateral: Secondary | ICD-10-CM | POA: Diagnosis not present

## 2019-03-05 DIAGNOSIS — E119 Type 2 diabetes mellitus without complications: Secondary | ICD-10-CM | POA: Diagnosis not present

## 2019-03-05 LAB — HM DIABETES EYE EXAM

## 2019-03-13 DIAGNOSIS — Z23 Encounter for immunization: Secondary | ICD-10-CM | POA: Diagnosis not present

## 2019-03-18 DIAGNOSIS — G4733 Obstructive sleep apnea (adult) (pediatric): Secondary | ICD-10-CM | POA: Diagnosis not present

## 2019-04-02 ENCOUNTER — Encounter: Payer: Self-pay | Admitting: Family

## 2019-04-02 ENCOUNTER — Other Ambulatory Visit: Payer: Self-pay

## 2019-04-02 ENCOUNTER — Ambulatory Visit (INDEPENDENT_AMBULATORY_CARE_PROVIDER_SITE_OTHER): Payer: Medicare Other | Admitting: Family

## 2019-04-02 DIAGNOSIS — Z0000 Encounter for general adult medical examination without abnormal findings: Secondary | ICD-10-CM | POA: Diagnosis not present

## 2019-04-02 DIAGNOSIS — E119 Type 2 diabetes mellitus without complications: Secondary | ICD-10-CM

## 2019-04-02 NOTE — Progress Notes (Signed)
This service is provided via telemedicine  No vital signs collected/recorded due to the encounter was a telemedicine visit.   Location of patient (ex: home, work):  Home   Patient consents to a telephone visit:  Yes  Location of the provider (ex: office, home):  Office   Name of any referring provider: Loma Boston   Names of all persons participating in the telemedicine service and their role in the encounter:  Dinah Ngetich NP, Ruthell Rummage CMA, Driscilla Grammes   Time spent on call:  Ruthell Rummage CMA spent 10 minutes on phone with patient.

## 2019-04-02 NOTE — Progress Notes (Signed)
Subjective:   Robert Norman is a 73 y.o. male who presents for Medicare Annual/Subsequent preventive examination.  Review of Systems:  Cardiac Risk Factors include: advanced age (>60men, >40 women);diabetes mellitus;male gender;dyslipidemia     Objective:    Vitals: There were no vitals taken for this visit.  There is no height or weight on file to calculate BMI.  Advanced Directives 04/02/2019 02/12/2019 10/09/2018 12/05/2017 12/04/2017 11/20/2017 09/19/2017  Does Patient Have a Medical Advance Directive? Yes Yes Yes Yes Yes Yes Yes  Type of [place] administrator Power of [place];Living will [place];Living will [place]  Does patient want to make changes to medical advance directive? No - Patient declined No - Patient declined No - Patient declined No - Patient declined No - Patient declined No - Patient declined No - Patient declined  Copy of [place] in Chart? Yes - validated most recent copy scanned in chart (See row information) Yes - validated most recent copy scanned in chart (See row information) Yes - validated most recent copy scanned in chart (See row information) Yes Yes - Yes  Would patient like information on creating a medical advance directive? - - No - Patient declined - - - -    Tobacco Social History   Tobacco Use  Smoking Status Never Smoker  Smokeless Tobacco Never Used     Counseling given: Not Answered  Clinical Intake:  Pre-visit preparation completed: No  Pain : No/denies pain  BMI - recorded: 28.3 Nutritional Status: BMI 25 -29 Overweight Nutritional Risks: None Diabetes: Yes CBG done?: No Did pt. bring in CBG monitor from home?: No(does check CBG)  How often do you need to have someone help you when you read instructions, pamphlets, or other written materials from  your doctor or pharmacy?: 1 - Never What is the last grade level you completed in school?: Graduate school  Interpreter Needed?: No  Information entered by :: Atara Paterson FNP-C  Past Medical History:  Diagnosis Date  . Asthma   . Calcific Achilles tendonitis    bilateral  . Controlled type 2 diabetes mellitus without complication ([place])   . DDD (degenerative disc disease), cervical   . Gallstone 09/11/2011  . GERD (gastroesophageal reflux disease)   . Hyperlipidemia    mixed  . Insomnia   . Mild cognitive impairment   . Overweight (BMI 25.0-29.9)   . Prostate cancer ([place])   . Pulmonary nodule/lesion, solitary 06/2004   f/u in 2014, 2015 stable in [facility]  . Sleep apnea   . Slow transit constipation   . Vitamin D deficiency    Past Surgical History:  Procedure Laterality Date  . CERVICAL FUSION  1999 & 2004   implant metal plate  . CHOLECYSTECTOMY  2012  . COLONOSCOPY  12/2015  . EYE SURGERY  1997   laser vision correction  . palatouvulopasty  1995  . PROSTATE SURGERY  09/11/2011   prostatectomy for prostate cancer   . TONSILLECTOMY    . WISDOM TOOTH EXTRACTION     Family History  Problem Relation Age of Onset  . Cancer Mother   . Heart disease Father   . Heart disease Brother   . Diabetes Brother   . Colon cancer Neg Hx   . Rectal cancer Neg Hx   . Stomach cancer Neg Hx   . Esophageal cancer Neg Hx  Social History   Socioeconomic History  . Marital status: Widowed    Spouse name: Not on file  . Number of children: Not on file  . Years of education: Not on file  . Highest education level: Not on file  Occupational History  . Occupation: Press photographer    Comment: Agricultural consultant  Social Needs  . Financial resource strain: Not hard at all  . Food insecurity    Worry: Never true    Inability: Never true  . Transportation needs    Medical: No    Non-medical: No  Tobacco Use  . Smoking status: Never Smoker  . Smokeless tobacco: Never Used   Substance and Sexual Activity  . Alcohol use: Not Currently  . Drug use: No  . Sexual activity: Yes  Lifestyle  . Physical activity    Days per week: 7 days    Minutes per session: 60 min  . Stress: Not at all  Relationships  . Social connections    Talks on phone: More than three times a week    Gets together: More than three times a week    Attends religious service: More than 4 times per year    Active member of club or organization: No    Attends meetings of clubs or organizations: Never    Relationship status: Widowed  Other Topics Concern  . Not on file  Social History Narrative   Tobacco use, amount per day now: NEVER USED   Past tobacco use, amount per day: NEVER USED   How many years did you use tobacco: NONE   Alcohol use (drinks per week): 10 GLASSES OF WINE A WEEK   Diet:   Do you drink/eat things with caffeine: YES   Marital status:  WIDOWED                    What year were you married?   Do you live in a house, apartment, assisted living, condo, trailer, etc.? APARTMENT   Is it one or more stories? ONE STORY   How many persons live in your home? NONE   Do you have pets in your home?( please list) NO   Current or past profession: FINANCIAL   Do you exercise?  YES                                Type and how often? WALKING 1-2 HOURS   Do you have a living will? YES   Do you have a DNR form?    NO                               If not, do you want to discuss one?   Do you have signed POA/HPOA forms?   YES                     If so, please bring to you appointment    Outpatient Encounter Medications as of 04/02/2019  Medication Sig  . Coenzyme Q10 (CO Q 10) 100 MG CAPS Take 2 capsules by mouth daily.  Marland Kitchen glucose blood (FREESTYLE LITE) test strip Use to test blood sugar Daily. Dx: E11.9  . Lancets (FREESTYLE) lancets Use to test blood sugar daily. Dx E11.9  . metFORMIN (GLUCOPHAGE-XR) 500 MG 24 hr tablet Take 1 tablet with breakfast and 1 tablet with dinner.  Marland Kitchen  polyethylene glycol (MIRALAX / GLYCOLAX) packet Take 17 g by mouth daily.  . simvastatin (ZOCOR) 40 MG tablet TAKE 1 TABLET ONCE DAILY IN THE EVENING.  . tadalafil (CIALIS) 20 MG tablet TAKE 1 AND 1/2 TABLET BY MOUTH DAILY AS NEEDED FOR ERECTILE DYSFUNCTION  . Vitamin D, Ergocalciferol, (DRISDOL) 1.25 MG (50000 UT) CAPS capsule TAKE 1 CAPSULE ONCE A WEEK.   No facility-administered encounter medications on file as of 04/02/2019.     Activities of Daily Living In your present state of health, do you have any difficulty performing the following activities: 04/02/2019  Hearing? Y  Comment wears hearing aids  Vision? Y  Comment cataract  Difficulty concentrating or making decisions? N  Walking or climbing stairs? N  Dressing or bathing? N  Doing errands, shopping? N  Preparing Food and eating ? N  Using the Toilet? N  In the past six months, have you accidently leaked urine? N  Do you have problems with loss of bowel control? N  Managing your Medications? N  Managing your Finances? N  Housekeeping or managing your Housekeeping? N  Some recent data might be hidden    Patient Care Team: [name], DO as PCP - General (Geriatric Medicine) Christain Sacramento, OD as Referring Physician (Optometry)   Assessment:   This is a routine wellness examination for [place].  Exercise Activities and Dietary recommendations Current Exercise Habits: Home exercise routine, Type of exercise: stretching;walking, Time (Minutes): 60, Frequency (Times/Week): 6, Weekly Exercise (Minutes/Week): 360, Intensity: Mild, Exercise limited by: Other - see comments(neck injury)  Goals    . Weight (lb) < 200 lb (90.7 kg)     I would like to loss weight down to 195 lbs        Fall Risk Fall Risk  04/02/2019 02/12/2019 10/09/2018 06/12/2018 12/05/2017  Falls in the past year? 0 0 0 0 No  Number falls in past yr: 0 0 0 0 -  Injury with Fall? 0 0 0 0 -   Is the patient's home free of loose throw rugs in walkways,  pet beds, electrical cords, etc?   no      Grab bars in the bathroom? yes      Handrails on the stairs?   no      Adequate lighting?   yes  Depression Screen PHQ 2/9 Scores 04/02/2019 10/09/2018 06/12/2018 12/05/2017  PHQ - 2 Score 0 0 0 0    Cognitive Function MMSE - Mini Mental State Exam 12/04/2017  Orientation to time 4  Orientation to Place 5  Registration 3  Attention/ Calculation 5  Recall 2  Language- name 2 objects 2  Language- repeat 1  Language- follow 3 step command 3  Language- read & follow direction 1  Write a sentence 1  Copy design 1  Total score 28     6CIT Screen 04/02/2019  What Year? 0 points  What month? 0 points  What time? 0 points  Count back from 20 0 points  Months in reverse 0 points  Repeat phrase 4 points  Total Score 4    Immunization History  Administered Date(s) Administered  . Influenza, High Dose Seasonal PF 03/04/2018, 03/07/2019  . Influenza-Unspecified 03/12/2014, 03/02/2016, 02/26/2017  . Pneumococcal Conjugate-13 08/04/2016  . Pneumococcal Polysaccharide-23 04/05/2010  . Td 05/06/2017  . Tdap 03/12/2014  . Zoster 07/19/2007  . Zoster Recombinat (Shingrix) 02/26/2017, 03/29/2017    Qualifies for Shingles Vaccine? Up to date   Screening Tests Health Maintenance  Topic Date Due  . Hepatitis C Screening  12/08/1945  . PNA vac Low Risk Adult (2 of 2 - PPSV23) 08/04/2017  . URINE MICROALBUMIN  11/28/2018  . OPHTHALMOLOGY EXAM  07/10/2019  . HEMOGLOBIN A1C  08/04/2019  . FOOT EXAM  02/12/2020  . TETANUS/TDAP  05/07/2027  . COLONOSCOPY  12/16/2028  . INFLUENZA VACCINE  Completed   Cancer Screenings: Lung: Low Dose CT Chest recommended if Age 78-80 years, 30 pack-year currently smoking OR have quit w/in 15years. Patient does not qualify. Colorectal:Up to date   Additional Screenings:  Hepatitis C Screening: Low Risk    Plan:  - PNA  23 vaccine scheduled for 04/17/2019  - Urine Microalbuminuria scheduled for 04/17/2019    I have personally reviewed and noted the following in the patient's chart:   . Medical and social history . Use of alcohol, tobacco or illicit drugs  . Current medications and supplements . Functional ability and status . Nutritional status . Physical activity . Advanced directives . List of other physicians . Hospitalizations, surgeries, and ER visits in previous 12 months . Vitals . Screenings to include cognitive, depression, and falls . Referrals and appointments  In addition, I have reviewed and discussed with patient certain preventive protocols, quality metrics, and best practice recommendations. A written personalized care plan for preventive services as well as general preventive health recommendations were provided to patient.  Sandrea Hughs, NP  04/02/2019

## 2019-04-02 NOTE — Patient Instructions (Signed)
Robert Norman , Thank you for taking time to come for your Medicare Wellness Visit. I appreciate your ongoing commitment to your health goals. Please review the following plan we discussed and let me know if I can assist you in the future.   Screening recommendations/referrals: Colonoscopy: Up to date  Recommended yearly ophthalmology/optometry visit for glaucoma screening and checkup Recommended yearly dental visit for hygiene and checkup  Vaccinations: Influenza vaccine: Up to date  Pneumococcal vaccine: Scheduled for 04/17/2019  Tdap vaccine: Up to date  Shingles vaccine : Up to date   Advanced directives: Yes   Conditions/risks identified: Advance age men > 26 yrs,Type 2 DM,male Gender,Dyslipidemia    Next appointment: 1 year   Preventive Care 73 Years and Older, Male Preventive care refers to lifestyle choices and visits with your health care provider that can promote health and wellness. What does preventive care include?  A yearly physical exam. This is also called an annual well check.  Dental exams once or twice a year.  Routine eye exams. Ask your health care provider how often you should have your eyes checked.  Personal lifestyle choices, including:  Daily care of your teeth and gums.  Regular physical activity.  Eating a healthy diet.  Avoiding tobacco and drug use.  Limiting alcohol use.  Practicing safe sex.  Taking low doses of aspirin every day.  Taking vitamin and mineral supplements as recommended by your health care provider. What happens during an annual well check? The services and screenings done by your health care provider during your annual well check will depend on your age, overall health, lifestyle risk factors, and family history of disease. Counseling  Your health care provider may ask you questions about your:  Alcohol use.  Tobacco use.  Drug use.  Emotional well-being.  Home and relationship well-being.  Sexual activity.   Eating habits.  History of falls.  Memory and ability to understand (cognition).  Work and work Statistician. Screening  You may have the following tests or measurements:  Height, weight, and BMI.  Blood pressure.  Lipid and cholesterol levels. These may be checked every 5 years, or more frequently if you are over 73 years old.  Skin check.  Lung cancer screening. You may have this screening every year starting at age 73 if you have a 30-pack-year history of smoking and currently smoke or have quit within the past 15 years.  Fecal occult blood test (FOBT) of the stool. You may have this test every year starting at age 73.  Flexible sigmoidoscopy or colonoscopy. You may have a sigmoidoscopy every 5 years or a colonoscopy every 10 years starting at age 73.  Prostate cancer screening. Recommendations will vary depending on your family history and other risks.  Hepatitis C blood test.  Hepatitis B blood test.  Sexually transmitted disease (STD) testing.  Diabetes screening. This is done by checking your blood sugar (glucose) after you have not eaten for a while (fasting). You may have this done every 1-3 years.  Abdominal aortic aneurysm (AAA) screening. You may need this if you are a current or former smoker.  Osteoporosis. You may be screened starting at age 73 if you are at high risk. Talk with your health care provider about your test results, treatment options, and if necessary, the need for more tests. Vaccines  Your health care provider may recommend certain vaccines, such as:  Influenza vaccine. This is recommended every year.  Tetanus, diphtheria, and acellular pertussis (Tdap, Td) vaccine.  You may need a Td booster every 10 years.  Zoster vaccine. You may need this after age 73.  Pneumococcal 13-valent conjugate (PCV13) vaccine. One dose is recommended after age 73.  Pneumococcal polysaccharide (PPSV23) vaccine. One dose is recommended after age 73. Talk to your  health care provider about which screenings and vaccines you need and how often you need them. This information is not intended to replace advice given to you by your health care provider. Make sure you discuss any questions you have with your health care provider. Document Released: 06/11/2015 Document Revised: 02/02/2016 Document Reviewed: 03/16/2015 Elsevier Interactive Patient Education  2017 [place] Prevention in the Home Falls can cause injuries. They can happen to people of all ages. There are many things you can do to make your home safe and to help prevent falls. What can I do on the outside of my home?  Regularly fix the edges of walkways and driveways and fix any cracks.  Remove anything that might make you trip as you walk through a door, such as a raised step or threshold.  Trim any bushes or trees on the path to your home.  Use bright outdoor lighting.  Clear any walking paths of anything that might make someone trip, such as rocks or tools.  Regularly check to see if handrails are loose or broken. Make sure that both sides of any steps have handrails.  Any raised decks and porches should have guardrails on the edges.  Have any leaves, snow, or ice cleared regularly.  Use sand or salt on walking paths during winter.  Clean up any spills in your garage right away. This includes oil or grease spills. What can I do in the bathroom?  Use night lights.  Install grab bars by the toilet and in the tub and shower. Do not use towel bars as grab bars.  Use non-skid mats or decals in the tub or shower.  If you need to sit down in the shower, use a plastic, non-slip stool.  Keep the floor dry. Clean up any water that spills on the floor as soon as it happens.  Remove soap buildup in the tub or shower regularly.  Attach bath mats securely with double-sided non-slip rug tape.  Do not have throw rugs and other things on the floor that can make you trip. What  can I do in the bedroom?  Use night lights.  Make sure that you have a light by your bed that is easy to reach.  Do not use any sheets or blankets that are too big for your bed. They should not hang down onto the floor.  Have a firm chair that has side arms. You can use this for support while you get dressed.  Do not have throw rugs and other things on the floor that can make you trip. What can I do in the kitchen?  Clean up any spills right away.  Avoid walking on wet floors.  Keep items that you use a lot in easy-to-reach places.  If you need to reach something above you, use a strong step stool that has a grab bar.  Keep electrical cords out of the way.  Do not use floor polish or wax that makes floors slippery. If you must use wax, use non-skid floor wax.  Do not have throw rugs and other things on the floor that can make you trip. What can I do with my stairs?  Do not leave any  items on the stairs.  Make sure that there are handrails on both sides of the stairs and use them. Fix handrails that are broken or loose. Make sure that handrails are as long as the stairways.  Check any carpeting to make sure that it is firmly attached to the stairs. Fix any carpet that is loose or worn.  Avoid having throw rugs at the top or bottom of the stairs. If you do have throw rugs, attach them to the floor with carpet tape.  Make sure that you have a light switch at the top of the stairs and the bottom of the stairs. If you do not have them, ask someone to add them for you. What else can I do to help prevent falls?  Wear shoes that:  Do not have high heels.  Have rubber bottoms.  Are comfortable and fit you well.  Are closed at the toe. Do not wear sandals.  If you use a stepladder:  Make sure that it is fully opened. Do not climb a closed stepladder.  Make sure that both sides of the stepladder are locked into place.  Ask someone to hold it for you, if possible.   Clearly mark and make sure that you can see:  Any grab bars or handrails.  First and last steps.  Where the edge of each step is.  Use tools that help you move around (mobility aids) if they are needed. These include:  Canes.  Walkers.  Scooters.  Crutches.  Turn on the lights when you go into a dark area. Replace any light bulbs as soon as they burn out.  Set up your furniture so you have a clear path. Avoid moving your furniture around.  If any of your floors are uneven, fix them.  If there are any pets around you, be aware of where they are.  Review your medicines with your doctor. Some medicines can make you feel dizzy. This can increase your chance of falling. Ask your doctor what other things that you can do to help prevent falls. This information is not intended to replace advice given to you by your health care provider. Make sure you discuss any questions you have with your health care provider. Document Released: 03/11/2009 Document Revised: 10/21/2015 Document Reviewed: 06/19/2014 Elsevier Interactive Patient Education  2017 [employer].

## 2019-04-17 ENCOUNTER — Other Ambulatory Visit: Payer: Medicare Other

## 2019-04-17 ENCOUNTER — Ambulatory Visit (INDEPENDENT_AMBULATORY_CARE_PROVIDER_SITE_OTHER): Payer: Medicare Other

## 2019-04-17 ENCOUNTER — Other Ambulatory Visit: Payer: Self-pay

## 2019-04-17 DIAGNOSIS — E119 Type 2 diabetes mellitus without complications: Secondary | ICD-10-CM

## 2019-04-17 DIAGNOSIS — Z23 Encounter for immunization: Secondary | ICD-10-CM

## 2019-04-18 LAB — MICROALBUMIN / CREATININE URINE RATIO
Creatinine, Urine: 39 mg/dL (ref 20–320)
Microalb, Ur: <0.2 mg/dL

## 2019-04-19 ENCOUNTER — Other Ambulatory Visit: Payer: Self-pay | Admitting: Internal Medicine

## 2019-05-16 ENCOUNTER — Other Ambulatory Visit: Payer: Self-pay

## 2019-05-16 MED ORDER — VITAMIN D (ERGOCALCIFEROL) 1.25 MG (50000 UNIT) PO CAPS: ORAL_CAPSULE | ORAL | 1 refills | Status: DC

## 2019-05-28 IMAGING — CT NM PET TUM IMG INITIAL (PI) SKULL BASE T - THIGH
1 of 8 series · 1 of 25 positions shown · non-contrast
Comparison: Chest CT 10/19/2017

CLINICAL DATA: Initial treatment strategy for pulmonary nodules.

EXAM:
NUCLEAR MEDICINE PET SKULL BASE TO THIGH
TECHNIQUE: 9.9 mCi F-18 FDG was injected intravenously. Full-ring PET imaging
was performed from the skull base to thigh after the radiotracer. CT
data was obtained and used for attenuation correction and anatomic
localization.
Fasting blood glucose: 120 mg/dl

[Series 4: ct sk_thigh 5.0 b31f · axial · 5.0mm · 0.98mm/px · 1 of 252 slices shown]
[im 252/252  brain]
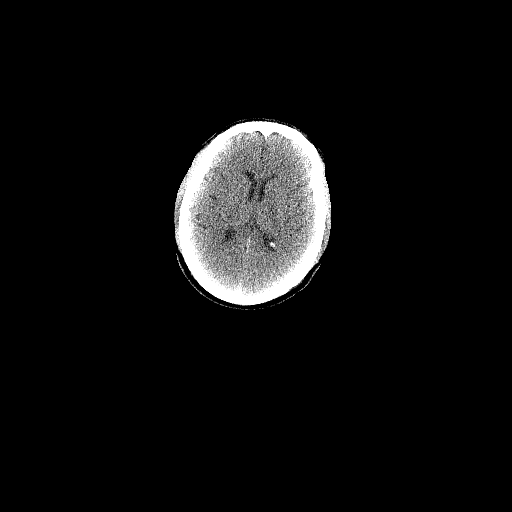

[1 of 25 positions shown; findings below may reference images not displayed]

FINDINGS: Mediastinal blood pool activity: SUV max

NECK: 7 mm level 3 lymph node on the right side on image number 42
is hypermetabolic with SUV max of 13.13. 6 mm level 3 lymph node on
image number 44 has an SUV max of 7.73.

Small right supraclavicular nodes are also mildly hypermetabolic
with SUV max of 5.0.

No neck mass or mucosal lesions are identified.

Incidental CT findings: none

CHEST: 7 mm right axillary lymph node is hypermetabolic with SUV max
of 7.7.

Extensive hypermetabolic mediastinal and hilar lymphadenopathy.
Cluster of paratracheal and aorticopulmonary window nodes have an
SUV max of 1[REDACTED] mm right hilar lymph node has an SUV max of 8.67.

Numerous pulmonary nodules as demonstrated on the prior CT. The
larger nodules are hypermetabolic. 12 mm right upper lobe pulmonary
nodule on image 38 of the lung windows has an SUV max of 6.8. 6 mm
nodule in the right upper lobe on image number 22 has an SUV max of
[REDACTED] mm left lower lobe pulmonary nodule on image number 45 is an SUV
max of

Incidental CT findings: none

ABDOMEN/PELVIS: No focal hepatic lesions or areas of
hypermetabolism. Spleen demonstrates several areas of moderate
hypermetabolism without obvious lesions on the CT scan. FINDINGS
till suspicious for splenic metastasis. The pancreas, adrenal glands
and kidneys are unremarkable.

Fairly extensive abdominal lymphadenopathy. Periportal and
hepatoduodenal ligament lymph nodes are hypermetabolic. 9 mm lymph
node between the duodenum and IVC on image number 125 has an SUV max
of 11.6.

7 mm lymph node near the base of the celiac axis on image number 130
has an SUV max of 7.9.

Right common iliac lymph node on image number 167 measures 7.5 mm
and has an SUV max of 6.35.

No pelvic or inguinal lymphadenopathy.

Incidental CT findings: none

SKELETON: No focal hypermetabolic activity to suggest skeletal
metastasis.

Incidental CT findings: Calcifications around both shoulders are
noted suggesting calcific tendinitis or bursitis.
IMPRESSION: Fairly extensive metastatic disease of uncertain origin. No obvious
primary lesion is identified. There is right-sided neck,
supraclavicular and axillary adenopathy along with extensive
mediastinal and hilar adenopathy and pulmonary metastatic disease.
There is also metastatic adenopathy in the abdomen and suspected
splenic metastasis. Melanoma would certainly be a consideration.
Biopsy of the right-sided neck nodes may be obtainable under
ultrasound guidance.

## 2019-06-03 DIAGNOSIS — H2511 Age-related nuclear cataract, right eye: Secondary | ICD-10-CM | POA: Diagnosis not present

## 2019-06-03 DIAGNOSIS — H2512 Age-related nuclear cataract, left eye: Secondary | ICD-10-CM | POA: Diagnosis not present

## 2019-06-03 DIAGNOSIS — H25042 Posterior subcapsular polar age-related cataract, left eye: Secondary | ICD-10-CM | POA: Diagnosis not present

## 2019-06-03 DIAGNOSIS — H25012 Cortical age-related cataract, left eye: Secondary | ICD-10-CM | POA: Diagnosis not present

## 2019-06-03 LAB — HM DIABETES EYE EXAM

## 2019-06-09 ENCOUNTER — Other Ambulatory Visit: Payer: Self-pay | Admitting: Internal Medicine

## 2019-06-10 DIAGNOSIS — H25012 Cortical age-related cataract, left eye: Secondary | ICD-10-CM | POA: Diagnosis not present

## 2019-06-10 DIAGNOSIS — H25812 Combined forms of age-related cataract, left eye: Secondary | ICD-10-CM | POA: Diagnosis not present

## 2019-06-10 DIAGNOSIS — H2512 Age-related nuclear cataract, left eye: Secondary | ICD-10-CM | POA: Diagnosis not present

## 2019-06-10 LAB — HM DIABETES EYE EXAM

## 2019-06-12 DIAGNOSIS — Z23 Encounter for immunization: Secondary | ICD-10-CM | POA: Diagnosis not present

## 2019-07-09 DIAGNOSIS — Z23 Encounter for immunization: Secondary | ICD-10-CM | POA: Diagnosis not present

## 2019-07-29 ENCOUNTER — Other Ambulatory Visit: Payer: Self-pay | Admitting: Internal Medicine

## 2019-08-07 ENCOUNTER — Encounter: Payer: Self-pay | Admitting: Internal Medicine

## 2019-08-07 DIAGNOSIS — E782 Mixed hyperlipidemia: Secondary | ICD-10-CM | POA: Diagnosis not present

## 2019-08-07 DIAGNOSIS — Z1159 Encounter for screening for other viral diseases: Secondary | ICD-10-CM | POA: Diagnosis not present

## 2019-08-07 DIAGNOSIS — E119 Type 2 diabetes mellitus without complications: Secondary | ICD-10-CM | POA: Diagnosis not present

## 2019-08-07 DIAGNOSIS — D649 Anemia, unspecified: Secondary | ICD-10-CM | POA: Diagnosis not present

## 2019-08-07 LAB — BASIC METABOLIC PANEL
BUN: 18 (ref 4–21)
CO2: 25 — AB (ref 13–22)
Chloride: 100 (ref 99–108)
Creatinine: 1 (ref 0.6–1.3)
Glucose: 165
Potassium: 4.6 (ref 3.4–5.3)
Sodium: 137 (ref 137–147)

## 2019-08-07 LAB — MICROALBUMIN, URINE: Microalb, Ur: 1.2

## 2019-08-07 LAB — COMPREHENSIVE METABOLIC PANEL
Calcium: 9.5 (ref 8.7–10.7)
GFR calc Af Amer: 82.51
GFR calc non Af Amer: 71.19

## 2019-08-07 LAB — HEMOGLOBIN A1C: Hemoglobin A1C: 7.4

## 2019-08-07 LAB — HM HEPATITIS C SCREENING LAB: HM Hepatitis Screen: NEGATIVE

## 2019-08-13 ENCOUNTER — Encounter: Payer: Self-pay | Admitting: Internal Medicine

## 2019-08-13 ENCOUNTER — Non-Acute Institutional Stay: Payer: Medicare Other | Admitting: Internal Medicine

## 2019-08-13 ENCOUNTER — Other Ambulatory Visit: Payer: Self-pay

## 2019-08-13 VITALS — BP 130/72 | HR 67 | Temp 97.5°F | Ht 71.0 in | Wt 204.0 lb

## 2019-08-13 DIAGNOSIS — N521 Erectile dysfunction due to diseases classified elsewhere: Secondary | ICD-10-CM | POA: Diagnosis not present

## 2019-08-13 DIAGNOSIS — Z7289 Other problems related to lifestyle: Secondary | ICD-10-CM | POA: Diagnosis not present

## 2019-08-13 DIAGNOSIS — E782 Mixed hyperlipidemia: Secondary | ICD-10-CM

## 2019-08-13 DIAGNOSIS — E119 Type 2 diabetes mellitus without complications: Secondary | ICD-10-CM

## 2019-08-13 DIAGNOSIS — D869 Sarcoidosis, unspecified: Secondary | ICD-10-CM | POA: Diagnosis not present

## 2019-08-13 DIAGNOSIS — M722 Plantar fascial fibromatosis: Secondary | ICD-10-CM

## 2019-08-13 DIAGNOSIS — Z789 Other specified health status: Secondary | ICD-10-CM

## 2019-08-13 MED ORDER — SILDENAFIL CITRATE 25 MG PO TABS: 25.0000 mg | ORAL_TABLET | Freq: Every day | ORAL | 0 refills | Status: DC | PRN

## 2019-08-13 MED ORDER — METFORMIN HCL ER 500 MG PO TB24: 1000.0000 mg | ORAL_TABLET | Freq: Every day | ORAL | 0 refills | Status: DC
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
## 2019-09-24 ENCOUNTER — Other Ambulatory Visit: Payer: Self-pay | Admitting: Internal Medicine

## 2019-09-24 DIAGNOSIS — N521 Erectile dysfunction due to diseases classified elsewhere: Secondary | ICD-10-CM

## 2019-10-07 ENCOUNTER — Other Ambulatory Visit: Payer: Self-pay | Admitting: Internal Medicine
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
## 2019-10-20 ENCOUNTER — Encounter: Payer: Self-pay | Admitting: Pulmonary Disease

## 2019-10-20 ENCOUNTER — Ambulatory Visit (INDEPENDENT_AMBULATORY_CARE_PROVIDER_SITE_OTHER): Payer: Medicare Other | Admitting: Pulmonary Disease

## 2019-10-20 ENCOUNTER — Other Ambulatory Visit: Payer: Self-pay

## 2019-10-20 ENCOUNTER — Other Ambulatory Visit: Payer: Self-pay | Admitting: Internal Medicine

## 2019-10-20 VITALS — BP 110/64 | HR 73 | Temp 98.2°F | Ht 71.0 in | Wt 202.0 lb

## 2019-10-20 DIAGNOSIS — G4730 Sleep apnea, unspecified: Secondary | ICD-10-CM | POA: Diagnosis not present

## 2019-10-20 DIAGNOSIS — J309 Allergic rhinitis, unspecified: Secondary | ICD-10-CM | POA: Diagnosis not present

## 2019-10-20 DIAGNOSIS — D869 Sarcoidosis, unspecified: Secondary | ICD-10-CM

## 2019-10-20 DIAGNOSIS — N521 Erectile dysfunction due to diseases classified elsewhere: Secondary | ICD-10-CM

## 2019-10-20 NOTE — Assessment & Plan Note (Signed)
Plan: Continue CPAP therapy 
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
## 2019-10-20 NOTE — Patient Instructions (Addendum)
You were seen today by Lauraine Rinne, NP  for:   Pleasure seeing you in office.  We will see you back in 1 year with Dr. [name] or sooner if your symptoms worsen.  I hope you get to enjoy your cruise!  Take care of yourself and stay safe,  Robert Norman  1. Sarcoidosis  We will continue to clinically observe you for this.  Lung sounds were stable on exam today.  I believe most your symptoms are directly related to allergic rhinitis as discussed.  2. Sleep apnea, unspecified type  We recommend that you continue using your CPAP daily >>>Keep up the hard work using your device >>> Goal should be wearing this for the entire night that you are sleeping, at least 4 to 6 hours  Remember:  . Do not drive or operate heavy machinery if tired or drowsy.  . Please notify the supply company and office if you are unable to use your device regularly due to missing supplies or machine being broken.  . Work on maintaining a healthy weight and following your recommended nutrition plan  . Maintain proper daily exercise and movement  . Maintaining proper use of your device can also help improve management of other chronic illnesses such as: Blood pressure, blood sugars, and weight management.   BiPAP/ CPAP Cleaning:  >>>Clean weekly, with Dawn soap, and bottle brush.  Set up to air dry. >>> Wipe mask out daily with wet wipe or towelette   3. Allergic rhinitis, unspecified seasonality, unspecified trigger  I recommend that you continue your daily antihistamine  If you start having increased nasal drainage, allergy-like symptoms please start taking fluticasone nasal spray 1 spray each nostril daily as needed for the symptoms   Follow Up:    Return in about 1 year (around 10/19/2020), or if symptoms worsen or fail to improve, for Follow up with Dr. [name].   Please do your part to reduce the spread of COVID-19:      Reduce your risk of any infection  and COVID19 by using the similar precautions used  for avoiding the common cold or flu:  Marland Kitchen Wash your hands often with soap and warm water for at least 20 seconds.  If soap and water are not readily available, use an alcohol-based hand sanitizer with at least 60% alcohol.  . If coughing or sneezing, cover your mouth and nose by coughing or sneezing into the elbow areas of your shirt or coat, into a tissue or into your sleeve (not your hands). [place] Gauss A MASK when in public  . Avoid shaking hands with others and consider head nods or verbal greetings only. . Avoid touching your eyes, nose, or mouth with unwashed hands.  . Avoid close contact with people who are sick. . Avoid places or events with large numbers of people in one location, like concerts or sporting events. . If you have some symptoms but not all symptoms, continue to monitor at home and seek medical attention if your symptoms worsen. . If you are having a medical emergency, call 911.   [place] / e-Visit: [email or web address]         [facility]: [phone]  [facility]: [id number]                   [facility]: [id number]     It is flu season:   >>> Best ways to protect herself  from the flu: Receive the yearly flu vaccine, practice good hand hygiene washing with soap and also using hand sanitizer when available, eat a nutritious meals, get adequate rest, hydrate appropriately   Please contact the office if your symptoms worsen or you have concerns that you are not improving.   Thank you for choosing [facility] for your healthcare, and for allowing [place] to partner with you on your healthcare journey. I am thankful to be able to provide care to you today.   Wyn Quaker FNP-C

## 2019-10-20 NOTE — Assessment & Plan Note (Signed)
Plan: Continue to clinically monitor 6 to 56-month follow-up with Dr. [name]

## 2019-11-14 ENCOUNTER — Other Ambulatory Visit: Payer: Self-pay | Admitting: *Deleted

## 2019-11-14 NOTE — Telephone Encounter (Signed)
Patient notified and agreed. Medication list updated.  

## 2019-11-14 NOTE — Telephone Encounter (Signed)
Received refill request from [facility] for Vitamin D Pended Rx and sent to Dr. Mariea Clonts for approval.

## 2019-11-14 NOTE — Telephone Encounter (Signed)
I recommend that Robert Norman now move to taking vitamin D3 2000 units by mouth daily rather than continuing on high dose vitamin D ongoing.

## 2019-11-17 ENCOUNTER — Other Ambulatory Visit: Payer: Self-pay | Admitting: Internal Medicine

## 2019-12-11 ENCOUNTER — Other Ambulatory Visit: Payer: Self-pay | Admitting: Internal Medicine

## 2019-12-11 DIAGNOSIS — E119 Type 2 diabetes mellitus without complications: Secondary | ICD-10-CM | POA: Diagnosis not present

## 2019-12-11 DIAGNOSIS — E785 Hyperlipidemia, unspecified: Secondary | ICD-10-CM | POA: Diagnosis not present

## 2019-12-11 DIAGNOSIS — N521 Erectile dysfunction due to diseases classified elsewhere: Secondary | ICD-10-CM

## 2019-12-11 DIAGNOSIS — D649 Anemia, unspecified: Secondary | ICD-10-CM | POA: Diagnosis not present

## 2019-12-11 DIAGNOSIS — K219 Gastro-esophageal reflux disease without esophagitis: Secondary | ICD-10-CM | POA: Diagnosis not present

## 2019-12-11 DIAGNOSIS — E782 Mixed hyperlipidemia: Secondary | ICD-10-CM | POA: Diagnosis not present

## 2019-12-11 LAB — BASIC METABOLIC PANEL
BUN: 15 (ref 4–21)
CO2: 24 — AB (ref 13–22)
Chloride: 102 (ref 99–108)
Creatinine: 0.9 (ref 0.6–1.3)
Creatinine: 0.9 (ref 0.6–1.3)
Glucose: 152
Glucose: 152
Potassium: 5 (ref 3.4–5.3)
Sodium: 138 (ref 137–147)

## 2019-12-11 LAB — LIPID PANEL
Cholesterol: 113 (ref 0–200)
HDL: 41 (ref 35–70)
LDL Cholesterol: 48
Triglycerides: 120 (ref 40–160)

## 2019-12-11 LAB — HEMOGLOBIN A1C: Hemoglobin A1C: 7.8

## 2019-12-11 LAB — COMPREHENSIVE METABOLIC PANEL
Calcium: 9.5 (ref 8.7–10.7)
Calcium: 9.5 (ref 8.7–10.7)

## 2019-12-11 NOTE — Telephone Encounter (Signed)
rx sent to pharmacy by e-script  

## 2019-12-17 ENCOUNTER — Other Ambulatory Visit: Payer: Self-pay

## 2019-12-17 ENCOUNTER — Non-Acute Institutional Stay: Payer: Medicare Other | Admitting: Internal Medicine

## 2019-12-17 ENCOUNTER — Other Ambulatory Visit: Payer: Self-pay | Admitting: Internal Medicine

## 2019-12-17 ENCOUNTER — Encounter: Payer: Self-pay | Admitting: Internal Medicine

## 2019-12-17 ENCOUNTER — Telehealth: Payer: Self-pay

## 2019-12-17 VITALS — BP 126/82 | HR 72 | Temp 97.5°F | Ht 71.0 in | Wt 206.0 lb

## 2019-12-17 DIAGNOSIS — E782 Mixed hyperlipidemia: Secondary | ICD-10-CM

## 2019-12-17 DIAGNOSIS — N521 Erectile dysfunction due to diseases classified elsewhere: Secondary | ICD-10-CM | POA: Diagnosis not present

## 2019-12-17 DIAGNOSIS — F1090 Alcohol use, unspecified, uncomplicated: Secondary | ICD-10-CM

## 2019-12-17 DIAGNOSIS — D869 Sarcoidosis, unspecified: Secondary | ICD-10-CM | POA: Diagnosis not present

## 2019-12-17 DIAGNOSIS — Z7289 Other problems related to lifestyle: Secondary | ICD-10-CM

## 2019-12-17 DIAGNOSIS — E119 Type 2 diabetes mellitus without complications: Secondary | ICD-10-CM

## 2019-12-17 DIAGNOSIS — Z789 Other specified health status: Secondary | ICD-10-CM

## 2019-12-17 MED ORDER — METFORMIN HCL ER 500 MG PO TB24: 1000.0000 mg | ORAL_TABLET | Freq: Two times a day (BID) | ORAL | 3 refills | Status: DC

## 2019-12-17 NOTE — Telephone Encounter (Signed)
Lab results cholesterol is a @ goal and A1C 7.8 fair, but think he can get this down.... Per Dr Mariea Clonts.

## 2019-12-17 NOTE — Progress Notes (Signed)
Location:  Occupational psychologist of Service:  Clinic (12)  Provider: Omarius Grantham L. Mariea Clonts, D.O., C.M.D.  Goals of Care:  Advanced Directives 08/13/2019  Does Patient Have a Medical Advance Directive? Yes  Type of Paramedic of [place];Living will  Does patient want to make changes to medical advance directive? No - Patient declined  Copy of [place] in Chart? Yes - validated most recent copy scanned in chart (See row information)  Would patient like information on creating a medical advance directive? -   Chief Complaint  Patient presents with  . Medical Management of Chronic Issues    4 month follow up    HPI: Patient is a 74 y.o. male seen today for medical management of chronic diseases.    hba1c was 7.8 up from 7.4.  He's struggling to skip the desserts and sweet items.  He's walking twice a day (45 mins each time) and he's also gained weight.  He does not crave desserts if he's done his walk in the evening.  He tries not to have any regular potatoes only sweet potatoes.  He is having a couple of glasses of wine each day and looks forward to that at the end of the day.  Taking metformin 1000mg  with breakfast.  He's willing to go back to twice a day with meals.  He's got a lot of family history.  Discussed that jardiance would be next step.  Taking a walk instead of eating a sweet item.    He's been taking fish oil.    He was unable to find anything about his jaundice.  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    Breathing:  Has chronic cough.  Sees Wyn Quaker at [place].  He has had severe OSA, sarcoid.  He's on different antihistamine.  Not coughing like he did.  Has postnasal drip.  Doing a variation of hot tea/lemon/honey.    Bowels moving ok with the miralax.    Cholesterol is at goal with his walking, fewer desserts.    He's having  difficulty with erectile dysfunction.  Has tried taking 2 instead of 1 25mg  viagra.  Discussed he could take as many as 4.  Previously was on cialis 20mg .  His girlfriend is on hormone replacements.    BP is good.    He had cataract surgery. He got progressive lenses.  At [facility] optho with Dr. Derrel Nip (?) Past Medical History:  Diagnosis Date  . Asthma   . Calcific Achilles tendonitis    bilateral  . Controlled type 2 diabetes mellitus without complication ([place])   . DDD (degenerative disc disease), cervical   . Gallstone 09/11/2011  . GERD (gastroesophageal reflux disease)   . Hyperlipidemia    mixed  . Insomnia   . Mild cognitive impairment   . Overweight (BMI 25.0-29.9)   . Prostate cancer ([place])   . Pulmonary nodule/lesion, solitary 06/2004   f/u in 2014, 2015 stable in [facility]  . Sleep apnea   . Slow transit constipation   . Vitamin D deficiency     Past Surgical History:  Procedure Laterality Date  . CERVICAL FUSION  1999 & 2004   implant metal plate  . CHOLECYSTECTOMY  2012  . COLONOSCOPY  12/2015  . EYE SURGERY  1997   laser vision correction  . palatouvulopasty  1995  . PROSTATE  SURGERY  09/11/2011   prostatectomy for prostate cancer   . TONSILLECTOMY    . WISDOM TOOTH EXTRACTION      Allergies  Allergen Reactions  . Tetracyclines & Related Other (See Comments)    hepatitis  . Niacin And Related Other (See Comments)    headache  . Wellbutrin [Bupropion] Other (See Comments)    dizziness    Outpatient Encounter Medications as of 12/17/2019  Medication Sig  . Coenzyme Q10 (CO Q 10) 100 MG CAPS Take 2 capsules by mouth daily.  Marland Kitchen glucose blood (FREESTYLE LITE) test strip Use to test blood sugar Daily. Dx: E11.9  . Lancets (FREESTYLE) lancets Use to test blood sugar daily. Dx E11.9  . loratadine (CLARITIN) 10 MG tablet Take 10 mg by mouth daily.  . metFORMIN (GLUCOPHAGE-XR) 500 MG 24 hr tablet Take 2 tablets (1,000 mg total) by mouth daily with  breakfast.  . polyethylene glycol (MIRALAX / GLYCOLAX) packet Take 17 g by mouth daily.  . sildenafil (VIAGRA) 25 MG tablet TAKE ONE TABLET BY MOUTH DAILY AS NEEDED FOR ERECTILE DYSFUNCTION  NOT A DAILY MEDICATION  . simvastatin (ZOCOR) 40 MG tablet TAKE 1 TABLET ONCE DAILY IN THE EVENING.  . Turmeric (QC TUMERIC COMPLEX) 500 MG CAPS Take 500 mg by mouth 2 (two) times daily.  . Vitamin D, Ergocalciferol, (DRISDOL) 1.25 MG (50000 UNIT) CAPS capsule TAKE ONE CAPSULE BY MOUTH ONCE WEEKLY  . [DISCONTINUED] Cholecalciferol (VITAMIN D3) 50 MCG (2000 UT) TABS Take one tablet by mouth once daily   No facility-administered encounter medications on file as of 12/17/2019.    Review of Systems:  Review of Systems  Constitutional: Negative for chills and fever.  HENT: Positive for hearing loss. Negative for sore throat.        Postnasal drip  Eyes: Negative for blurred vision.  Respiratory: Positive for cough. Negative for shortness of breath and wheezing.   Cardiovascular: Negative for chest pain, palpitations and leg swelling.  Gastrointestinal: Negative for abdominal pain, blood in stool, constipation, diarrhea and melena.  Genitourinary: Negative for dysuria.  Musculoskeletal: Negative for falls and joint pain.  Skin: Negative for itching and rash.  Neurological: Negative for dizziness and loss of consciousness.  Endo/Heme/Allergies: Does not bruise/bleed easily.       ED  Psychiatric/Behavioral: Negative for depression and memory loss. The patient is not nervous/anxious and does not have insomnia.     Health Maintenance  Topic Date Due  . OPHTHALMOLOGY EXAM  07/10/2019  . INFLUENZA VACCINE  12/28/2019  . HEMOGLOBIN A1C  02/07/2020  . FOOT EXAM  02/12/2020  . URINE MICROALBUMIN  08/06/2020  . TETANUS/TDAP  05/07/2027  . COLONOSCOPY  12/16/2028  . COVID-19 Vaccine  Completed  . Hepatitis C Screening  Completed  . PNA vac Low Risk Adult  Completed    Physical Exam: Vitals:   12/17/19  0932  BP: 126/82  Pulse: 72  Temp: (!) 97.5 F (36.4 C)  TempSrc: Temporal  SpO2: 95%  Weight: 206 lb (93.4 kg)  Height: 5\' 11"  (1.803 m)   Body mass index is 28.73 kg/m. Physical Exam Vitals reviewed.  Constitutional:      Appearance: Normal appearance.  HENT:     Head: Normocephalic and atraumatic.     Right Ear: Tympanic membrane, ear canal and external ear normal.     Left Ear: Tympanic membrane, ear canal and external ear normal.     Ears:     Comments: Hearing aids Cardiovascular:  Rate and Rhythm: Normal rate and regular rhythm.  Pulmonary:     Effort: Pulmonary effort is normal.     Breath sounds: Rhonchi present.  Abdominal:     General: Bowel sounds are normal.  Musculoskeletal:        General: Normal range of motion.     Right lower leg: No edema.     Left lower leg: No edema.  Skin:    Comments: Very tiny cyst on midback 1/2 cm  Neurological:     General: No focal deficit present.     Mental Status: He is alert and oriented to person, place, and time.  Psychiatric:        Mood and Affect: Mood normal.        Behavior: Behavior normal.        Thought Content: Thought content normal.        Judgment: Judgment normal.     Labs reviewed: Basic Metabolic Panel: Recent Labs    02/04/19 0000 08/07/19 0800 12/11/19 0000  NA 139 137  --   K 5.1 4.6  --   CL  --  100  --   CO2  --  25*  --   BUN 15 18  --   CREATININE 1.0 1.0 0.9  CALCIUM  --  9.5 9.5   Liver Function Tests: Recent Labs    02/04/19 0500  AST 15  ALT 17  ALKPHOS 55   No results for input(s): LIPASE, AMYLASE in the last 8760 hours. No results for input(s): AMMONIA in the last 8760 hours. CBC: Recent Labs    02/04/19 0500  WBC 4.3  HGB 14.7  HCT 43  PLT 172   Lipid Panel: No results for input(s): CHOL, HDL, LDLCALC, TRIG, CHOLHDL, LDLDIRECT in the last 8760 hours. Lab Results  Component Value Date   HGBA1C 7.4 08/07/2019    Assessment/Plan 1. Controlled type 2  diabetes mellitus without complication, without long-term current use of insulin ([place]) - control had worsened despite his exercise efforts and decreased dessert -increase metformin, decrease wine - metFORMIN (GLUCOPHAGE-XR) 500 MG 24 hr tablet; Take 2 tablets (1,000 mg total) by mouth 2 (two) times daily with a meal.  Dispense: 360 tablet; Refill: 3  2. Sarcoidosis -stable, followed by pulmonary  3. Alcohol use -two glasses of wine per day, more than last time but less than in past -recommended some reduction due to increased hba1c  4. Mixed hyperlipidemia -at goal with statin and fish oil his son gets him  5. Erectile dysfunction due to diseases classified elsewhere -try taking 4 viagra 25mg  and see if that's effective, if not, call back   Labs/tests ordered:  Bmp, hba1c before Next appt: 4 mos    [place] L. Burrel Legrand, D.O. [facility] [address], [place] [address] Cell Phone (Mon-Fri 8am-5pm):  [phone] On Call:  [phone] & follow prompts after 5pm & weekends Office Phone:  [phone] Office Fax:  [phone]

## 2020-02-04 ENCOUNTER — Telehealth: Payer: Self-pay

## 2020-02-04 ENCOUNTER — Other Ambulatory Visit: Payer: Self-pay | Admitting: Internal Medicine

## 2020-02-04 DIAGNOSIS — N521 Erectile dysfunction due to diseases classified elsewhere: Secondary | ICD-10-CM

## 2020-02-04 MED ORDER — SILDENAFIL CITRATE 50 MG PO TABS: 50.0000 mg | ORAL_TABLET | Freq: Every day | ORAL | 0 refills | Status: DC | PRN

## 2020-02-04 NOTE — Telephone Encounter (Signed)
Patient states that he's currently taking 25 mg of Viagra. But went to pharmacy and the pharmacist recommended that he increase Viagra to 50 mg since he's already taking medication more than one at a time. Medication should be sent to [facility] on [address]. Please Advise.

## 2020-02-04 NOTE — Telephone Encounter (Signed)
viagra dose increased as requested and sent to [facility].

## 2020-03-04 DIAGNOSIS — Z23 Encounter for immunization: Secondary | ICD-10-CM | POA: Diagnosis not present

## 2020-04-02 ENCOUNTER — Other Ambulatory Visit: Payer: Self-pay

## 2020-04-02 ENCOUNTER — Ambulatory Visit (INDEPENDENT_AMBULATORY_CARE_PROVIDER_SITE_OTHER): Payer: Medicare Other | Admitting: Family

## 2020-04-02 ENCOUNTER — Encounter: Payer: Self-pay | Admitting: Family

## 2020-04-02 DIAGNOSIS — Z0000 Encounter for general adult medical examination without abnormal findings: Secondary | ICD-10-CM | POA: Diagnosis not present

## 2020-04-02 NOTE — Progress Notes (Signed)
    This service is provided via telemedicine  No vital signs collected/recorded due to the encounter was a telemedicine visit.   Location of patient (ex: home, work): Home.   Patient consents to a telephone visit: Yes.  Location of the provider (ex: office, home):  [facility].  Name of any referring provider: Reed, Tiffany L, DO   Names of all persons participating in the telemedicine service and their role in the encounter: Patient, Robert Norman, RMA, Ngetich, Dinah, NP.    Time spent on call: 8 minutes spent on the phone with Medical Assistant.   

## 2020-04-02 NOTE — Progress Notes (Signed)
Subjective:   Robert Norman is a 74 y.o. male who presents for Medicare Annual/Subsequent preventive examination.  Review of Systems     Cardiac Risk Factors include: advanced age (>58men, >58 women);diabetes mellitus;dyslipidemia;male gender     Objective:    There were no vitals filed for this visit. There is no height or weight on file to calculate BMI.  Advanced Directives 04/02/2020 08/13/2019 04/02/2019 02/12/2019 10/09/2018 12/05/2017 12/04/2017  Does Patient Have a Medical Advance Directive? Yes Yes Yes Yes Yes Yes Yes  Type of Paramedic of [place];Living will Healthcare Power of [place] of [place] of [place] of [place];Living will  Does patient want to make changes to medical advance directive? No - Patient declined No - Patient declined No - Patient declined No - Patient declined No - Patient declined No - Patient declined No - Patient declined  Copy of [place] in Chart? Yes - validated most recent copy scanned in chart (See row information) Yes - validated most recent copy scanned in chart (See row information) Yes - validated most recent copy scanned in chart (See row information) Yes - validated most recent copy scanned in chart (See row information) Yes - validated most recent copy scanned in chart (See row information) Yes Yes  Would patient like information on creating a medical advance directive? - - - - No - Patient declined - -    Current Medications (verified) Outpatient Encounter Medications as of 04/02/2020  Medication Sig  . Coenzyme Q10 (CO Q 10) 100 MG CAPS Take 2 capsules by mouth daily.  Marland Kitchen loratadine (CLARITIN) 10 MG tablet Take 10 mg by mouth daily.  . metFORMIN (GLUCOPHAGE-XR) 500 MG 24 hr tablet Take 2 tablets (1,000 mg total) by mouth 2 (two) times daily with a meal.  . polyethylene glycol (MIRALAX /  GLYCOLAX) packet Take 17 g by mouth daily.  . sildenafil (VIAGRA) 50 MG tablet Take 50 mg by mouth as needed for erectile dysfunction.  . simvastatin (ZOCOR) 40 MG tablet TAKE 1 TABLET ONCE DAILY IN THE EVENING.  . Turmeric (QC TUMERIC COMPLEX) 500 MG CAPS Take 500 mg by mouth 2 (two) times daily.  . Vitamin D, Ergocalciferol, (DRISDOL) 1.25 MG (50000 UNIT) CAPS capsule TAKE 1 CAPSULE ONCE A WEEK.  . [DISCONTINUED] glucose blood (FREESTYLE LITE) test strip Use to test blood sugar Daily. Dx: E11.9  . [DISCONTINUED] Lancets (FREESTYLE) lancets Use to test blood sugar daily. Dx E11.9  . [DISCONTINUED] sildenafil (VIAGRA) 50 MG tablet Take 1 tablet (50 mg total) by mouth daily as needed for erectile dysfunction.   No facility-administered encounter medications on file as of 04/02/2020.    Allergies (verified) Tetracyclines & related, Niacin and related, and Wellbutrin [bupropion]   History: Past Medical History:  Diagnosis Date  . Asthma   . Calcific Achilles tendonitis    bilateral  . Controlled type 2 diabetes mellitus without complication ([place])   . DDD (degenerative disc disease), cervical   . Gallstone 09/11/2011  . GERD (gastroesophageal reflux disease)   . Hyperlipidemia    mixed  . Insomnia   . Mild cognitive impairment   . Overweight (BMI 25.0-29.9)   . Prostate cancer ([place])   . Pulmonary nodule/lesion, solitary 06/2004   f/u in 2014, 2015 stable in [facility]  . Sleep apnea   . Slow transit constipation   . Vitamin D deficiency  Past Surgical History:  Procedure Laterality Date  . CERVICAL FUSION  1999 & 2004   implant metal plate  . CHOLECYSTECTOMY  2012  . COLONOSCOPY  12/2015  . EYE SURGERY  1997   laser vision correction  . palatouvulopasty  1995  . PROSTATE SURGERY  09/11/2011   prostatectomy for prostate cancer   . TONSILLECTOMY    . WISDOM TOOTH EXTRACTION     Family History  Problem Relation Age of Onset  . Cancer Mother   . Heart disease Father   . Heart  disease Brother   . Diabetes Brother   . Colon cancer Neg Hx   . Rectal cancer Neg Hx   . Stomach cancer Neg Hx   . Esophageal cancer Neg Hx    Social History   Socioeconomic History  . Marital status: Widowed    Spouse name: Not on file  . Number of children: Not on file  . Years of education: Not on file  . Highest education level: Not on file  Occupational History  . Occupation: accounting    Comment: Agricultural consultant  Tobacco Use  . Smoking status: Never Smoker  . Smokeless tobacco: Never Used  Vaping Use  . Vaping Use: Never used  Substance and Sexual Activity  . Alcohol use: Not Currently  . Drug use: No  . Sexual activity: Yes  Other Topics Concern  . Not on file  Social History Narrative   Tobacco use, amount per day now: NEVER USED   Past tobacco use, amount per day: NEVER USED   How many years did you use tobacco: NONE   Alcohol use (drinks per week): 10 GLASSES OF WINE A WEEK   Diet:   Do you drink/eat things with caffeine: YES   Marital status:  WIDOWED                    What year were you married?   Do you live in a house, apartment, assisted living, condo, trailer, etc.? APARTMENT   Is it one or more stories? ONE STORY   How many persons live in your home? NONE   Do you have pets in your home?( please list) NO   Current or past profession: FINANCIAL   Do you exercise?  YES                                Type and how often? WALKING 1-2 HOURS   Do you have a living will? YES   Do you have a DNR form?    NO                               If not, do you want to discuss one?   Do you have signed POA/HPOA forms?   YES                     If so, please bring to you appointment   Social Determinants of Health   Financial Resource Strain:   . Difficulty of Paying Living Expenses: Not on file  Food Insecurity:   . Worried About Charity fundraiser in the Last Year: Not on file  . Ran Out of Food in the Last Year: Not on file  Transportation Needs:   .  Lack of Transportation (Medical): Not on file  . Lack  of Transportation (Non-Medical): Not on file  Physical Activity:   . Days of Exercise per Week: Not on file  . Minutes of Exercise per Session: Not on file  Stress:   . Feeling of Stress : Not on file  Social Connections:   . Frequency of Communication with Friends and Family: Not on file  . Frequency of Social Gatherings with Friends and Family: Not on file  . Attends Religious Services: Not on file  . Active Member of Clubs or Organizations: Not on file  . Attends Archivist Meetings: Not on file  . Marital Status: Not on file    Tobacco Counseling Counseling given: Not Answered   Clinical Intake:  Pre-visit preparation completed: No  Pain : No/denies pain     BMI - recorded: 28.74 Nutritional Status: BMI 25 -29 Overweight Nutritional Risks: None Diabetes: Yes CBG done?: No Did pt. bring in CBG monitor from home?: No (does not check blood sugar)  How often do you need to have someone help you when you read instructions, pamphlets, or other written materials from your doctor or pharmacy?: 1 - Never What is the last grade level you completed in school?: 18 Yrs  Diabetic?yes  Interpreter Needed?: No  Information entered by :: Erman Thum FNP-C   Activities of Daily Living In your present state of health, do you have any difficulty performing the following activities: 04/02/2020  Hearing? Y  Comment wears hearing aids  Vision? N  Difficulty concentrating or making decisions? Y  Comment rembering  Walking or climbing stairs? N  Dressing or bathing? N  Doing errands, shopping? N  Preparing Food and eating ? N  Using the Toilet? N  In the past six months, have you accidently leaked urine? N  Do you have problems with loss of bowel control? N  Managing your Medications? N  Managing your Finances? N  Housekeeping or managing your Housekeeping? 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     Assessment:   This is a routine wellness examination for [place].  Hearing/Vision screen  Hearing Screening   125Hz  250Hz  500Hz  1000Hz  2000Hz  3000Hz  4000Hz  6000Hz  8000Hz   Right ear:           Left ear:           Comments: No Hearing Concerns. Patient wears hearing aids.   Vision Screening Comments: No Vision Concerns. Patient wears reading glasses. Last Eye Exam was 07/28/2019  Dietary issues and exercise activities discussed: Current Exercise Habits: Home exercise routine, Type of exercise: walking, Time (Minutes): 45, Frequency (Times/Week): 5, Weekly Exercise (Minutes/Week): 225, Intensity: Mild, Exercise limited by: None identified  Goals    . Weight (lb) < 200 lb (90.7 kg)     I would like to loss weight down to 195 lbs     . Weight (lb) < 200 lb (90.7 kg)     I would like loss weight to stay < 200 lbs       Depression Screen PHQ 2/9 Scores 04/02/2020 08/13/2019 04/02/2019 10/09/2018 06/12/2018 12/05/2017 12/04/2017  PHQ - 2 Score 0 0 0 0 0 0 0    Fall Risk Fall Risk  04/02/2020 08/13/2019 04/02/2019  02/12/2019 10/09/2018  Falls in the past year? 0 0 0 0 0  Number falls in past yr: 0 0 0 0 0  Injury with Fall? 0 0 0 0 0    Any stairs in or around the home? No  If so, are there any without handrails? No  Home free of loose throw rugs in walkways, pet beds, electrical cords, etc? No  Adequate lighting in your home to reduce risk of falls? Yes   ASSISTIVE DEVICES UTILIZED TO PREVENT FALLS:  Life alert? No  Use of a cane, walker or w/c? No  Grab bars in the bathroom? Yes  Shower chair or bench in shower? No  Elevated toilet seat or a handicapped toilet? Yes   TIMED UP AND GO:  Was the test  performed? No .  Length of time to ambulate 10 feet: N/A  sec.   Gait steady and fast without use of assistive device  Cognitive Function: MMSE - Mini Mental State Exam 12/04/2017  Orientation to time 4  Orientation to Place 5  Registration 3  Attention/ Calculation 5  Recall 2  Language- name 2 objects 2  Language- repeat 1  Language- follow 3 step command 3  Language- read & follow direction 1  Write a sentence 1  Copy design 1  Total score 28     6CIT Screen 04/02/2020 04/02/2019  What Year? 0 points 0 points  What month? 0 points 0 points  What time? 0 points 0 points  Count back from 20 4 points 0 points  Months in reverse 0 points 0 points  Repeat phrase 0 points 4 points  Total Score 4 4    Immunizations Immunization History  Administered Date(s) Administered  . Influenza, High Dose Seasonal PF 03/04/2018, 03/07/2019, 03/04/2020  . Influenza-Unspecified 03/12/2014, 03/02/2016, 02/26/2017  . PFIZER SARS-COV-2 Vaccination 05/31/2019, 06/18/2019  . Pneumococcal Conjugate-13 08/04/2016  . Pneumococcal Polysaccharide-23 04/05/2010, 04/17/2019  . Td 05/06/2017  . Tdap 03/12/2014  . Zoster 07/19/2007  . Zoster Recombinat (Shingrix) 02/26/2017, 03/29/2017    TDAP status: Up to date Flu Vaccine status: Up to date Pneumococcal vaccine status: Up to date Covid-19 vaccine status: Completed vaccines  Qualifies for Shingles Vaccine? Yes   Zostavax completed Yes   Shingrix Completed?: Yes  Screening Tests Health Maintenance  Topic Date Due  . OPHTHALMOLOGY EXAM  07/10/2019  . FOOT EXAM  02/12/2020  . HEMOGLOBIN A1C  06/12/2020  . URINE MICROALBUMIN  08/06/2020  . TETANUS/TDAP  05/07/2027  . COLONOSCOPY  12/16/2028  . INFLUENZA VACCINE  Completed  . COVID-19 Vaccine  Completed  . Hepatitis C Screening  Completed  . PNA vac Low Risk Adult  Completed    Health Maintenance  Health Maintenance Due  Topic Date Due  . OPHTHALMOLOGY EXAM  07/10/2019  . FOOT EXAM   02/12/2020    Colorectal cancer screening: Completed 12/17/2018. Repeat every N/A  years  Lung Cancer Screening: (Low Dose CT Chest recommended if Age 74-80 years, 30 pack-year currently smoking OR have quit w/in 15years.) does not qualify.   Lung Cancer Screening Referral: NO  Additional Screening:  Hepatitis C Screening: does not qualify; Completed YES  Vision Screening: Recommended annual ophthalmology exams for early detection of glaucoma and other disorders of the eye. Is the patient up to date with their annual eye exam?  Yes  Who is the provider or what is the name of the office in which the patient attends annual eye exams? Will call with provider  name  If pt is not established with a provider, would they like to be referred to a provider to establish care? No .   Dental Screening: Recommended annual dental exams for proper oral hygiene  Community Resource Referral / Chronic Care Management: CRR required this visit?  No   CCM required this visit?  No     Plan:   - follows up with ophthalmology  - due for annual foot exam but states usually done by PCP Dr.Reed   I have personally reviewed and noted the following in the patient's chart:   . Medical and social history . Use of alcohol, tobacco or illicit drugs  . Current medications and supplements . Functional ability and status . Nutritional status . Physical activity . Advanced directives . List of other physicians . Hospitalizations, surgeries, and ER visits in previous 12 months . Vitals . Screenings to include cognitive, depression, and falls . Referrals and appointments  In addition, I have reviewed and discussed with patient certain preventive protocols, quality metrics, and best practice recommendations. A written personalized care plan for preventive services as well as general preventive health recommendations were provided to patient.     Sandrea Hughs, NP   04/02/2020   Nurse Notes:None

## 2020-04-02 NOTE — Patient Instructions (Signed)
Robert Norman , Thank you for taking time to come for your Medicare Wellness Visit. I appreciate your ongoing commitment to your health goals. Please review the following plan we discussed and let me know if I can assist you in the future.   Screening recommendations/referrals: Colonoscopy : N/A Recommended yearly ophthalmology/optometry visit for glaucoma screening and checkup Recommended yearly dental visit for hygiene and checkup  Vaccinations: Influenza vaccine : Up to date  Pneumococcal vaccine : Up to date  Tdap vaccine : Up to date  Shingles vaccine : Up to date     Advanced directives: Yes   Conditions/risks identified: Advance age > 28 yrs,Type 2 DM,dyslipidemia,male Gender   Next appointment: 1 year   Preventive Care 65 Years and Older, Male Preventive care refers to lifestyle choices and visits with your health care provider that can promote health and wellness. What does preventive care include?  A yearly physical exam. This is also called an annual well check.  Dental exams once or twice a year.  Routine eye exams. Ask your health care provider how often you should have your eyes checked.  Personal lifestyle choices, including:  Daily care of your teeth and gums.  Regular physical activity.  Eating a healthy diet.  Avoiding tobacco and drug use.  Limiting alcohol use.  Practicing safe sex.  Taking low doses of aspirin every day.  Taking vitamin and mineral supplements as recommended by your health care provider. What happens during an annual well check? The services and screenings done by your health care provider during your annual well check will depend on your age, overall health, lifestyle risk factors, and family history of disease. Counseling  Your health care provider may ask you questions about your:  Alcohol use.  Tobacco use.  Drug use.  Emotional well-being.  Home and relationship well-being.  Sexual activity.  Eating  habits.  History of falls.  Memory and ability to understand (cognition).  Work and work Statistician. Screening  You may have the following tests or measurements:  Height, weight, and BMI.  Blood pressure.  Lipid and cholesterol levels. These may be checked every 5 years, or more frequently if you are over 68 years old.  Skin check.  Lung cancer screening. You may have this screening every year starting at age 61 if you have a 30-pack-year history of smoking and currently smoke or have quit within the past 15 years.  Fecal occult blood test (FOBT) of the stool. You may have this test every year starting at age 36.  Flexible sigmoidoscopy or colonoscopy. You may have a sigmoidoscopy every 5 years or a colonoscopy every 10 years starting at age 12.  Prostate cancer screening. Recommendations will vary depending on your family history and other risks.  Hepatitis C blood test.  Hepatitis B blood test.  Sexually transmitted disease (STD) testing.  Diabetes screening. This is done by checking your blood sugar (glucose) after you have not eaten for a while (fasting). You may have this done every 1-3 years.  Abdominal aortic aneurysm (AAA) screening. You may need this if you are a current or former smoker.  Osteoporosis. You may be screened starting at age 13 if you are at high risk. Talk with your health care provider about your test results, treatment options, and if necessary, the need for more tests. Vaccines  Your health care provider may recommend certain vaccines, such as:  Influenza vaccine. This is recommended every year.  Tetanus, diphtheria, and acellular pertussis (Tdap, Td)  vaccine. You may need a Td booster every 10 years.  Zoster vaccine. You may need this after age 15.  Pneumococcal 13-valent conjugate (PCV13) vaccine. One dose is recommended after age 6.  Pneumococcal polysaccharide (PPSV23) vaccine. One dose is recommended after age 62. Talk to your health  care provider about which screenings and vaccines you need and how often you need them. This information is not intended to replace advice given to you by your health care provider. Make sure you discuss any questions you have with your health care provider. Document Released: 06/11/2015 Document Revised: 02/02/2016 Document Reviewed: 03/16/2015 Elsevier Interactive Patient Education  2017 [place] Prevention in the Home Falls can cause injuries. They can happen to people of all ages. There are many things you can do to make your home safe and to help prevent falls. What can I do on the outside of my home?  Regularly fix the edges of walkways and driveways and fix any cracks.  Remove anything that might make you trip as you walk through a door, such as a raised step or threshold.  Trim any bushes or trees on the path to your home.  Use bright outdoor lighting.  Clear any walking paths of anything that might make someone trip, such as rocks or tools.  Regularly check to see if handrails are loose or broken. Make sure that both sides of any steps have handrails.  Any raised decks and porches should have guardrails on the edges.  Have any leaves, snow, or ice cleared regularly.  Use sand or salt on walking paths during winter.  Clean up any spills in your garage right away. This includes oil or grease spills. What can I do in the bathroom?  Use night lights.  Install grab bars by the toilet and in the tub and shower. Do not use towel bars as grab bars.  Use non-skid mats or decals in the tub or shower.  If you need to sit down in the shower, use a plastic, non-slip stool.  Keep the floor dry. Clean up any water that spills on the floor as soon as it happens.  Remove soap buildup in the tub or shower regularly.  Attach bath mats securely with double-sided non-slip rug tape.  Do not have throw rugs and other things on the floor that can make you trip. What can I do  in the bedroom?  Use night lights.  Make sure that you have a light by your bed that is easy to reach.  Do not use any sheets or blankets that are too big for your bed. They should not hang down onto the floor.  Have a firm chair that has side arms. You can use this for support while you get dressed.  Do not have throw rugs and other things on the floor that can make you trip. What can I do in the kitchen?  Clean up any spills right away.  Avoid walking on wet floors.  Keep items that you use a lot in easy-to-reach places.  If you need to reach something above you, use a strong step stool that has a grab bar.  Keep electrical cords out of the way.  Do not use floor polish or wax that makes floors slippery. If you must use wax, use non-skid floor wax.  Do not have throw rugs and other things on the floor that can make you trip. What can I do with my stairs?  Do not leave  any items on the stairs.  Make sure that there are handrails on both sides of the stairs and use them. Fix handrails that are broken or loose. Make sure that handrails are as long as the stairways.  Check any carpeting to make sure that it is firmly attached to the stairs. Fix any carpet that is loose or worn.  Avoid having throw rugs at the top or bottom of the stairs. If you do have throw rugs, attach them to the floor with carpet tape.  Make sure that you have a light switch at the top of the stairs and the bottom of the stairs. If you do not have them, ask someone to add them for you. What else can I do to help prevent falls?  Wear shoes that:  Do not have high heels.  Have rubber bottoms.  Are comfortable and fit you well.  Are closed at the toe. Do not wear sandals.  If you use a stepladder:  Make sure that it is fully opened. Do not climb a closed stepladder.  Make sure that both sides of the stepladder are locked into place.  Ask someone to hold it for you, if possible.  Clearly mark  and make sure that you can see:  Any grab bars or handrails.  First and last steps.  Where the edge of each step is.  Use tools that help you move around (mobility aids) if they are needed. These include:  Canes.  Walkers.  Scooters.  Crutches.  Turn on the lights when you go into a dark area. Replace any light bulbs as soon as they burn out.  Set up your furniture so you have a clear path. Avoid moving your furniture around.  If any of your floors are uneven, fix them.  If there are any pets around you, be aware of where they are.  Review your medicines with your doctor. Some medicines can make you feel dizzy. This can increase your chance of falling. Ask your doctor what other things that you can do to help prevent falls. This information is not intended to replace advice given to you by your health care provider. Make sure you discuss any questions you have with your health care provider. Document Released: 03/11/2009 Document Revised: 10/21/2015 Document Reviewed: 06/19/2014 Elsevier Interactive Patient Education  2017 [employer].

## 2020-04-05 ENCOUNTER — Telehealth: Payer: Self-pay

## 2020-04-05 NOTE — Telephone Encounter (Signed)
[name], blethen are scheduled for a virtual visit with your provider today.    Just as we do with appointments in the office, we must obtain your consent to participate.  Your consent will be active for this visit and any virtual visit you may have with one of our providers in the next 365 days.    If you have a MyChart account, I can also send a copy of this consent to you electronically.  All virtual visits are billed to your insurance company just like a traditional visit in the office.  As this is a virtual visit, video technology does not allow for your provider to perform a traditional examination.  This may limit your provider's ability to fully assess your condition.  If your provider identifies any concerns that need to be evaluated in person or the need to arrange testing such as labs, EKG, etc, we will make arrangements to do so.    Although advances in technology are sophisticated, we cannot ensure that it will always work on either your end or our end.  If the connection with a video visit is poor, we may have to switch to a telephone visit.  With either a video or telephone visit, we are not always able to ensure that we have a secure connection.   I need to obtain your verbal consent now.   Are you willing to proceed with your visit today?   Robert Norman has provided verbal consent on 04/02/2020 for a virtual visit (video or telephone).   Robert Norman, [name] 04/02/2020  2:15 PM

## 2020-04-08 DIAGNOSIS — E785 Hyperlipidemia, unspecified: Secondary | ICD-10-CM | POA: Diagnosis not present

## 2020-04-08 DIAGNOSIS — E119 Type 2 diabetes mellitus without complications: Secondary | ICD-10-CM | POA: Diagnosis not present

## 2020-04-08 LAB — BASIC METABOLIC PANEL
BUN: 14 (ref 4–21)
CO2: 25 — AB (ref 13–22)
Chloride: 100 (ref 99–108)
Creatinine: 0.9 (ref 0.6–1.3)
Glucose: 150
Potassium: 4.4 (ref 3.4–5.3)
Sodium: 139 (ref 137–147)

## 2020-04-08 LAB — COMPREHENSIVE METABOLIC PANEL: Calcium: 10.2 (ref 8.7–10.7)

## 2020-04-08 LAB — HEMOGLOBIN A1C: Hemoglobin A1C: 5.8

## 2020-04-13 DIAGNOSIS — Z23 Encounter for immunization: Secondary | ICD-10-CM | POA: Diagnosis not present

## 2020-04-14 ENCOUNTER — Non-Acute Institutional Stay: Payer: Medicare Other | Admitting: Internal Medicine

## 2020-04-14 ENCOUNTER — Encounter: Payer: Self-pay | Admitting: Internal Medicine

## 2020-04-14 ENCOUNTER — Other Ambulatory Visit: Payer: Self-pay

## 2020-04-14 VITALS — BP 128/68 | HR 66 | Temp 97.5°F | Ht 71.0 in | Wt 200.0 lb

## 2020-04-14 DIAGNOSIS — D869 Sarcoidosis, unspecified: Secondary | ICD-10-CM

## 2020-04-14 DIAGNOSIS — G4730 Sleep apnea, unspecified: Secondary | ICD-10-CM

## 2020-04-14 DIAGNOSIS — E119 Type 2 diabetes mellitus without complications: Secondary | ICD-10-CM

## 2020-04-14 DIAGNOSIS — E782 Mixed hyperlipidemia: Secondary | ICD-10-CM

## 2020-04-14 DIAGNOSIS — Z7289 Other problems related to lifestyle: Secondary | ICD-10-CM | POA: Diagnosis not present

## 2020-04-14 DIAGNOSIS — E663 Overweight: Secondary | ICD-10-CM

## 2020-04-14 DIAGNOSIS — Z789 Other specified health status: Secondary | ICD-10-CM

## 2020-04-14 DIAGNOSIS — T383X5S Adverse effect of insulin and oral hypoglycemic [antidiabetic] drugs, sequela: Secondary | ICD-10-CM

## 2020-04-14 DIAGNOSIS — K5909 Other constipation: Secondary | ICD-10-CM | POA: Diagnosis not present

## 2020-04-14 DIAGNOSIS — N529 Male erectile dysfunction, unspecified: Secondary | ICD-10-CM | POA: Diagnosis not present

## 2020-04-14 MED ORDER — SILDENAFIL CITRATE 100 MG PO TABS: 100.0000 mg | ORAL_TABLET | Freq: Every day | ORAL | 5 refills | Status: DC | PRN

## 2020-04-14 NOTE — Progress Notes (Signed)
Location:  Occupational psychologist of Service:  Clinic (12)  Provider: Geralyn Figiel L. Mariea Clonts, D.O., C.M.D.  Goals of Care:  Advanced Directives 04/14/2020  Does Patient Have a Medical Advance Directive? Yes  Type of Advance Directive [employer]  Does patient want to make changes to medical advance directive? No - Patient declined  Copy of [place] in Chart? -  Would patient like information on creating a medical advance directive? -     Chief Complaint  Patient presents with  . Medical Management of Chronic Issues    4 months follow up     HPI: Patient is a 74 y.o. male seen today for medical management of chronic diseases.    Metformin dosage was doubled last time.  He went from 206 to 195 lbs but has already gained back since returning here.  His girlfriend is now living outside of [place] so he stayed 2 wks there last time.  They cooked together and there was more control over the meals.  She likes to walk and they go together.  Then he eats in the dining room here at [facility] and gains.  He has tried to avoid this b/c of the intake of dessert being so regular.  He tries not to have it but it's hard.  [place].    BP was initially elevated, but did come down on recheck to 128/68.  He walks to meals up hill.    He does drink less wine than he used to.  He will have a glass with her and another with dinner, but not more than that.    Sarcoid:  Takes his allergy meds every day.  Coughs, but not bad.  Does everything he can not to have mucus build-up.  More afraid of getting pneumonia again.    Bowels:  Moving well with current regimen.    Uses his cpap, no problem.  Stuck with it.    He started to take b12 being on metformin.  He actually took it before for energy when he was working (though that didn't help).  Past Medical History:  Diagnosis Date  . Asthma   .  Calcific Achilles tendonitis    bilateral  . Controlled type 2 diabetes mellitus without complication ([place])   . DDD (degenerative disc disease), cervical   . Gallstone 09/11/2011  . GERD (gastroesophageal reflux disease)   . Hyperlipidemia    mixed  . Insomnia   . Mild cognitive impairment   . Overweight (BMI 25.0-29.9)   . Prostate cancer ([place])   . Pulmonary nodule/lesion, solitary 06/2004   f/u in 2014, 2015 stable in [facility]  . Sleep apnea   . Slow transit constipation   . Vitamin D deficiency     Past Surgical History:  Procedure Laterality Date  . CERVICAL FUSION  1999 & 2004   implant metal plate  . CHOLECYSTECTOMY  2012  . COLONOSCOPY  12/2015  . EYE SURGERY  1997   laser vision correction  . palatouvulopasty  1995  . PROSTATE SURGERY  09/11/2011   prostatectomy for prostate cancer   . TONSILLECTOMY    . WISDOM TOOTH EXTRACTION      Allergies  Allergen Reactions  . Tetracyclines & Related Other (See Comments)    hepatitis  . Niacin And Related Other (See Comments)    headache  . Wellbutrin [Bupropion] Other (See  Comments)    dizziness    Outpatient Encounter Medications as of 04/14/2020  Medication Sig  . metFORMIN (GLUCOPHAGE-XR) 500 MG 24 hr tablet Take 2 tablets (1,000 mg total) by mouth 2 (two) times daily with a meal.  . polyethylene glycol (MIRALAX / GLYCOLAX) packet Take 17 g by mouth daily.  . sildenafil (VIAGRA) 50 MG tablet Take 50 mg by mouth as needed for erectile dysfunction.  . simvastatin (ZOCOR) 40 MG tablet TAKE 1 TABLET ONCE DAILY IN THE EVENING.  . Turmeric (QC TUMERIC COMPLEX) 500 MG CAPS Take 500 mg by mouth 2 (two) times daily.  . Vitamin D, Ergocalciferol, (DRISDOL) 1.25 MG (50000 UNIT) CAPS capsule TAKE 1 CAPSULE ONCE A WEEK.  . Coenzyme Q10 (CO Q 10) 100 MG CAPS Take 2 capsules by mouth daily.  Marland Kitchen loratadine (CLARITIN) 10 MG tablet Take 10 mg by mouth daily.   No facility-administered encounter medications on file as of 04/14/2020.     Review of Systems:  Review of Systems  Constitutional: Negative for chills and fever.  HENT: Positive for hearing loss. Negative for congestion and sore throat.   Eyes: Negative for blurred vision.  Respiratory: Negative for cough and shortness of breath.   Cardiovascular: Negative for chest pain, palpitations and leg swelling.  Gastrointestinal: Positive for constipation. Negative for abdominal pain, blood in stool and melena.  Genitourinary: Negative for dysuria.  Musculoskeletal: Negative for back pain, falls and joint pain.  Neurological: Negative for dizziness and loss of consciousness.  Endo/Heme/Allergies: Positive for environmental allergies. Does not bruise/bleed easily.  Psychiatric/Behavioral: Negative for depression and memory loss. The patient is not nervous/anxious and does not have insomnia.     Health Maintenance  Topic Date Due  . OPHTHALMOLOGY EXAM  07/10/2019  . FOOT EXAM  04/14/2020 (Originally 02/12/2020)  . HEMOGLOBIN A1C  06/12/2020  . URINE MICROALBUMIN  08/06/2020  . TETANUS/TDAP  05/07/2027  . COLONOSCOPY  12/16/2028  . INFLUENZA VACCINE  Completed  . COVID-19 Vaccine  Completed  . Hepatitis C Screening  Completed  . PNA vac Low Risk Adult  Completed    Physical Exam: Vitals:   04/14/20 0833  BP: 128/68  Pulse: 66  Temp: (!) 97.5 F (36.4 C)  SpO2: 96%  Weight: 200 lb (90.7 kg)  Height: 5\' 11"  (1.803 m)   Body mass index is 27.89 kg/m. Physical Exam Vitals reviewed.  Constitutional:      General: He is not in acute distress.    Appearance: Normal appearance. He is not toxic-appearing.  HENT:     Head: Normocephalic and atraumatic.  Eyes:     Extraocular Movements: Extraocular movements intact.     Pupils: Pupils are equal, round, and reactive to light.  Cardiovascular:     Rate and Rhythm: Normal rate and regular rhythm.     Pulses: Normal pulses.     Heart sounds: Normal heart sounds.  Pulmonary:     Effort: Pulmonary effort is  normal.     Breath sounds: Normal breath sounds. No wheezing, rhonchi or rales.  Abdominal:     General: Bowel sounds are normal.  Musculoskeletal:        General: Normal range of motion.     Right lower leg: No edema.     Left lower leg: No edema.  Skin:    General: Skin is warm and dry.  Neurological:     General: No focal deficit present.     Mental Status: He is alert and oriented  to person, place, and time.     Gait: Gait normal.     Comments: Diabetic foot exam was performed with the following findings:   No deformities, ulcerations, or other skin breakdown Normal sensation of 10g monofilament Intact posterior tibialis and dorsalis pedis pulses     Psychiatric:        Mood and Affect: Mood normal.        Behavior: Behavior normal.     Labs reviewed: Basic Metabolic Panel: Recent Labs    08/07/19 0800 12/11/19 0000  NA 137 138  K 4.6 5.0  CL 100 102  CO2 25* 24*  BUN 18 15  CREATININE 1.0 0.9  0.9  CALCIUM 9.5 9.5  9.5   Liver Function Tests: No results for input(s): AST, ALT, ALKPHOS, BILITOT, PROT, ALBUMIN in the last 8760 hours. No results for input(s): LIPASE, AMYLASE in the last 8760 hours. No results for input(s): AMMONIA in the last 8760 hours. CBC: No results for input(s): WBC, NEUTROABS, HGB, HCT, MCV, PLT in the last 8760 hours. Lipid Panel: Recent Labs    12/11/19 0000  CHOL 113  HDL 41  LDLCALC 48  TRIG 120   Lab Results  Component Value Date   HGBA1C 7.8 12/11/2019   Assessment/Plan 1. Controlled type 2 diabetes mellitus without complication, without long-term current use of insulin (HCC) -cont current metformin, improved diet, exercise and decreased wine intake  2. Alcohol use -has reduced to 2 glasses of wine per day  3. Mixed hyperlipidemia -cont zocor 40mg  daily -f/u lab next time  4. Overweight (BMI 25.0-29.9) -cont to work on diet and exercise   5. Sleep apnea, unspecified type -cont CPAP, doing fine  6.  Sarcoidosis -continue to avoid congestion and manage allergies to prevent flare-ups and pneumonias  7. Chronic constipation -doing fine with current routine and activity  8. Vasculogenic erectile dysfunction, unspecified vasculogenic erectile dysfunction type - has been using 2 of the 50mg  pills so increase pills to 100mg  so he can use one - sildenafil (VIAGRA) 100 MG tablet; Take 1 tablet (100 mg total) by mouth daily as needed for erectile dysfunction.  Dispense: 10 tablet; Refill: 5  9. Adverse effect of metformin, sequela -check for b12 deficiency, taking oral supplement  Labs/tests ordered:  B12, hba1c, bmp, flp Next appt:  4 mos.  Gracielynn Birkel L. Lanetta Figuero, D.O. [facility] [address], [place] [address] Cell Phone (Mon-Fri 8am-5pm):  [phone] On Call:  [phone] & follow prompts after 5pm & weekends Office Phone:  [phone] Office Fax:  [phone]

## 2020-04-15 ENCOUNTER — Encounter: Payer: Self-pay | Admitting: Internal Medicine

## 2020-04-21 ENCOUNTER — Encounter: Payer: Self-pay | Admitting: Internal Medicine

## 2020-06-18 ENCOUNTER — Telehealth: Payer: Self-pay | Admitting: Internal Medicine

## 2020-06-18 DIAGNOSIS — N529 Male erectile dysfunction, unspecified: Secondary | ICD-10-CM

## 2020-06-18 IMAGING — US US BIOPSY LYMPH NODE
1 series · 13 of 17 positions shown · non-contrast
Comparison: PET-CT-11/09/2017;

INDICATION: No known primary, now with extensive cervical, mediastinal and
retroperitoneal adenopathy. Please perform CT-guided biopsy for
tissue diagnostic purposes.

EXAM:
ULTRASOUND-GUIDED RIGHT CERVICAL LYMPH NODE BIOPSY
TECHNIQUE: Informed written consent was obtained from the patient after a
discussion of the risks, benefits and alternatives to treatment.
Questions regarding the procedure were encouraged and answered.
Initial ultrasound scanning demonstrated an approximately 1.7 x
x 1.1 cm lymph node correlating with the dominant hypermetabolic
cervical lymph node seen on preceding PET-CT image 42, series 4)..
An ultrasound image was saved for documentation purposes. The
procedure was planned. A timeout was performed prior to the
initiation of the procedure.

[Series 1: us biopsy lymph node · 0.06mm/px · 13 of 17 slices shown]
[im 1/17]
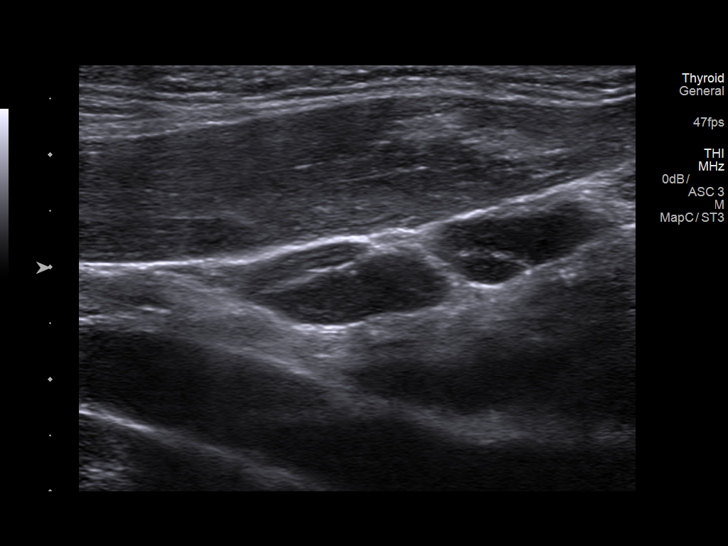
[im 2/17]
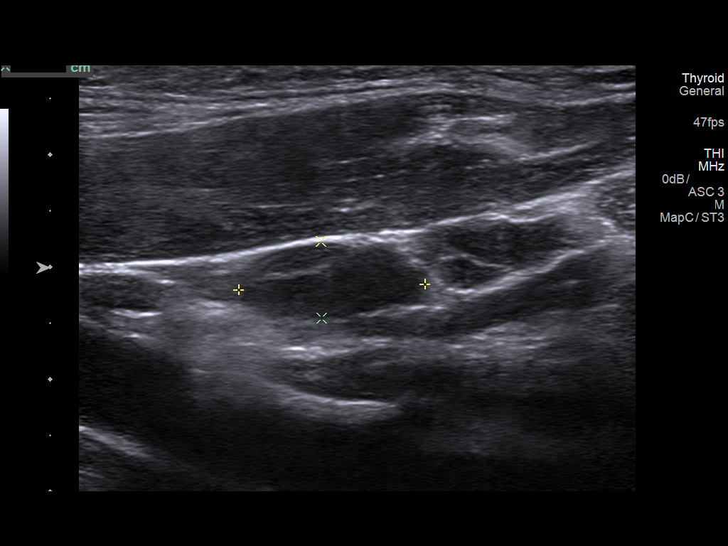
[im 4/17]
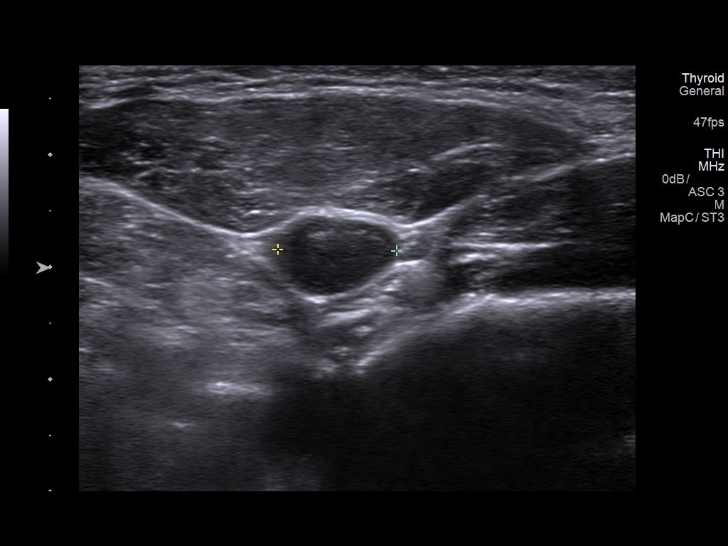
[im 5/17]
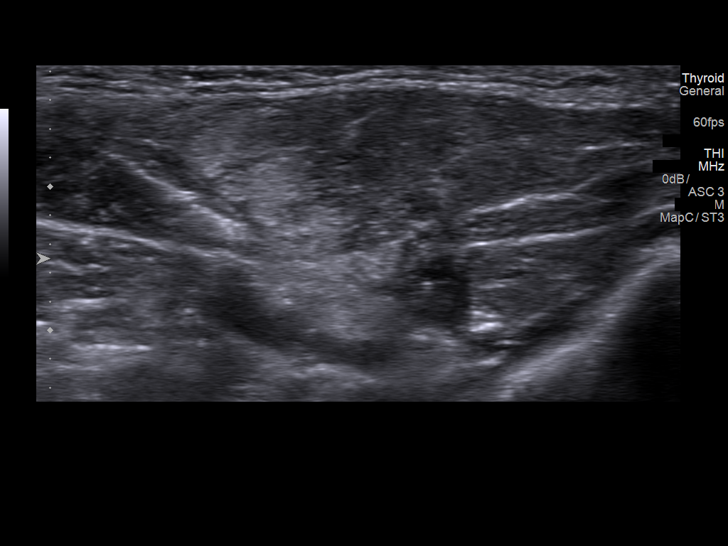
[im 6/17]
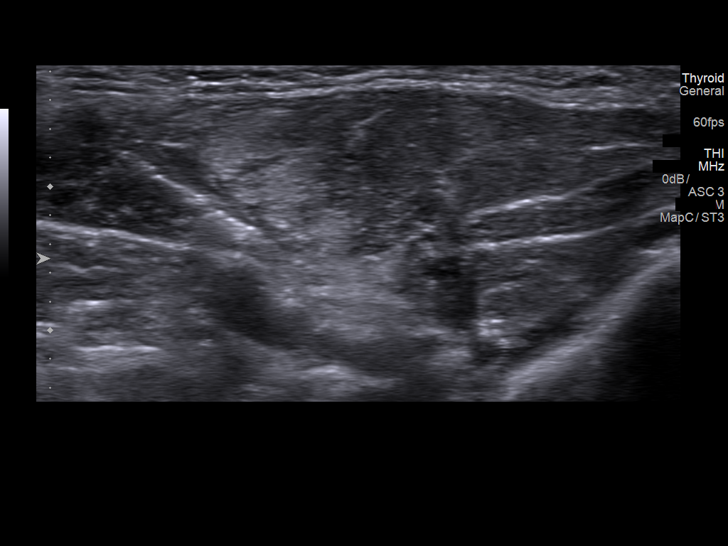
[im 8/17]
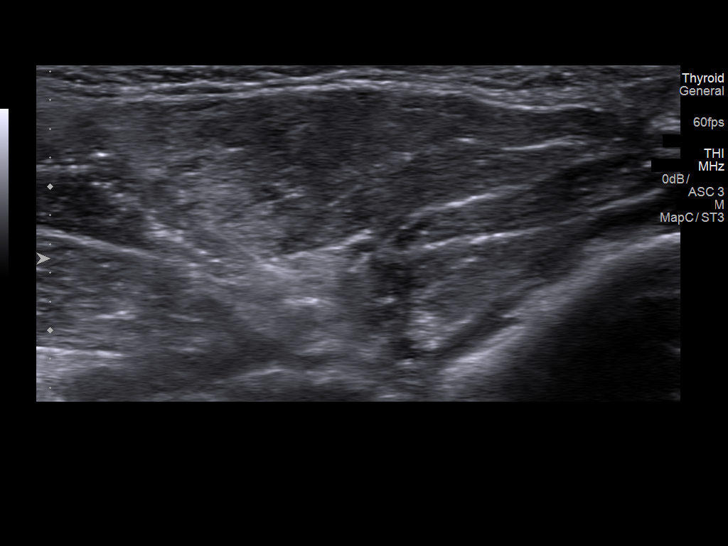
[im 9/17]
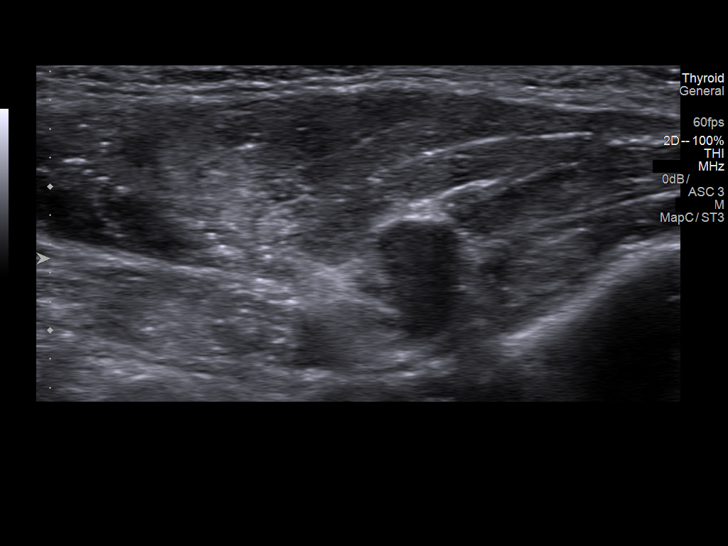
[im 10/17]
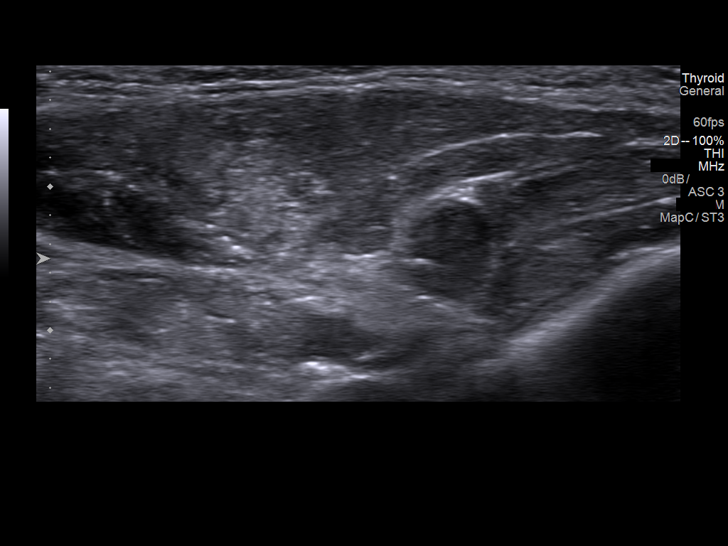
[im 12/17]
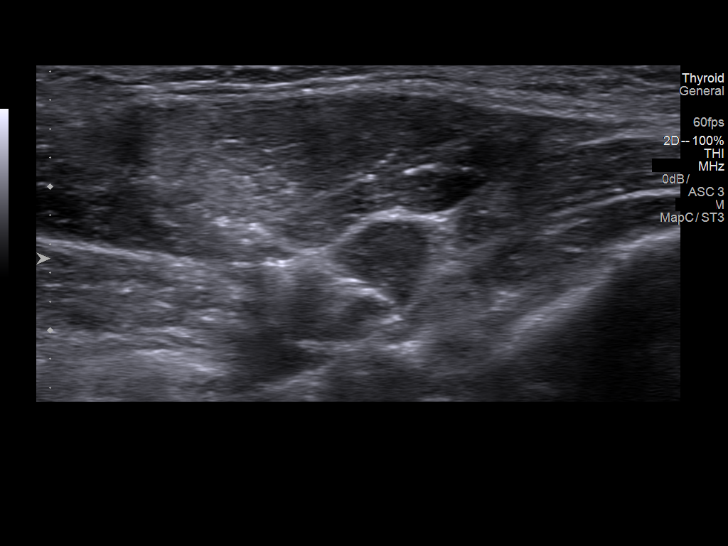
[im 13/17]
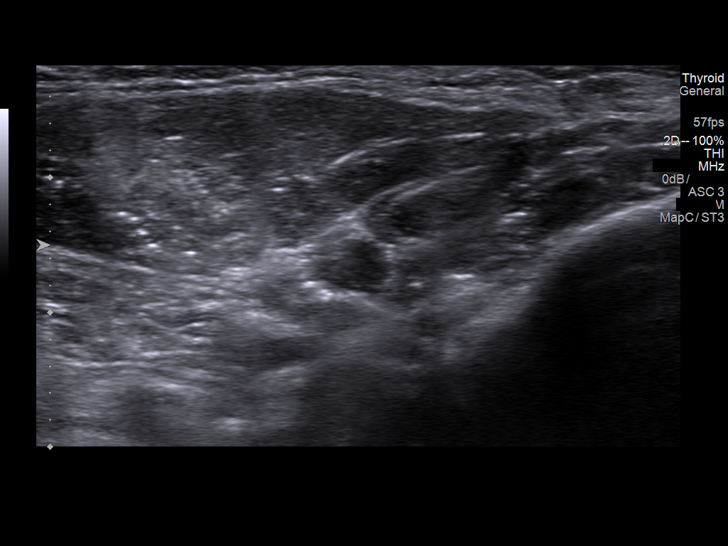
[im 14/17]
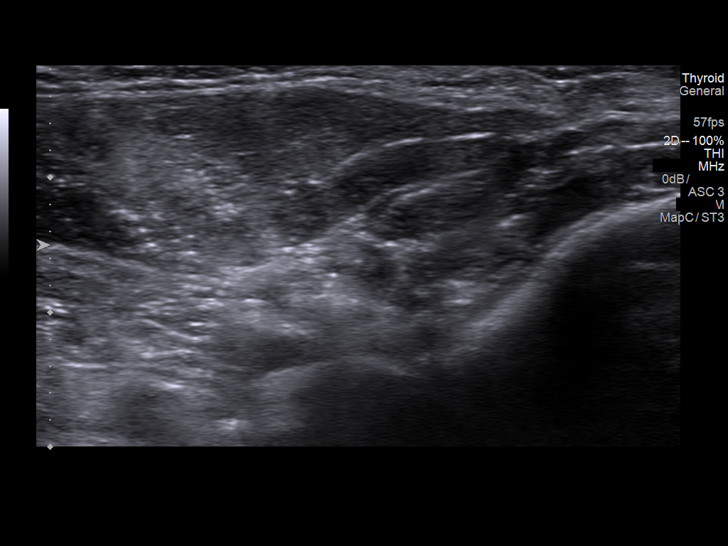
[im 16/17]
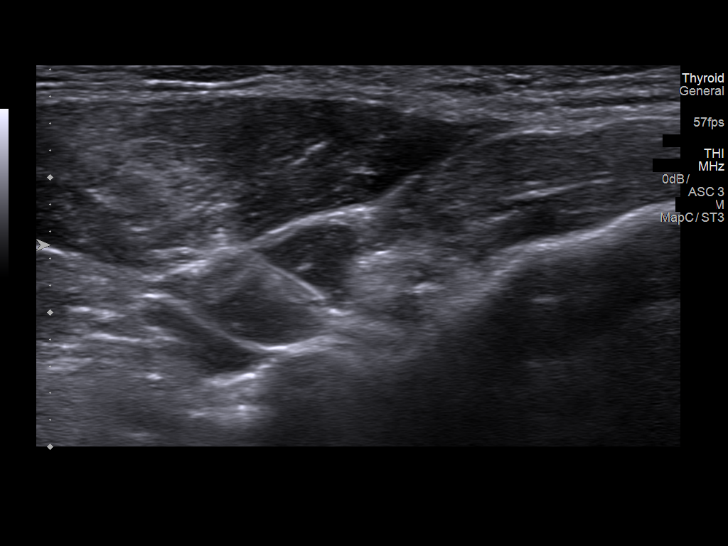
[im 17/17]
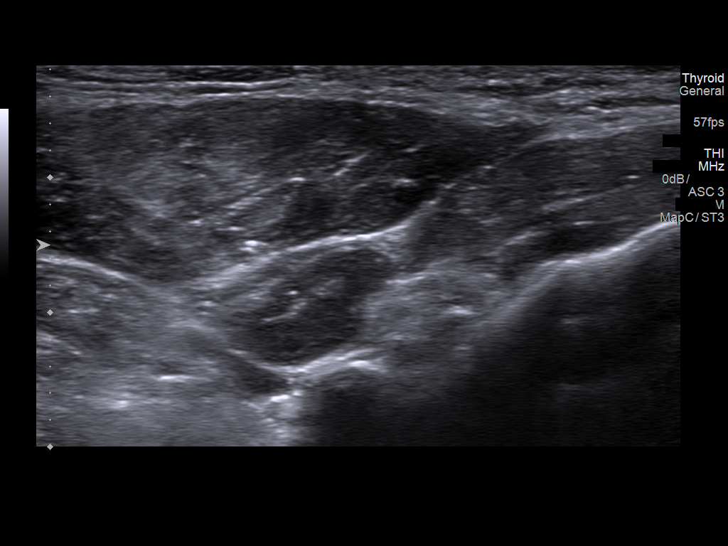

[13 of 17 positions shown; findings below may reference images not displayed]

chest CT-10/19/2017

MEDICATIONS:
None

ANESTHESIA/SEDATION:
Moderate (conscious) sedation was employed during this procedure. A
total of Versed 1.5 mg and Fentanyl 75 mcg was administered
intravenously.

Moderate Sedation Time: 14 minutes. The patient's level of
consciousness and vital signs were monitored continuously by
radiology nursing throughout the procedure under my direct
supervision.

COMPLICATIONS:
None immediate.
The operative was prepped and draped in the usual sterile fashion,
and a sterile drape was applied covering the operative field. A
timeout was performed prior to the initiation of the procedure.
Local anesthesia was provided with 1% lidocaine with epinephrine.

Under direct ultrasound guidance, an 18 gauge core needle device was
utilized to obtain to obtain 6 core needle biopsies of the dominant
hypermetabolic right cervical lymph node.

The samples were placed in saline and submitted to pathology. The
needle was removed and hemostasis was achieved with manual
compression. Post procedure scan was negative for significant
hematoma. A dressing was placed. The patient tolerated the procedure
well without immediate postprocedural complication.
IMPRESSION: Technically successful ultrasound guided biopsy of dominant
hypermetabolic right cervical lymph node.

## 2020-06-18 MED ORDER — SILDENAFIL CITRATE 100 MG PO TABS: 100.0000 mg | ORAL_TABLET | Freq: Every day | ORAL | 0 refills | Status: DC | PRN

## 2020-06-18 MED ORDER — VITAMIN D (ERGOCALCIFEROL) 1.25 MG (50000 UNIT) PO CAPS
ORAL_CAPSULE | ORAL | 0 refills | Status: DC
Start: 2020-06-18 — End: 2020-10-01

## 2020-06-18 NOTE — Telephone Encounter (Signed)
Patient needs a Vitamin D refill and a refill on Viagra generic brand  [facility] in [place], [place]

## 2020-06-18 NOTE — Telephone Encounter (Signed)
RX's approved as requested, notation made on pending appointment to discuss checking vit D level as its been greater than 24 months since last checked

## 2020-06-21 ENCOUNTER — Other Ambulatory Visit: Payer: Self-pay | Admitting: *Deleted

## 2020-06-21 MED ORDER — SIMVASTATIN 40 MG PO TABS
ORAL_TABLET | ORAL | 1 refills | Status: DC
Start: 2020-06-21 — End: 2021-01-10

## 2020-06-21 NOTE — Telephone Encounter (Signed)
[facility] [place] requested refill.

## 2020-07-19 ENCOUNTER — Encounter: Payer: Self-pay | Admitting: Internal Medicine

## 2020-07-22 DIAGNOSIS — Z961 Presence of intraocular lens: Secondary | ICD-10-CM | POA: Diagnosis not present

## 2020-08-05 DIAGNOSIS — E782 Mixed hyperlipidemia: Secondary | ICD-10-CM | POA: Diagnosis not present

## 2020-08-05 DIAGNOSIS — E785 Hyperlipidemia, unspecified: Secondary | ICD-10-CM | POA: Diagnosis not present

## 2020-08-05 DIAGNOSIS — D518 Other vitamin B12 deficiency anemias: Secondary | ICD-10-CM | POA: Diagnosis not present

## 2020-08-05 DIAGNOSIS — E119 Type 2 diabetes mellitus without complications: Secondary | ICD-10-CM | POA: Diagnosis not present

## 2020-08-05 DIAGNOSIS — D869 Sarcoidosis, unspecified: Secondary | ICD-10-CM | POA: Diagnosis not present

## 2020-08-05 LAB — BASIC METABOLIC PANEL
BUN: 15 (ref 4–21)
CO2: 26 — AB (ref 13–22)
Chloride: 102 (ref 99–108)
Creatinine: 1 (ref 0.6–1.3)
Glucose: 144
Potassium: 4.9 (ref 3.4–5.3)
Sodium: 139 (ref 137–147)

## 2020-08-05 LAB — LIPID PANEL
Cholesterol: 107 (ref 0–200)
HDL: 47 (ref 35–70)
LDL Cholesterol: 49
Triglycerides: 57 (ref 40–160)

## 2020-08-05 LAB — HEMOGLOBIN A1C: Hemoglobin A1C: 5.8

## 2020-08-05 LAB — VITAMIN B12: Vitamin B-12: 654

## 2020-08-05 LAB — COMPREHENSIVE METABOLIC PANEL: Calcium: 9.8 (ref 8.7–10.7)

## 2020-08-06 ENCOUNTER — Encounter: Payer: Self-pay | Admitting: *Deleted

## 2020-08-11 ENCOUNTER — Other Ambulatory Visit: Payer: Self-pay

## 2020-08-11 ENCOUNTER — Encounter: Payer: Self-pay | Admitting: Internal Medicine

## 2020-08-11 ENCOUNTER — Non-Acute Institutional Stay: Payer: Medicare Other | Admitting: Internal Medicine

## 2020-08-11 VITALS — BP 140/78 | HR 71 | Temp 97.0°F | Ht 71.0 in | Wt 198.2 lb

## 2020-08-11 DIAGNOSIS — N529 Male erectile dysfunction, unspecified: Secondary | ICD-10-CM | POA: Diagnosis not present

## 2020-08-11 DIAGNOSIS — Z7289 Other problems related to lifestyle: Secondary | ICD-10-CM | POA: Diagnosis not present

## 2020-08-11 DIAGNOSIS — K5909 Other constipation: Secondary | ICD-10-CM

## 2020-08-11 DIAGNOSIS — E782 Mixed hyperlipidemia: Secondary | ICD-10-CM

## 2020-08-11 DIAGNOSIS — E1169 Type 2 diabetes mellitus with other specified complication: Secondary | ICD-10-CM

## 2020-08-11 DIAGNOSIS — H6121 Impacted cerumen, right ear: Secondary | ICD-10-CM

## 2020-08-11 DIAGNOSIS — G4730 Sleep apnea, unspecified: Secondary | ICD-10-CM

## 2020-08-11 DIAGNOSIS — Z789 Other specified health status: Secondary | ICD-10-CM

## 2020-08-11 NOTE — Progress Notes (Signed)
Location:  Occupational psychologist of Service:  Clinic (12)  Provider: Ernesteen Mihalic L. Mariea Clonts, D.O., C.M.D.  Goals of Care:  Advanced Directives 08/11/2020  Does Patient Have a Medical Advance Directive? Yes  Type of Advance Directive Living will;Healthcare Power of Attorney  Does patient want to make changes to medical advance directive? No - Patient declined  Copy of [place] in Chart? -  Would patient like information on creating a medical advance directive? -     Chief Complaint  Patient presents with  . Medical Management of Chronic Issues    Medical Management of Chronic Issues. 4 Month Follow up    HPI: Patient is a 75 y.o. male seen today for medical management of chronic diseases.    He's happy to see he's under 200 lbs.    Hba1c 5.8 in prediabetic range.  When he is not here for a week or two, he loses 3 lbs, then comes back and gains it back.    B12 level now ok with the supplement.    Triglycerides improved, LDL stable.  He's tolerating statin therapy, but did ask why he must take it with his cholesterol being good--educated on diabetes increasing risk for heart disease and stroke.    If it's too windy, rainy, cold, he does not walk.  He's back out there b/c weather is better.  He cannot lift weights due to his metal plate and neck troubles.    Wine intake is "steady".  2 glasses with dinner last night.  Tries to keep to 2-3 times per week, 2 glasses.  He has more trouble getting away from the dessert.  After his second glass of wine, he is more inclined to have the dessert.  Continues his metformin.    Doing fine with miralax for bowel movements.   His girlfriend, Langley Gauss, still does not understand why he's here, so he continues to go back and forth to [place].    Sarcoid:  3 yrs since he's had pneumonia.  Turned out he really did not have asthma per pulmonary.  Takes his 12 hr allergy pill and does not cough as much overnight.   Uses mucus tablet 2-3 times a week.  That helped him a ton.  Still coughs if hot.    Labs indicated he'd had exposure to hepatitis C and then HCV RNA was negative.  He'd had mono in the [employer].  He was exposed to a harsh chemical and nauseated and sick for several days prior to his hospitalization. He was in the infectious disease ward and was told back then that he had hepatitis and not to give blood.  He reviewed his [employer] records and there was nothing about it.  He had yellow jaundice.  Sounds more like he had hepatitis A then at about 75 yo.   He is wondering if he has earwax.    He had his eye exam last month in [place].    Past Medical History:  Diagnosis Date  . Asthma   . Calcific Achilles tendonitis    bilateral  . Controlled type 2 diabetes mellitus without complication ([place])   . DDD (degenerative disc disease), cervical   . Gallstone 09/11/2011  . GERD (gastroesophageal reflux disease)   . Hyperlipidemia    mixed  . Insomnia   . Mild cognitive impairment   . Overweight (BMI 25.0-29.9)   . Prostate cancer ([place])   . Pulmonary nodule/lesion, solitary 06/2004   f/u in  2014, 2015 stable in [facility]  . Sleep apnea   . Slow transit constipation   . Vitamin D deficiency     Past Surgical History:  Procedure Laterality Date  . CERVICAL FUSION  1999 & 2004   implant metal plate  . CHOLECYSTECTOMY  2012  . COLONOSCOPY  12/2015  . EYE SURGERY  1997   laser vision correction  . palatouvulopasty  1995  . PROSTATE SURGERY  09/11/2011   prostatectomy for prostate cancer   . TONSILLECTOMY    . WISDOM TOOTH EXTRACTION      Allergies  Allergen Reactions  . Tetracyclines & Related Other (See Comments)    hepatitis  . Niacin And Related Other (See Comments)    headache  . Wellbutrin [Bupropion] Other (See Comments)    dizziness    Outpatient Encounter Medications as of 08/11/2020  Medication Sig  . Coenzyme Q10 (CO Q 10) 100 MG CAPS Take 2 capsules by mouth daily.  Marland Kitchen  loratadine (CLARITIN) 10 MG tablet Take 10 mg by mouth daily.  . metFORMIN (GLUCOPHAGE-XR) 500 MG 24 hr tablet Take 2 tablets (1,000 mg total) by mouth 2 (two) times daily with a meal.  . polyethylene glycol (MIRALAX / GLYCOLAX) packet Take 17 g by mouth daily.  . sildenafil (VIAGRA) 100 MG tablet Take 1 tablet (100 mg total) by mouth daily as needed for erectile dysfunction.  . simvastatin (ZOCOR) 40 MG tablet Take one tablet by mouth once daily in the evening.  . Turmeric 500 MG CAPS Take 500 mg by mouth 2 (two) times daily.  . Vitamin D, Ergocalciferol, (DRISDOL) 1.25 MG (50000 UNIT) CAPS capsule TAKE 1 CAPSULE ONCE A WEEK.   No facility-administered encounter medications on file as of 08/11/2020.    Review of Systems:  Review of Systems  Constitutional: Negative for chills, fever and malaise/fatigue.  HENT: Positive for hearing loss. Negative for congestion and sore throat.        Right cerumen impaction  Eyes: Negative for blurred vision.  Respiratory: Negative for cough and shortness of breath.   Cardiovascular: Negative for chest pain, palpitations and leg swelling.  Gastrointestinal: Negative for abdominal pain and constipation.  Genitourinary: Negative for dysuria.  Musculoskeletal: Negative for falls and joint pain.  Skin: Negative for itching and rash.  Neurological: Negative for dizziness and loss of consciousness.  Endo/Heme/Allergies: Positive for environmental allergies.  Psychiatric/Behavioral: Negative for depression and memory loss. The patient is not nervous/anxious and does not have insomnia.     Health Maintenance  Topic Date Due  . OPHTHALMOLOGY EXAM  06/09/2020  . URINE MICROALBUMIN  08/06/2020  . COVID-19 Vaccine (4 - Booster) 10/11/2020  . HEMOGLOBIN A1C  02/05/2021  . FOOT EXAM  04/14/2021  . TETANUS/TDAP  05/07/2027  . COLONOSCOPY (Pts 45-35yrs Insurance coverage will need to be confirmed)  12/16/2028  . INFLUENZA VACCINE  Completed  . Hepatitis C  Screening  Completed  . PNA vac Low Risk Adult  Completed  . HPV VACCINES  Aged Out    Physical Exam: Vitals:   08/11/20 0929  BP: 140/78  Pulse: 71  Temp: (!) 97 F (36.1 C)  TempSrc: Skin  SpO2: 96%  Weight: 198 lb 3.2 oz (89.9 kg)  Height: 5\' 11"  (1.803 m)   Body mass index is 27.64 kg/m. Physical Exam Vitals reviewed.  Constitutional:      Appearance: Normal appearance. He is not toxic-appearing.  HENT:     Head: Normocephalic and atraumatic.  Right Ear: External ear normal.     Left Ear: Tympanic membrane, ear canal and external ear normal.     Ears:     Comments: Initial cerumen impaction, but after removal with metal tweezer device, scarring visible on superior aspect of TM, light reflex present Cardiovascular:     Rate and Rhythm: Normal rate and regular rhythm.     Pulses: Normal pulses.     Heart sounds: Normal heart sounds.  Pulmonary:     Effort: Pulmonary effort is normal.     Breath sounds: Normal breath sounds. No wheezing, rhonchi or rales.  Abdominal:     General: Bowel sounds are normal.  Musculoskeletal:        General: Normal range of motion.     Right lower leg: No edema.     Left lower leg: No edema.  Skin:    General: Skin is warm and dry.  Neurological:     General: No focal deficit present.     Mental Status: He is alert and oriented to person, place, and time.  Psychiatric:        Mood and Affect: Mood normal.     Labs reviewed: Basic Metabolic Panel: Recent Labs    12/11/19 0000 04/08/20 0000 08/05/20 0000  NA 138 139 139  K 5.0 4.4 4.9  CL 102 100 102  CO2 24* 25* 26*  BUN 15 14 15   CREATININE 0.9  0.9 0.9 1.0  CALCIUM 9.5  9.5 10.2 9.8   Liver Function Tests: No results for input(s): AST, ALT, ALKPHOS, BILITOT, PROT, ALBUMIN in the last 8760 hours. No results for input(s): LIPASE, AMYLASE in the last 8760 hours. No results for input(s): AMMONIA in the last 8760 hours. CBC: No results for input(s): WBC,  NEUTROABS, HGB, HCT, MCV, PLT in the last 8760 hours. Lipid Panel: Recent Labs    12/11/19 0000 08/05/20 0000  CHOL 113 107  HDL 41 47  LDLCALC 48 49  TRIG 120 57   Lab Results  Component Value Date   HGBA1C 5.8 08/05/2020    Procedures since last visit: No results found.  Assessment/Plan 1. Controlled type 2 diabetes mellitus with other specified complication, without long-term current use of insulin (HCC) -has sleep apnea, hyperlipidemia due to this -well controlled into prediabetic range with metformin  -cont diet and exercise as well, avoid excess alcohol and desserts  2. Mixed hyperlipidemia -cont zocor therapy due to DMII and tolerating fine  3. Alcohol use -educated on recommendation of no more than 2 glasses of wine per day and he's trying to stick with this  4. Sleep apnea, unspecified type -cont cpap  5. Vasculogenic erectile dysfunction, unspecified vasculogenic erectile dysfunction type -cont viagra  6. Chronic constipation -bowels moving well with miralax  7.  Right ear cerumen impaction -mechanically debrided with metal tweezer device today with improved hearing and visibility of scarred TM  Labs/tests ordered:  Urine micro, hba1c, cbc, cmp Next appt:  6 mos med mgt  Anika Shore L. Lorma Heater, D.O. [facility] [address], [place] [address] Cell Phone (Mon-Fri 8am-5pm):  [phone] On Call:  [phone] & follow prompts after 5pm & weekends Office Phone:  [phone] Office Fax:  [phone]

## 2020-08-23 ENCOUNTER — Telehealth: Payer: Self-pay | Admitting: Pulmonary Disease

## 2020-08-23 NOTE — Telephone Encounter (Signed)
Spoke with the pt  He is currently on CPAP prescribed by an MD in [place], [place]  He would like to have this managed by one of our sleep specialists  Appt scheduled for 09/28/20 with Dr Elsworth Soho  Nothing further needed

## 2020-09-28 ENCOUNTER — Ambulatory Visit (INDEPENDENT_AMBULATORY_CARE_PROVIDER_SITE_OTHER): Payer: Medicare Other | Admitting: Pulmonary Disease

## 2020-09-28 ENCOUNTER — Other Ambulatory Visit: Payer: Self-pay

## 2020-09-28 ENCOUNTER — Encounter: Payer: Self-pay | Admitting: Pulmonary Disease

## 2020-09-28 ENCOUNTER — Ambulatory Visit (INDEPENDENT_AMBULATORY_CARE_PROVIDER_SITE_OTHER): Payer: Medicare Other

## 2020-09-28 DIAGNOSIS — G4733 Obstructive sleep apnea (adult) (pediatric): Secondary | ICD-10-CM | POA: Diagnosis not present

## 2020-09-28 DIAGNOSIS — D869 Sarcoidosis, unspecified: Secondary | ICD-10-CM | POA: Diagnosis not present

## 2020-09-28 DIAGNOSIS — Z9989 Dependence on other enabling machines and devices: Secondary | ICD-10-CM

## 2020-09-28 MED ORDER — PREDNISONE 10 MG (21) PO TBPK: ORAL_TABLET | Freq: Every day | ORAL | 0 refills | Status: DC

## 2020-09-28 NOTE — Assessment & Plan Note (Addendum)
Good pulmonary function noted in 2019. He now has a dry persistent cough.  Will obtain chest x-ray today.  No overt reflux, he takes Claritin for allergies  Chest x-ray independently reviewed, no new infiltrates or nodules. Will use low-dose prednisone which will cover both allergic postnasal drip and sarcoid flare Prednisone 10 mg tabs  Take 2 tabs daily with food x 5ds, then 1 tab daily with food x 5ds then STOP

## 2020-09-28 NOTE — Patient Instructions (Signed)
Chest x-ray today for cough, to see if sarcoidosis is flaring up. Will obtain-your -old sleep studies. We will then get you established with a local DME and get you CPAP supplies as needed

## 2020-09-28 NOTE — Assessment & Plan Note (Signed)
CPAP download was reviewed.  He has an air sense AutoSet which is set at 9 cm.  Download shows excellent control of events and minimal leak with great compliance more than 7 hours every night over the last 3 months. We will obtain his old sleep studies. We will then get him established with a local DME and provide him a prescription for CPAP supplies. He can follow-up with [place] on a yearly basis.  CPAP is certainly helped improve his daytime somnolence and fatigue

## 2020-09-28 NOTE — Addendum Note (Signed)
Addended byCoralie Keens on: 09/28/2020 02:13 PM   Modules accepted: Orders

## 2020-09-28 NOTE — Progress Notes (Addendum)
Subjective:    Patient ID: Robert Norman, male    DOB: 12/30/1945, 75 y.o.   MRN: [id number]  HPI  75 year old never smoker presents to establish care for's OSA.  He is previously followed with Dr[facility] Heinz Knuckles , [facility] [place].  PMH: Diabetes, prostate cancer Sarcoidosis was diagnosed in 2019 after cervical lymph node biopsy, when he presented with multiple pulmonary nodules.  OSA was diagnosed in 2002.  Last study was 2015 which is a split study.  He has been on his current AutoSet air sense 10 device since 12/2013 maintained on 9 cm .  I have reviewed his last sleep office visit from 02/2019. Epworth sleepiness score is 9, bedtime is around 10:30 PM, sleep latency is minimal, he sleeps on his back with 1 cervical pillow, he is settled down with a fullface mask, reports 1 nocturnal awakening and is out of bed by 7:15 AM feeling refreshed without dryness of mouth or headaches. His weight is mostly unchanged over the last 2 years. He would like to establish with a local DME and obtain some supplies.  He reports a dry cough for the past few weeks, no dyspnea or chest pain.  No skin lesions or   Significant tests/ events reviewed  11/2017-pulmonary function test- FVC 4.34 (96 predicted), FEF ratio 79, FEV1 3.43 (104 predicted), DLCO 92, patient did not want to complete postbronchodilator testing.  Nitrogen washout was performed due to patient being unable to perform Pleth  11/09/2017-PET scan- fairly extensive metastases disease of uncertain origin, multiple sites of uptake 10/19/2017-CT high-res- no findings of interstitial lung disease, there are multiple pulmonary nodules scattered throughout lungs bilaterally findings of sepsis suspicious for potential metastatic disease   Split PSG 12/2013 AHI 38/h, RDI 64/h, lowest sat 88% >> CPAP 9 cm ,210 lbs  10/2000 NPSG >> AHI 75/h 09/2003 CPAP 10 cm  Past Medical History:  Diagnosis Date  . Asthma   . Calcific Achilles tendonitis     bilateral  . Controlled type 2 diabetes mellitus without complication ([place])   . DDD (degenerative disc disease), cervical   . Gallstone 09/11/2011  . GERD (gastroesophageal reflux disease)   . Hyperlipidemia    mixed  . Insomnia   . Mild cognitive impairment   . Overweight (BMI 25.0-29.9)   . Prostate cancer ([place])   . Pulmonary nodule/lesion, solitary 06/2004   f/u in 2014, 2015 stable in [facility]  . Sleep apnea   . Slow transit constipation   . Vitamin D deficiency     Past Surgical History:  Procedure Laterality Date  . CERVICAL FUSION  1999 & 2004   implant metal plate  . CHOLECYSTECTOMY  2012  . COLONOSCOPY  12/2015  . EYE SURGERY  1997   laser vision correction  . palatouvulopasty  1995  . PROSTATE SURGERY  09/11/2011   prostatectomy for prostate cancer   . TONSILLECTOMY    . WISDOM TOOTH EXTRACTION      Allergies  Allergen Reactions  . Tetracyclines & Related Other (See Comments)    hepatitis  . Niacin And Related Other (See Comments)    headache  . Wellbutrin [Bupropion] Other (See Comments)    dizziness    Social History   Socioeconomic History  . Marital status: Widowed    Spouse name: Not on file  . Number of children: Not on file  . Years of education: Not on file  . Highest education level: Not on file  Occupational History  . Occupation:  accounting    Comment: Agricultural consultant  Tobacco Use  . Smoking status: Never Smoker  . Smokeless tobacco: Never Used  Vaping Use  . Vaping Use: Never used  Substance and Sexual Activity  . Alcohol use: Not Currently  . Drug use: No  . Sexual activity: Yes  Other Topics Concern  . Not on file  Social History Narrative   Tobacco use, amount per day now: NEVER USED   Past tobacco use, amount per day: NEVER USED   How many years did you use tobacco: NONE   Alcohol use (drinks per week): 10 GLASSES OF WINE A WEEK   Diet:   Do you drink/eat things with caffeine: YES   Marital status:  WIDOWED                     What year were you married?   Do you live in a house, apartment, assisted living, condo, trailer, etc.? APARTMENT   Is it one or more stories? ONE STORY   How many persons live in your home? NONE   Do you have pets in your home?( please list) NO   Current or past profession: FINANCIAL   Do you exercise?  YES                                Type and how often? WALKING 1-2 HOURS   Do you have a living will? YES   Do you have a DNR form?    NO                               If not, do you want to discuss one?   Do you have signed POA/HPOA forms?   YES                     If so, please bring to you appointment   Social Determinants of Health   Financial Resource Strain: Not on file  Food Insecurity: Not on file  Transportation Needs: Not on file  Physical Activity: Not on file  Stress: Not on file  Social Connections: Not on file  Intimate Partner Violence: Not on file    Family History  Problem Relation Age of Onset  . Cancer Mother   . Heart disease Father   . Heart disease Brother   . Diabetes Brother   . Colon cancer Neg Hx   . Rectal cancer Neg Hx   . Stomach cancer Neg Hx   . Esophageal cancer Neg Hx       Review of Systems Constitutional: negative for anorexia, fevers and sweats  Eyes: negative for irritation, redness and visual disturbance  Ears, nose, mouth, throat, and face: negative for earaches, epistaxis, nasal congestion and sore throat  Respiratory: negative for  dyspnea on exertion, sputum and wheezing  Cardiovascular: negative for chest pain, dyspnea, lower extremity edema, orthopnea, palpitations and syncope  Gastrointestinal: negative for abdominal pain, constipation, diarrhea, melena, nausea and vomiting  Genitourinary:negative for dysuria, frequency and hematuria  Hematologic/lymphatic: negative for bleeding, easy bruising and lymphadenopathy  Musculoskeletal:negative for arthralgias, muscle weakness and stiff joints  Neurological: negative for  coordination problems, gait problems, headaches and weakness  Endocrine: negative for diabetic symptoms including polydipsia, polyuria and weight loss     Objective:   Physical Exam  Gen. Pleasant, well-nourished, in no distress,  normal affect ENT - no pallor,icterus, no post nasal drip Neck: No JVD, no thyromegaly, no carotid bruits Lungs: no use of accessory muscles, no dullness to percussion, clear without rales or rhonchi  Cardiovascular: Rhythm regular, heart sounds  normal, no murmurs or gallops, no peripheral edema Abdomen: soft and non-tender, no hepatosplenomegaly, BS normal. Musculoskeletal: No deformities, no cyanosis or clubbing Neuro:  alert, non focal       Assessment & Plan:

## 2020-09-30 ENCOUNTER — Telehealth: Payer: Self-pay | Admitting: Pulmonary Disease

## 2020-09-30 NOTE — Telephone Encounter (Signed)
Found sleep documents in Dr. Matilde Bash look-at. I have placed the documents in RA's folder for review.   I called [facility] and let them know that we did receive the documents.   Nothing further needed at time of call.

## 2020-10-01 ENCOUNTER — Other Ambulatory Visit: Payer: Self-pay

## 2020-10-01 MED ORDER — VITAMIN D (ERGOCALCIFEROL) 1.25 MG (50000 UNIT) PO CAPS: ORAL_CAPSULE | ORAL | 0 refills | Status: DC

## 2020-10-14 ENCOUNTER — Ambulatory Visit: Payer: Medicare Other | Admitting: Pulmonary Disease

## 2020-10-14 ENCOUNTER — Telehealth: Payer: Self-pay | Admitting: *Deleted

## 2020-10-14 ENCOUNTER — Telehealth: Payer: Self-pay | Admitting: Pulmonary Disease

## 2020-10-14 NOTE — Telephone Encounter (Signed)
Called and spoke with patient, advised that we have to get his sleep study before we can get him established with a new DME company.  Patient stated that he saw Dr. Susie Cassette in [place].  He states he has plenty of supplies at this point so it is no rush now.  Advised I would contact the physician office to get his sleep study so we can move forward with getting his supplies.  He verbalized understanding.  Called Dr. Norvel Richards office (neurologist) at [phone] and was given the fax # [phone] to request his sleep study results.  Request faxed to Dr. Marguarite Arbour office.  Will await sleep study.

## 2020-10-14 NOTE — Telephone Encounter (Signed)
Called and spoke with patient, advised that we need to get a copy of his sleep study before we can get orders to a new DME company to get him new supplies.  He states he currently has plenty of supplies, there is no rush.  Advised I would contact Dr. Marguarite Arbour office to request a copy of his sleep study.  He verbalized understanding.  Called the office of Dr. Susie Cassette [phone]) at [facility].  I was given [phone] to fax the request for the records.  Request faxed, received a fax that it was sent successfully.  Will await records.

## 2020-10-14 NOTE — Telephone Encounter (Signed)
Pt was seen on 5/3 with Dr. Elsworth Soho who discussed getting supplies for his sleep apnea. Has not been contacted by any company regarding this. Wants to know what company will be calling him as he does not answer the phone if he doesn't recognize the number. Message may be left if he does not answer. Please advise.

## 2020-10-15 NOTE — Telephone Encounter (Signed)
I checked both RA's lookat in B pod as well as up front and no sleep study received yet. Will continue to wait for this.

## 2020-10-20 NOTE — Telephone Encounter (Signed)
[facility] LVMTCB x 1  

## 2020-10-20 NOTE — Telephone Encounter (Signed)
[facility] and spoke to [place]. She states the request went to medical records and this can take a while. She states because this is just a sleep study she is sending the request to the clinical staff to fax to [place].

## 2020-10-22 NOTE — Telephone Encounter (Signed)
Patient's sleep study results are in Dr. Bari Mantis paperwork folder. He is back in office. 10/28/20

## 2020-10-22 NOTE — Telephone Encounter (Signed)
Patient came into office today and signed the medical release form.  The form has been faxed to the [phone]. And received confirmation.

## 2020-10-22 NOTE — Telephone Encounter (Signed)
Spoke to patient and relayed below message.  Patient stated that he would come by [place] office today to sign medical record release.   Triage, please place form up front.

## 2020-10-26 NOTE — Telephone Encounter (Signed)
Will forward to triage to look for sleep study.

## 2020-10-26 NOTE — Telephone Encounter (Signed)
Located a copy of patient's sleep study in RA's mail. I have placed the sleep study in his look-at folder for him to review when he is back at the [place] office on 11/01/20.   Will close encounter.

## 2020-10-27 DIAGNOSIS — L989 Disorder of the skin and subcutaneous tissue, unspecified: Secondary | ICD-10-CM | POA: Diagnosis not present

## 2020-11-17 ENCOUNTER — Telehealth: Payer: Self-pay | Admitting: Pulmonary Disease

## 2020-11-17 DIAGNOSIS — G4733 Obstructive sleep apnea (adult) (pediatric): Secondary | ICD-10-CM

## 2020-11-17 DIAGNOSIS — Z9989 Dependence on other enabling machines and devices: Secondary | ICD-10-CM

## 2020-11-17 NOTE — Telephone Encounter (Signed)
Attempted to call pt but unable to reach. Left message for him to return call. °

## 2020-11-17 NOTE — Telephone Encounter (Signed)
Pt is calling in regards to CPAP supplies and possibly a new machine; pt stated he signed a medical record release form a few wks ago but hasnt heard anything back and he said he got an error message from his machine; says "Motor life exceeded contract". Pls regard; [phone]; Pt is also wanting to know if we received information from Dr. Rochele Raring

## 2020-11-18 NOTE — Telephone Encounter (Signed)
Called and spoke with patient, provided information to patient that Dr. Elsworth Soho said we could send an order for his CPAP supplies.  He did not have a preference on which DME company we sent it to.  Advised I would send it to [facility].  He said he received an error message from his machine; says "Motor life exceeded contract".  His machine is 75 years old and it is still working.  Dr. Elsworth Soho, Please advise regarding CPAP machine.  Thank you.

## 2020-11-18 NOTE — Telephone Encounter (Signed)
Pt returning a phone call. Pt can be reached at [phone]. Pt need to get cpap supplies expedited. Pt needs a new DME company so that he is able to get a new cpap machine.

## 2020-11-18 NOTE — Telephone Encounter (Signed)
Please refer to 6/6/20202 phone note.   Patient is questioning if Dr. Elsworth Soho received sleep study. He would like an order placed to local DME for cpap supplies.  Dr. Elsworth Soho, please advise on sleep study and if okay to order supplies. We have not previously ordered supplies for patient.

## 2020-11-18 NOTE — Telephone Encounter (Signed)
Called and spoke with patient, advised that [employer] had bought [facility].  He said he would just leave it as it is.  Nothing further needed.

## 2020-11-18 NOTE — Telephone Encounter (Signed)
Pt calling back to get his supplies sent to [facility] because he read the reviews on [employer] and did not like what he seen. Pt can be reached at [phone]

## 2020-11-22 ENCOUNTER — Other Ambulatory Visit: Payer: Self-pay

## 2020-11-22 ENCOUNTER — Ambulatory Visit (INDEPENDENT_AMBULATORY_CARE_PROVIDER_SITE_OTHER): Payer: Medicare Other | Admitting: Pulmonary Disease

## 2020-11-22 ENCOUNTER — Encounter: Payer: Self-pay | Admitting: Pulmonary Disease

## 2020-11-22 VITALS — BP 120/70 | HR 64

## 2020-11-22 DIAGNOSIS — G4733 Obstructive sleep apnea (adult) (pediatric): Secondary | ICD-10-CM | POA: Diagnosis not present

## 2020-11-22 DIAGNOSIS — Z9989 Dependence on other enabling machines and devices: Secondary | ICD-10-CM

## 2020-11-22 DIAGNOSIS — D869 Sarcoidosis, unspecified: Secondary | ICD-10-CM | POA: Diagnosis not present

## 2020-11-22 NOTE — Progress Notes (Addendum)
Robert Norman    [id number]    1946/02/16  Primary Care Physician:Gupta, Rene Kocher, MD  Referring Physician: [name], DO [place],  [place] [id number]  Chief complaint: Follow-up for OSA, sarcoidosis  HPI: 75 year old with history of asthma, diabetes, prostate cancer, severe sleep apnea.  He was diagnosed with asthma around 2014.  Previously followed in [place] by Dr. [name].  Maintained on Breo which helps with the symptoms.  Is also using albuterol as needed.  He has history of seasonal allergies and was given steroid nasal spray which did not help with symptoms.  Reports having PFTs about 5 years ago.  Chief complaint is dyspnea on exertion.  He gets short of breath while walking up an incline.  He has constant cough productive of Wolke mucus.  Denies any wheezing, chest congestion, chest pain, hemoptysis.  He said 3 episodes of pneumonia for the past 2 years.  Recently admitted in mid March 2019 for pneumonia with chest x-ray showing retrocardiac opacity.  He was treated for community-acquired pneumonia with IV Rocephin and azithromycin.  Discharged on p.o. Augmentin.  Urine strep pneumo and Legionella was negative. History noted for severe OSA.  He is currently on CPAP with good compliance.  Received note from [facility], pulmonary 04/17/2017 Treated for chronic cough caused by asthma.  Rx- Breo inhaler, albuterol rescue inhaler No PFTs on record.  Pets: No pets, no exposure to birds, farm animals Occupation: He was in [employer] and reports exposure to asbestos.  He later worked in the Systems analyst Exposures: Exposure to asbestos.  No mold, dampness, hot tub, Jacuzzi Smoking history: Never smoker Travel history: Lived in [place] in [place].  He was posted in [place] while in the [employer]. Relevant family history: Significant family history of lung cancer in the parents, OSA.  Interim history: Here for follow-up, last seen by me  in 2019.  At that time he got supraclavicular lymph node biopsy after a positive PET scan with diffuse lymphadenopathy.  Findings showed granulomatous inflammation consistent with sarcoidosis  Overall he is doing well, not on active treatment for sarcoidosis He is established with Dr. Elsworth Soho for management of sleep apnea, continues on CPAP Breathing is doing well with no issues  Outpatient Encounter Medications as of 11/22/2020  Medication Sig   Coenzyme Q10 (CO Q 10) 100 MG CAPS Take 2 capsules by mouth daily.   fexofenadine (ALLEGRA) 60 MG tablet Take 60 mg by mouth daily.   guaiFENesin (EXPECTORANT COUGH CONTROL PO) Take 400 mg by mouth 2 (two) times a week. Patient reports 2 or 3 times a week.   metFORMIN (GLUCOPHAGE-XR) 500 MG 24 hr tablet Take 2 tablets (1,000 mg total) by mouth 2 (two) times daily with a meal.   polyethylene glycol (MIRALAX / GLYCOLAX) packet Take 17 g by mouth daily.   predniSONE (STERAPRED UNI-PAK 21 TAB) 10 MG (21) TBPK tablet Take by mouth daily. Take 20mg  with food for 5 days, then take 10mg  with food for 5 days then stop.   sildenafil (VIAGRA) 100 MG tablet Take 1 tablet (100 mg total) by mouth daily as needed for erectile dysfunction.   simvastatin (ZOCOR) 40 MG tablet Take one tablet by mouth once daily in the evening.   Turmeric 500 MG CAPS Take 500 mg by mouth 2 (two) times daily.   Vitamin D, Ergocalciferol, (DRISDOL) 1.25 MG (50000 UNIT) CAPS capsule TAKE 1 CAPSULE ONCE A WEEK.   [  DISCONTINUED] loratadine (CLARITIN) 10 MG tablet Take 10 mg by mouth daily. (Patient not taking: Reported on 11/22/2020)   No facility-administered encounter medications on file as of 11/22/2020.   Physical Exam: Blood pressure 124/76, pulse 88, height 5\' 11"  (1.803 m), weight 198 lb 12.8 oz (90.2 kg), SpO2 96 %. Gen:      No acute distress HEENT:  EOMI, sclera anicteric Neck:     No masses; no thyromegaly Lungs:    Clear to auscultation bilaterally; normal respiratory effort CV:          Regular rate and rhythm; no murmurs Abd:      + bowel sounds; soft, non-tender; no palpable masses, no distension Ext:    No edema; adequate peripheral perfusion Skin:      Warm and dry; no rash Neuro: alert and oriented x 3 Psych: normal mood and affect  Data Reviewed: Imaging: High-resolution CT 10/19/2017-multiple pulmonary nodules PET scan 11/09/2017-pulmonary nodules, lymphadenopathy with supraclavicular, axillary, mediastinal and hilar lymph nodes.  Chest x-ray 09/28/2020-no active cardiopulmonary disease I have reviewed images personally  PFTs: 12/25/2017 FVC 4.50 [96%], FEV1 3.9 [94%], F/F 79, TLC 4.59 [63%], DLCO 31.15 [92%] Moderate restriction  FENO 10/08/2017-16  Labs: CBC 09/13/2017-WBC 6, eos 1%, absolute eosinophil count 60 RAST panel 10/08/2017-negative, IgE 2  [facility]02[facility]7260 Metabolic panel, hepatic panel within normal limits CBC within normal limits Angiotensin-converting enzyme-70 [reference range 9-67]  Pathology: Lymph node biopsy 11/20/2017-granulomatous inflammation, AFB stain negative  Assessment: Sarcoidosis Diagnosed with IR lymph node biopsy in 2019.  He has been on intermittent rounds of prednisone since then.  Not on active treatment Recent chest x-ray does not show any active disease Continue monitoring  Obstructive sleep apnea Continues on CPAP  He can consolidate pulmonary visits with Dr. Elsworth Soho who can manage both sarcoidosis and OSA  Plan/Recommendations: -Continue CPAP, follow-up with Dr. Charlyn Minerva MD [facility] 11/22/2020, 9:53 AM  CC: [name], DO   Addendum: Received fax of split-night sleep study performed on 01/01/2014 at [facility] Severe obstructive sleep apnea with AHI 28.  CPAP titrated to 9 cm water.  No significant desaturation with application of CPAP therapy  Report scanned into the computer.  Marshell Garfinkel MD [facility] 11/29/2020, 4:06 PM

## 2020-11-22 NOTE — Patient Instructions (Signed)
I am glad you are doing well with regard to breathing I have reviewed a chest x-ray which does not show any abnormality  You can follow-up with Dr. Elsworth Soho for both your pulmonary and sleep needs I am happy to see you back in my clinic as needed

## 2020-11-25 ENCOUNTER — Telehealth: Payer: Self-pay | Admitting: Pulmonary Disease

## 2020-11-25 DIAGNOSIS — Z9989 Dependence on other enabling machines and devices: Secondary | ICD-10-CM

## 2020-11-25 DIAGNOSIS — G4733 Obstructive sleep apnea (adult) (pediatric): Secondary | ICD-10-CM

## 2020-11-25 NOTE — Telephone Encounter (Signed)
Left message for patient to call back  

## 2020-11-30 NOTE — Telephone Encounter (Signed)
Called and spoke with patient, advised of recommendations per Dr. Elsworth Soho.  Patient was insistent that we order a new machine given the back log in getting a new machine.  Advised that Dr. Elsworth Soho was ok with [place] ordering a new machine.  Order sent to [facility].  Nothing further needed.

## 2020-11-30 NOTE — Telephone Encounter (Signed)
Called and spoke to pt. Pt states his CPAP gave the error message that it was at the end of its motor life. Pt is requesting a new CPAP. Pt aware of the delay.    Dr. Elsworth Soho, please advise if ok to order replacement CPAP.

## 2020-12-01 ENCOUNTER — Telehealth: Payer: Self-pay | Admitting: Pulmonary Disease

## 2020-12-01 ENCOUNTER — Telehealth (INDEPENDENT_AMBULATORY_CARE_PROVIDER_SITE_OTHER): Payer: Self-pay | Admitting: Pulmonary Disease

## 2020-12-01 NOTE — Telephone Encounter (Signed)
Received sleep study that we had requested from Dr Susie Cassette dated 01/01/14 Placed in Dr Bari Mantis lookat in [place] C pod  Please sign after you review this so that we can get it scanned in, thanks

## 2021-01-10 ENCOUNTER — Other Ambulatory Visit: Payer: Self-pay | Admitting: *Deleted

## 2021-01-10 DIAGNOSIS — E119 Type 2 diabetes mellitus without complications: Secondary | ICD-10-CM

## 2021-01-10 MED ORDER — METFORMIN HCL ER 500 MG PO TB24: 1000.0000 mg | ORAL_TABLET | Freq: Two times a day (BID) | ORAL | 1 refills | Status: DC

## 2021-01-10 MED ORDER — SIMVASTATIN 40 MG PO TABS: ORAL_TABLET | ORAL | 1 refills | Status: DC

## 2021-01-10 NOTE — Addendum Note (Signed)
Addended by: Rafael Bihari A on: 01/10/2021 10:30 AM   Modules accepted: Orders

## 2021-01-10 NOTE — Telephone Encounter (Signed)
[facility] requested refill.

## 2021-01-12 ENCOUNTER — Other Ambulatory Visit: Payer: Self-pay

## 2021-01-12 ENCOUNTER — Other Ambulatory Visit: Payer: Self-pay | Admitting: Internal Medicine

## 2021-01-12 ENCOUNTER — Non-Acute Institutional Stay: Payer: Medicare Other | Admitting: Internal Medicine

## 2021-01-12 DIAGNOSIS — L03011 Cellulitis of right finger: Secondary | ICD-10-CM | POA: Diagnosis not present

## 2021-01-12 MED ORDER — CEPHALEXIN 500 MG PO CAPS: 500.0000 mg | ORAL_CAPSULE | Freq: Three times a day (TID) | ORAL | 0 refills | Status: AC

## 2021-01-12 MED ORDER — VITAMIN D (ERGOCALCIFEROL) 1.25 MG (50000 UNIT) PO CAPS
ORAL_CAPSULE | ORAL | 1 refills | Status: DC
Start: 2021-01-12 — End: 2021-08-03

## 2021-01-12 NOTE — Progress Notes (Signed)
Location: [place] of Service:  Clinic (12)  Provider:   Code Status:  Goals of Care:  Advanced Directives 08/11/2020  Does Patient Have a Medical Advance Directive? Yes  Type of Advance Directive Living will;Healthcare Power of Attorney  Does patient want to make changes to medical advance directive? No - Patient declined  Copy of Robert Norman in Chart? -  Would patient like information on creating a medical advance directive? -     Chief Complaint  Patient presents with   Acute Visit    Walk in. Infection around finger.     HPI: Patient is a 75 y.o. male seen today for an acute visit for Redness in his Second finger of Right hand Patient with history of Diabetes was walk in the clinic for Redness in Finger. Has had it for 1 month Soaking it in [place] and using Bacitracin No Change and now is Worse Mild Pain No discharge or swelling  Past Medical History:  Diagnosis Date   Asthma    Calcific Achilles tendonitis    bilateral   Controlled type 2 diabetes mellitus without complication (HCC)    DDD (degenerative disc disease), cervical    Gallstone 09/11/2011   GERD (gastroesophageal reflux disease)    Hyperlipidemia    mixed   Insomnia    Mild cognitive impairment    Overweight (BMI 25.0-29.9)    Prostate cancer ([place])    Pulmonary nodule/lesion, solitary 06/2004   f/u in 2014, 2015 stable in [facility]   Sleep apnea    Slow transit constipation    Vitamin D deficiency     Past Surgical History:  Procedure Laterality Date   CERVICAL FUSION  1999 & 2004   implant metal plate   CHOLECYSTECTOMY  2012   COLONOSCOPY  12/2015   EYE SURGERY  1997   laser vision correction   palatouvulopasty  1995   PROSTATE SURGERY  09/11/2011   prostatectomy for prostate cancer    TONSILLECTOMY     WISDOM TOOTH EXTRACTION      Allergies  Allergen Reactions   Tetracyclines & Related Other (See Comments)    hepatitis   Niacin And  Related Other (See Comments)    headache   Wellbutrin [Bupropion] Other (See Comments)    dizziness    Outpatient Encounter Medications as of 01/12/2021  Medication Sig   cephALEXin (KEFLEX) 500 MG capsule Take 1 capsule (500 mg total) by mouth 3 (three) times daily for 7 days.   Coenzyme Q10 (CO Q 10) 100 MG CAPS Take 2 capsules by mouth daily.   fexofenadine (ALLEGRA) 60 MG tablet Take 60 mg by mouth daily.   guaiFENesin (EXPECTORANT COUGH CONTROL PO) Take 400 mg by mouth 2 (two) times a week. Patient reports 2 or 3 times a week.   metFORMIN (GLUCOPHAGE-XR) 500 MG 24 hr tablet Take 2 tablets (1,000 mg total) by mouth 2 (two) times daily with a meal.   polyethylene glycol (MIRALAX / GLYCOLAX) packet Take 17 g by mouth daily.   predniSONE (STERAPRED UNI-PAK 21 TAB) 10 MG (21) TBPK tablet Take by mouth daily. Take '20mg'$  with food for 5 days, then take '10mg'$  with food for 5 days then stop.   sildenafil (VIAGRA) 100 MG tablet Take 1 tablet (100 mg total) by mouth daily as needed for erectile dysfunction.   simvastatin (ZOCOR) 40 MG tablet Take one tablet by mouth once daily in the evening.   Turmeric 500 MG  CAPS Take 500 mg by mouth 2 (two) times daily.   Vitamin D, Ergocalciferol, (DRISDOL) 1.25 MG (50000 UNIT) CAPS capsule TAKE 1 CAPSULE ONCE A WEEK.   No facility-administered encounter medications on file as of 01/12/2021.    Review of Systems:  Review of Systems  Constitutional:  Negative for fever.  HENT: Negative.    Respiratory: Negative.    Cardiovascular: Negative.   Gastrointestinal: Negative.   Musculoskeletal: Negative.   Skin:  Positive for color change.  Neurological: Negative.   Psychiatric/Behavioral: Negative.     Health Maintenance  Topic Date Due   OPHTHALMOLOGY EXAM  06/09/2020   COVID-19 Vaccine (4 - Booster) 07/14/2020   URINE MICROALBUMIN  08/06/2020   INFLUENZA VACCINE  12/27/2020   HEMOGLOBIN A1C  02/05/2021   FOOT EXAM  04/14/2021   TETANUS/TDAP   05/07/2027   COLONOSCOPY (Pts 45-21yr Insurance coverage will need to be confirmed)  12/16/2028   Hepatitis C Screening  Completed   PNA vac Low Risk Adult  Completed   Zoster Vaccines- Shingrix  Completed   HPV VACCINES  Aged Out    Physical Exam: There were no vitals filed for this visit. There is no height or weight on file to calculate BMI. Physical Exam Constitutional:      Appearance: Normal appearance.  HENT:     Head: Normocephalic.     Nose: Nose normal.     Mouth/Throat:     Mouth: Mucous membranes are moist.     Pharynx: Oropharynx is clear.  Musculoskeletal:     Comments: Right 2nd Finger had Redness and some tenderness near the Nail with no signs of Abscess or swellinh  Neurological:     Mental Status: He is alert.    Labs reviewed: Basic Metabolic Panel: Recent Labs    04/08/20 0000 08/05/20 0000  NA 139 139  K 4.4 4.9  CL 100 102  CO2 25* 26*  BUN 14 15  CREATININE 0.9 1.0  CALCIUM 10.2 9.8   Liver Function Tests: No results for input(s): AST, ALT, ALKPHOS, BILITOT, PROT, ALBUMIN in the last 8760 hours. No results for input(s): LIPASE, AMYLASE in the last 8760 hours. No results for input(s): AMMONIA in the last 8760 hours. CBC: No results for input(s): WBC, NEUTROABS, HGB, HCT, MCV, PLT in the last 8760 hours. Lipid Panel: Recent Labs    08/05/20 0000  CHOL 107  HDL 47  LDLCALC 49  TRIG 57   Lab Results  Component Value Date   HGBA1C 5.8 08/05/2020    Procedures since last visit: No results found.  Assessment/Plan 1. 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

## 2021-01-12 NOTE — Telephone Encounter (Signed)
Patient pharmacy "[facility]" has requested refill for Vitamin D2 through fax. Medication sent into pharmacy.

## 2021-01-21 ENCOUNTER — Telehealth: Payer: Self-pay

## 2021-01-26 ENCOUNTER — Telehealth: Payer: Self-pay

## 2021-01-26 NOTE — Telephone Encounter (Addendum)
Clinic walk in patient states he has completed antibiotic and infection in finger is still not better. Patient does not currently have a working phone.   Hearther RN, would like the finger looked at.   To Dr. Lyndel Safe

## 2021-01-27 NOTE — Telephone Encounter (Signed)
Was treated For Acute Paronychia with Keflex Came for follow up. Finger Looks Much better Some redness in around the nail but not Tender. I have told him to just use OTC hydrocortisone till he comes for his Follow up in few weeks

## 2021-02-08 NOTE — Telephone Encounter (Signed)
No documentation or message. Open in error

## 2021-02-10 ENCOUNTER — Encounter: Payer: Self-pay | Admitting: Internal Medicine

## 2021-02-10 DIAGNOSIS — E782 Mixed hyperlipidemia: Secondary | ICD-10-CM | POA: Diagnosis not present

## 2021-02-10 DIAGNOSIS — E1169 Type 2 diabetes mellitus with other specified complication: Secondary | ICD-10-CM | POA: Diagnosis not present

## 2021-02-10 LAB — HEMOGLOBIN A1C: Hemoglobin A1C: 6.7

## 2021-02-10 LAB — BASIC METABOLIC PANEL
BUN: 11 (ref 4–21)
CO2: 27 — AB (ref 13–22)
Chloride: 102 (ref 99–108)
Creatinine: 1.1 (ref 0.6–1.3)
Glucose: 168
Potassium: 4.8 (ref 3.4–5.3)
Sodium: 141 (ref 137–147)

## 2021-02-10 LAB — CBC AND DIFFERENTIAL
HCT: 43 (ref 41–53)
Hemoglobin: 14.4 (ref 13.5–17.5)
Platelets: 144 — AB (ref 150–399)
WBC: 6

## 2021-02-10 LAB — COMPREHENSIVE METABOLIC PANEL
Albumin: 4.8 (ref 3.5–5.0)
Calcium: 9.8 (ref 8.7–10.7)
Globulin: 2.4

## 2021-02-10 LAB — HEPATIC FUNCTION PANEL
ALT: 23 (ref 10–40)
AST: 18 (ref 14–40)
Alkaline Phosphatase: 59 (ref 25–125)
Bilirubin, Total: 0.7

## 2021-02-10 LAB — CBC: RBC: 4.77 (ref 3.87–5.11)

## 2021-02-16 ENCOUNTER — Encounter: Payer: Self-pay | Admitting: Internal Medicine

## 2021-02-16 ENCOUNTER — Other Ambulatory Visit: Payer: Self-pay

## 2021-02-16 ENCOUNTER — Non-Acute Institutional Stay: Payer: Medicare Other | Admitting: Internal Medicine

## 2021-02-16 VITALS — BP 145/80 | HR 74 | Temp 96.9°F | Ht 71.0 in | Wt 209.8 lb

## 2021-02-16 DIAGNOSIS — N529 Male erectile dysfunction, unspecified: Secondary | ICD-10-CM | POA: Diagnosis not present

## 2021-02-16 DIAGNOSIS — E782 Mixed hyperlipidemia: Secondary | ICD-10-CM | POA: Diagnosis not present

## 2021-02-16 DIAGNOSIS — E1169 Type 2 diabetes mellitus with other specified complication: Secondary | ICD-10-CM | POA: Diagnosis not present

## 2021-02-16 DIAGNOSIS — D869 Sarcoidosis, unspecified: Secondary | ICD-10-CM

## 2021-02-16 DIAGNOSIS — K5909 Other constipation: Secondary | ICD-10-CM

## 2021-02-16 DIAGNOSIS — G4730 Sleep apnea, unspecified: Secondary | ICD-10-CM | POA: Diagnosis not present

## 2021-02-16 NOTE — Progress Notes (Signed)
Location:  [place] of Service:  Clinic (12)  Provider:   Code Status:  Goals of Care:  Advanced Directives 08/11/2020  Does Patient Have a Medical Advance Directive? Yes  Type of Advance Directive Living will;Healthcare Power of Attorney  Does patient want to make changes to medical advance directive? No - Patient declined  Copy of [place] in Chart? -  Would patient like information on creating a medical advance directive? -     Chief Complaint  Patient presents with   Medical Management of Chronic Issues    Patient returns to the clinic for his 6 month follow up.    Quality Metric Gaps    Eye exam, #4 covid,     HPI: Patient is a 75 y.o. male seen today for medical management of chronic diseases.    Patient has h/o Diabetes Mellitus type 2  Sarcoidosis Diagnosed with Lymph node Biopsy Not on Active treatment Follows with Pulmonology Does have Prednisone Prescription PRN if needed H/o Sleep Apnea on CPAP  Did have some cough and Congestion today says got exposed to someone with Cold virus He says he has tested himself is negative for Covid No Fever  Very active otherwise Walks everyday. No falls Has gained weight as eating more in [place]    Past Medical History:  Diagnosis Date   Asthma    Calcific Achilles tendonitis    bilateral   Controlled type 2 diabetes mellitus without complication (HCC)    DDD (degenerative disc disease), cervical    Gallstone 09/11/2011   GERD (gastroesophageal reflux disease)    Hyperlipidemia    mixed   Insomnia    Mild cognitive impairment    Overweight (BMI 25.0-29.9)    Prostate cancer ([place])    Pulmonary nodule/lesion, solitary 06/2004   f/u in 2014, 2015 stable in [facility]   Sleep apnea    Slow transit constipation    Vitamin D deficiency     Past Surgical History:  Procedure Laterality Date   CERVICAL FUSION  1999 & 2004   implant metal plate   CHOLECYSTECTOMY   2012   COLONOSCOPY  12/2015   EYE SURGERY  1997   laser vision correction   palatouvulopasty  1995   PROSTATE SURGERY  09/11/2011   prostatectomy for prostate cancer    TONSILLECTOMY     WISDOM TOOTH EXTRACTION      Allergies  Allergen Reactions   Tetracyclines & Related Other (See Comments)    hepatitis   Niacin And Related Other (See Comments)    headache   Wellbutrin [Bupropion] Other (See Comments)    dizziness    Outpatient Encounter Medications as of 02/16/2021  Medication Sig   b complex vitamins capsule Take 1 capsule by mouth daily.   Coenzyme Q10 (CO Q 10) 100 MG CAPS Take 2 capsules by mouth daily.   fexofenadine (ALLEGRA) 60 MG tablet Take 60 mg by mouth daily.   guaiFENesin (EXPECTORANT COUGH CONTROL PO) Take 400 mg by mouth 2 (two) times a week. Patient reports 2 or 3 times a week.   metFORMIN (GLUCOPHAGE-XR) 500 MG 24 hr tablet Take 2 tablets (1,000 mg total) by mouth 2 (two) times daily with a meal.   polyethylene glycol (MIRALAX / GLYCOLAX) packet Take 17 g by mouth daily.   sildenafil (VIAGRA) 100 MG tablet Take 1 tablet (100 mg total) by mouth daily as needed for erectile dysfunction.   simvastatin (ZOCOR) 40  MG tablet Take one tablet by mouth once daily in the evening.   Turmeric 500 MG CAPS Take 500 mg by mouth 2 (two) times daily.   Vitamin D, Ergocalciferol, (DRISDOL) 1.25 MG (50000 UNIT) CAPS capsule TAKE 1 CAPSULE ONCE A WEEK.   [DISCONTINUED] predniSONE (STERAPRED UNI-PAK 21 TAB) 10 MG (21) TBPK tablet Take by mouth daily. Take 20mg  with food for 5 days, then take 10mg  with food for 5 days then stop.   No facility-administered encounter medications on file as of 02/16/2021.    Review of Systems:  Review of Systems Review of Systems  Constitutional: Negative for activity change, appetite change, chills, diaphoresis, fatigue and fever.  HENT: Negative for mouth sores, postnasal drip, rhinorrhea, sinus pain and sore throat.   Respiratory: Negative for  apnea, cough, chest tightness, shortness of breath and wheezing.   Cardiovascular: Negative for chest pain, palpitations and leg swelling.  Gastrointestinal: Negative for abdominal distention, abdominal pain, constipation, diarrhea, nausea and vomiting.  Genitourinary: Negative for dysuria and frequency.  Musculoskeletal: Negative for arthralgias, joint swelling and myalgias.  Skin: Negative for rash.  Neurological: Negative for dizziness, syncope, weakness, light-headedness and numbness.  Psychiatric/Behavioral: Negative for behavioral problems, confusion and sleep disturbance.   Health Maintenance  Topic Date Due   OPHTHALMOLOGY EXAM  06/09/2020   COVID-19 Vaccine (4 - Booster) 07/06/2020   URINE MICROALBUMIN  08/06/2020   INFLUENZA VACCINE  12/27/2020   FOOT EXAM  04/14/2021   HEMOGLOBIN A1C  08/10/2021   TETANUS/TDAP  05/07/2027   COLONOSCOPY (Pts 45-43yrs Insurance coverage will need to be confirmed)  12/16/2028   Hepatitis C Screening  Completed   Zoster Vaccines- Shingrix  Completed   HPV VACCINES  Aged Out    Physical Exam: Vitals:   02/16/21 0924  BP: (!) 145/80  Pulse: 74  Temp: (!) 96.9 F (36.1 C)  SpO2: 97%  Weight: 209 lb 12.8 oz (95.2 kg)  Height: 5\' 11"  (1.803 m)   Body mass index is 29.26 kg/m. Physical Exam Constitutional: Oriented to person, place, and time. Well-developed and well-nourished.  HENT:  Head: Normocephalic.  Mouth/Throat: Oropharynx is clear and moist.  Eyes: Pupils are equal, round, and reactive to light.  Ears Wears Hearing aid Some wax in right ear but not impacted Neck: Neck supple.  Cardiovascular: Normal rate and normal heart sounds.  No murmur heard. Pulmonary/Chest: Effort normal and breath sounds normal. No respiratory distress. No wheezes. Has no rales.  Abdominal: Soft. Bowel sounds are normal. No distension. There is no tenderness. There is no rebound.  Musculoskeletal: No edema.  Lymphadenopathy: none Neurological:  Alert and oriented to person, place, and time.  Skin: Skin is warm and dry.  Psychiatric: Normal mood and affect. Behavior is normal. Thought content normal.   Labs reviewed: Basic Metabolic Panel: Recent Labs    04/08/20 0000 08/05/20 0000 02/10/21 0000  NA 139 139 141  K 4.4 4.9 4.8  CL 100 102 102  CO2 25* 26* 27*  BUN 14 15 11   CREATININE 0.9 1.0 1.1  CALCIUM 10.2 9.8 9.8   Liver Function Tests: Recent Labs    02/10/21 0000  AST 18  ALT 23  ALKPHOS 59  ALBUMIN 4.8   No results for input(s): LIPASE, AMYLASE in the last 8760 hours. No results for input(s): AMMONIA in the last 8760 hours. CBC: Recent Labs    02/10/21 0000  WBC 6.0  HGB 14.4  HCT 43  PLT 144*   Lipid Panel: Recent  Labs    08/05/20 0000  CHOL 107  HDL 47  LDLCALC 49  TRIG 57   Lab Results  Component Value Date   HGBA1C 6.7 02/10/2021    Procedures since last visit: No results found.  Assessment/Plan Controlled type 2 diabetes mellitus with other specified complication, without long-term current use of insulin (HCC) A1C is 6.7 Talked about diet and exercise and to work to loose weight Likes his weight to be around 200 lBS Follows with Opthalmology Mixed hyperlipidemia LDL less then 100 in 3/22 Sleep apnea, unspecified type Wears his CPAP Vasculogenic erectile dysfunction,  On Viagra PRN Chronic constipation Miralax Sarcoidosis Follows with Pulmonary Mildly Elevated BP He will monitor at home PRN High doses of Vit D Will check the level    Labs/tests ordered:  CBC,CMP,D level, TSH Next appt:  04/05/2021

## 2021-03-11 ENCOUNTER — Telehealth: Payer: Self-pay | Admitting: Pulmonary Disease

## 2021-03-11 NOTE — Telephone Encounter (Signed)
Received CPAP set up confirmation from [place] note updated   Pt needs f/u with [place] 04/04/21- 06/02/21  Called pt to make appt and had to [facility] [place]

## 2021-03-22 DIAGNOSIS — Z23 Encounter for immunization: Secondary | ICD-10-CM | POA: Diagnosis not present

## 2021-03-25 DIAGNOSIS — L821 Other seborrheic keratosis: Secondary | ICD-10-CM | POA: Diagnosis not present

## 2021-03-25 DIAGNOSIS — D0430 Carcinoma in situ of skin of unspecified part of face: Secondary | ICD-10-CM | POA: Diagnosis not present

## 2021-03-25 DIAGNOSIS — D045 Carcinoma in situ of skin of trunk: Secondary | ICD-10-CM | POA: Diagnosis not present

## 2021-03-25 DIAGNOSIS — D0439 Carcinoma in situ of skin of other parts of face: Secondary | ICD-10-CM | POA: Diagnosis not present

## 2021-03-25 DIAGNOSIS — D1801 Hemangioma of skin and subcutaneous tissue: Secondary | ICD-10-CM | POA: Diagnosis not present

## 2021-04-01 DIAGNOSIS — Z4800 Encounter for change or removal of nonsurgical wound dressing: Secondary | ICD-10-CM | POA: Diagnosis not present

## 2021-04-01 DIAGNOSIS — D098 Carcinoma in situ of other specified sites: Secondary | ICD-10-CM | POA: Diagnosis not present

## 2021-04-04 ENCOUNTER — Encounter: Payer: Medicare Other | Admitting: Internal Medicine

## 2021-04-05 ENCOUNTER — Other Ambulatory Visit: Payer: Self-pay

## 2021-04-05 ENCOUNTER — Encounter: Payer: Self-pay | Admitting: Family

## 2021-04-05 ENCOUNTER — Ambulatory Visit (INDEPENDENT_AMBULATORY_CARE_PROVIDER_SITE_OTHER): Payer: Medicare Other | Admitting: Family

## 2021-04-05 DIAGNOSIS — Z0000 Encounter for general adult medical examination without abnormal findings: Secondary | ICD-10-CM

## 2021-04-05 NOTE — Progress Notes (Signed)
This service is provided via telemedicine  No vital signs collected/recorded due to the encounter was a telemedicine visit.   Location of patient (ex: home, work):  Home.  Patient consents to a telephone visit:  Yes  Location of the provider (ex: office, home):  [employer].   Name of any referring provider:  Virgie Dad, MD   Names of all persons participating in the telemedicine service and their role in the encounter:  Patient, Robert Norman, [name], [name], Robert Silversmith, NP.    Time spent on call: 8 minutes spent on the phone with Medical Assistant.    Provider: Seferina Brokaw FNP-C  Virgie Dad, MD  Patient Care Team: Virgie Dad, MD as PCP - General (Internal Medicine) Christain [place], [place] as Referring Physician (Optometry)  Extended Emergency Contact Information Primary Emergency Contact: [facility] Mobile Phone: [phone] Relation: Son Secondary Emergency Contact: Cecelia Byars Address: [address]          [address]          [place], [place] [address] Johnnette Litter of [employer] Phone: [phone] Relation: Son  Code Status:  DNR Goals of care: Advanced Directive information Advanced Directives 04/05/2021  Does Patient Have a Medical Advance Directive? Yes  Type of Advance Directive [place]  Does patient want to make changes to medical advance directive? No - Patient declined  Copy of [place] in Chart? Yes - validated most recent copy scanned in chart (See row information)  Would patient like information on creating a medical advance directive? -     Chief Complaint  Patient presents with   Medicare Wellness    Annual Wellness Visit.   Health Maintenance    Discuss the need for Eye exam.    Immunizations    Discuss the need for 2nd Covid Booster, and Influenza Vaccine.    HPI:  Pt is a 75 y.o. male seen today for an acute visit for evaluation of    Past Medical History:   Diagnosis Date   Asthma    Calcific Achilles tendonitis    bilateral   Controlled type 2 diabetes mellitus without complication (HCC)    DDD (degenerative disc disease), cervical    Gallstone 09/11/2011   GERD (gastroesophageal reflux disease)    Hyperlipidemia    mixed   Insomnia    Mild cognitive impairment    Overweight (BMI 25.0-29.9)    Prostate cancer ([place])    Pulmonary nodule/lesion, solitary 06/2004   f/u in 2014, 2015 stable in [facility]   Sleep apnea    Slow transit constipation    Vitamin D deficiency    Past Surgical History:  Procedure Laterality Date   CERVICAL FUSION  1999 & 2004   implant metal plate   CHOLECYSTECTOMY  2012   COLONOSCOPY  12/2015   EYE SURGERY  1997   laser vision correction   palatouvulopasty  1995   PROSTATE SURGERY  09/11/2011   prostatectomy for prostate cancer    TONSILLECTOMY     WISDOM TOOTH EXTRACTION      Allergies  Allergen Reactions   Tetracyclines & Related Other (See Comments)    hepatitis   Niacin And Related Other (See Comments)    headache   Wellbutrin [Bupropion] Other (See Comments)    dizziness    Outpatient Encounter Medications as of 04/05/2021  Medication Sig   b complex vitamins capsule Take 1 capsule by mouth daily.   Coenzyme  Q10 (CO Q 10) 100 MG CAPS Take 2 capsules by mouth daily.   fexofenadine (ALLEGRA) 60 MG tablet Take 60 mg by mouth daily.   metFORMIN (GLUCOPHAGE-XR) 500 MG 24 hr tablet Take 2 tablets (1,000 mg total) by mouth 2 (two) times daily with a meal.   polyethylene glycol (MIRALAX / GLYCOLAX) packet Take 17 g by mouth daily.   sildenafil (VIAGRA) 100 MG tablet Take 1 tablet (100 mg total) by mouth daily as needed for erectile dysfunction.   simvastatin (ZOCOR) 40 MG tablet Take one tablet by mouth once daily in the evening.   Turmeric 500 MG CAPS Take 500 mg by mouth 2 (two) times daily.   Vitamin D, Ergocalciferol, (DRISDOL) 1.25 MG (50000 UNIT) CAPS capsule TAKE 1 CAPSULE ONCE A WEEK.    [DISCONTINUED] guaiFENesin (EXPECTORANT COUGH CONTROL PO) Take 400 mg by mouth 2 (two) times a week. Patient reports 2 or 3 times a week.   No facility-administered encounter medications on file as of 04/05/2021.    Review of Systems  Immunization History  Administered Date(s) Administered   Influenza, High Dose Seasonal PF 03/04/2018, 03/07/2019, 03/04/2020, 04/13/2020, 03/23/2021   Influenza-Unspecified 03/12/2014, 03/02/2016, 02/26/2017   Moderna Sars-Covid-2 Vaccination 04/13/2020   PFIZER(Purple Top)SARS-COV-2 Vaccination 05/31/2019, 06/18/2019   Pneumococcal Conjugate-13 08/04/2016   Pneumococcal Polysaccharide-23 04/05/2010, 04/17/2019   Td 05/06/2017   Tdap 03/12/2014   Zoster Recombinat (Shingrix) 02/26/2017, 03/29/2017   Zoster, Live 07/19/2007   Pertinent  Health Maintenance Due  Topic Date Due   OPHTHALMOLOGY EXAM  06/09/2020   FOOT EXAM  04/14/2021   HEMOGLOBIN A1C  08/10/2021   URINE MICROALBUMIN  02/10/2022   COLONOSCOPY (Pts 45-54yrs Insurance coverage will need to be confirmed)  12/16/2028   INFLUENZA VACCINE  Completed   Fall Risk 08/13/2019 04/02/2020 04/14/2020 02/16/2021 04/05/2021  Falls in the past year? 0 0 0 0 0  Was there an injury with Fall? 0 0 0 - 0  Fall Risk Category Calculator 0 0 0 - 0  Fall Risk Category Low Low Low - Low  Patient Fall Risk Level Low fall risk Low fall risk Low fall risk Low fall risk Low fall risk  Patient at Risk for Falls Due to - - - - No Fall Risks  Fall risk Follow up - - - Falls evaluation completed Falls evaluation completed   Functional Status Survey: Is the patient deaf or have difficulty hearing?: Yes (chronic Hard  hearing) Does the patient have difficulty seeing, even when wearing glasses/contacts?: No Does the patient have difficulty concentrating, remembering, or making decisions?: No Does the patient have difficulty walking or climbing stairs?: No Does the patient have difficulty dressing or bathing?: No Does  the patient have difficulty doing errands alone such as visiting a doctor's office or shopping?: No  There were no vitals filed for this visit. There is no height or weight on file to calculate BMI. Physical Exam  Labs reviewed: Recent Labs    04/08/20 0000 08/05/20 0000 02/10/21 0000  NA 139 139 141  K 4.4 4.9 4.8  CL 100 102 102  CO2 25* 26* 27*  BUN 14 15 11   CREATININE 0.9 1.0 1.1  CALCIUM 10.2 9.8 9.8   Recent Labs    02/10/21 0000  AST 18  ALT 23  ALKPHOS 59  ALBUMIN 4.8   Recent Labs    02/10/21 0000  WBC 6.0  HGB 14.4  HCT 43  PLT 144*   Lab Results  Component Value  Date   TSH 2.40 11/07/2016   Lab Results  Component Value Date   HGBA1C 6.7 02/10/2021   Lab Results  Component Value Date   CHOL 107 08/05/2020   HDL 47 08/05/2020   LDLCALC 49 08/05/2020   TRIG 57 08/05/2020    Significant Diagnostic Results in last 30 days:  No results found.  Assessment/Plan There are no diagnoses linked to this encounter.   Family/ staff Communication: Reviewed plan of care with patient  Labs/tests ordered: None   Next Appointment:   Sandrea Hughs, NP   Subjective:   Robert Norman is a 75 y.o. male who presents for Medicare Annual/Subsequent preventive examination.  Review of Systems     Cardiac Risk Factors include: advanced age (>53men, >14 women);male gender     Objective:    There were no vitals filed for this visit. There is no height or weight on file to calculate BMI.  Advanced Directives 04/05/2021 08/11/2020 04/14/2020 04/02/2020 08/13/2019 04/02/2019 02/12/2019  Does Patient Have a Medical Advance Directive? Yes Yes Yes Yes Yes Yes Yes  Type of Academic librarian Living will;Healthcare Power of [place] of [place] of [place];Living will Healthcare Power of [place]  Does patient want to make changes to medical  advance directive? No - Patient declined No - Patient declined No - Patient declined No - Patient declined No - Patient declined No - Patient declined No - Patient declined  Copy of [place] in Chart? Yes - validated most recent copy scanned in chart (See row information) - - Yes - validated most recent copy scanned in chart (See row information) Yes - validated most recent copy scanned in chart (See row information) Yes - validated most recent copy scanned in chart (See row information) Yes - validated most recent copy scanned in chart (See row information)  Would patient like information on creating a medical advance directive? - - - - - - -    Current Medications (verified) Outpatient Encounter Medications as of 04/05/2021  Medication Sig   b complex vitamins capsule Take 1 capsule by mouth daily.   Coenzyme Q10 (CO Q 10) 100 MG CAPS Take 2 capsules by mouth daily.   fexofenadine (ALLEGRA) 60 MG tablet Take 60 mg by mouth daily.   metFORMIN (GLUCOPHAGE-XR) 500 MG 24 hr tablet Take 2 tablets (1,000 mg total) by mouth 2 (two) times daily with a meal.   polyethylene glycol (MIRALAX / GLYCOLAX) packet Take 17 g by mouth daily.   sildenafil (VIAGRA) 100 MG tablet Take 1 tablet (100 mg total) by mouth daily as needed for erectile dysfunction.   simvastatin (ZOCOR) 40 MG tablet Take one tablet by mouth once daily in the evening.   Turmeric 500 MG CAPS Take 500 mg by mouth 2 (two) times daily.   Vitamin D, Ergocalciferol, (DRISDOL) 1.25 MG (50000 UNIT) CAPS capsule TAKE 1 CAPSULE ONCE A WEEK.   [DISCONTINUED] guaiFENesin (EXPECTORANT COUGH CONTROL PO) Take 400 mg by mouth 2 (two) times a week. Patient reports 2 or 3 times a week.   No facility-administered encounter medications on file as of 04/05/2021.    Allergies (verified) Tetracyclines & related, Niacin and related, and Wellbutrin [bupropion]   History: Past Medical History:  Diagnosis Date   Asthma    Calcific Achilles  tendonitis    bilateral   Controlled type 2 diabetes mellitus without complication ([place])  DDD (degenerative disc disease), cervical    Gallstone 09/11/2011   GERD (gastroesophageal reflux disease)    Hyperlipidemia    mixed   Insomnia    Mild cognitive impairment    Overweight (BMI 25.0-29.9)    Prostate cancer ([place])    Pulmonary nodule/lesion, solitary 06/2004   f/u in 2014, 2015 stable in [facility]   Sleep apnea    Slow transit constipation    Vitamin D deficiency    Past Surgical History:  Procedure Laterality Date   CERVICAL FUSION  1999 & 2004   implant metal plate   CHOLECYSTECTOMY  2012   COLONOSCOPY  12/2015   EYE SURGERY  1997   laser vision correction   palatouvulopasty  1995   PROSTATE SURGERY  09/11/2011   prostatectomy for prostate cancer    TONSILLECTOMY     WISDOM TOOTH EXTRACTION     Family History  Problem Relation Age of Onset   Cancer Mother    Heart disease Father    Heart disease Brother    Diabetes Brother    Colon cancer Neg Hx    Rectal cancer Neg Hx    Stomach cancer Neg Hx    Esophageal cancer Neg Hx    Social History   Socioeconomic History   Marital status: Widowed    Spouse name: Not on file   Number of children: Not on file   Years of education: Not on file   Highest education level: Not on file  Occupational History   Occupation: accounting    Comment: Agricultural consultant  Tobacco Use   Smoking status: Never   Smokeless tobacco: Never  Vaping Use   Vaping Use: Never used  Substance and Sexual Activity   Alcohol use: Not Currently   Drug use: No   Sexual activity: Yes  Other Topics Concern   Not on file  Social History Narrative   Tobacco use, amount per day now: NEVER USED   Past tobacco use, amount per day: NEVER USED   How many years did you use tobacco: NONE   Alcohol use (drinks per week): 10 GLASSES OF WINE A WEEK   Diet:   Do you drink/eat things with caffeine: YES   Marital status:  WIDOWED                     What year were you married?   Do you live in a house, apartment, assisted living, condo, trailer, etc.? APARTMENT   Is it one or more stories? ONE STORY   How many persons live in your home? NONE   Do you have pets in your home?( please list) NO   Current or past profession: FINANCIAL   Do you exercise?  YES                                Type and how often? WALKING 1-2 HOURS   Do you have a living will? YES   Do you have a DNR form?    NO                               If not, do you want to discuss one?   Do you have signed POA/HPOA forms?   YES  If so, please bring to you appointment   Social Determinants of Health   Financial Resource Strain: Not on file  Food Insecurity: Not on file  Transportation Needs: Not on file  Physical Activity: Not on file  Stress: Not on file  Social Connections: Not on file    Tobacco Counseling Counseling given: Not Answered   Clinical Intake:  Pre-visit preparation completed: No  Pain : No/denies pain     BMI - recorded: 29.26 Nutritional Status: BMI 25 -29 Overweight Nutritional Risks: None Diabetes: Yes CBG done?: No (does not check on a regular basis) Did pt. bring in CBG monitor from home?: No  How often do you need to have someone help you when you read instructions, pamphlets, or other written materials from your doctor or pharmacy?: 1 - Never What is the last grade level you completed in school?: 16 yrs  Diabetic?Yes   Interpreter Needed?: No  Information entered by :: Swetha Rayle,FNP-C   Activities of Daily Living In your present state of health, do you have any difficulty performing the following activities: 04/05/2021  Hearing? Y  Comment chronic Hard  hearing  Vision? N  Difficulty concentrating or making decisions? N  Walking or climbing stairs? N  Dressing or bathing? N  Doing errands, shopping? N  Preparing Food and eating ? N  Using the Toilet? N  In the past six months, have you  accidently leaked urine? N  Do you have problems with loss of bowel control? N  Managing your Medications? N  Managing your Finances? N  Housekeeping or managing your Housekeeping? N  Some recent data might be hidden    Patient Care Team: Virgie Dad, MD as PCP - General (Internal Medicine) Christain [place], [place] as Referring Physician (Optometry)  Indicate any recent Medical Services you may have received from other than [facility] providers in the past year (date may be approximate).     Assessment:   This is a routine wellness examination for [place].  Hearing/Vision screen Hearing Screening - Comments:: No Hearing Concerns. Patient wears hearing aids in both ears.  Vision Screening - Comments:: No Vision Concerns. Patient last eye exam was April 2022. Patient wears reading glasses.   Dietary issues and exercise activities discussed: Current Exercise Habits: Home exercise routine, Type of exercise: walking, Time (Minutes): 60, Frequency (Times/Week): 5, Weekly Exercise (Minutes/Week): 300, Intensity: Moderate, Exercise limited by: Other - see comments (lower back issues)   Goals Addressed   None    Depression Screen PHQ 2/9 Scores 04/05/2021 04/14/2020 04/02/2020 08/13/2019 04/02/2019 10/09/2018 06/12/2018  PHQ - 2 Score 0 0 0 0 0 0 0    Fall Risk Fall Risk  04/05/2021 02/16/2021 04/14/2020 04/02/2020 08/13/2019  Falls in the past year? 0 0 0 0 0  Number falls in past yr: 0 0 0 0 0  Injury with Fall? 0 - 0 0 0  Risk for fall due to : No Fall Risks - - - -  Follow up Falls evaluation completed Falls evaluation completed - - -    FALL RISK PREVENTION PERTAINING TO THE HOME:  Any stairs in or around the home? No  If so, are there any without handrails? No  Home free of loose throw rugs in walkways, pet beds, electrical cords, etc? No  Adequate lighting in your home to reduce risk of falls? Yes   ASSISTIVE DEVICES UTILIZED TO PREVENT FALLS:  Life alert? No  Use of a cane, walker or  w/c? No  Grab bars in the bathroom? Yes  Shower chair or bench in shower? Yes  Elevated toilet seat or a handicapped toilet? Yes   TIMED UP AND GO:  Was the test performed? No .  Length of time to ambulate 10 feet: N/A  sec.   Gait steady and fast without use of assistive device  Cognitive Function: MMSE - Mini Mental State Exam 12/04/2017  Orientation to time 4  Orientation to Place 5  Registration 3  Attention/ Calculation 5  Recall 2  Language- name 2 objects 2  Language- repeat 1  Language- follow 3 step command 3  Language- read & follow direction 1  Write a sentence 1  Copy design 1  Total score 28     6CIT Screen 04/05/2021 04/02/2020 04/02/2019  What Year? 0 points 0 points 0 points  What month? 0 points 0 points 0 points  What time? 0 points 0 points 0 points  Count back from 20 0 points 4 points 0 points  Months in reverse 0 points 0 points 0 points  Repeat phrase 0 points 0 points 4 points  Total Score 0 4 4    Immunizations Immunization History  Administered Date(s) Administered   Influenza, High Dose Seasonal PF 03/04/2018, 03/07/2019, 03/04/2020, 04/13/2020, 03/23/2021   Influenza-Unspecified 03/12/2014, 03/02/2016, 02/26/2017   Moderna Sars-Covid-2 Vaccination 04/13/2020   PFIZER(Purple Top)SARS-COV-2 Vaccination 05/31/2019, 06/18/2019   Pneumococcal Conjugate-13 08/04/2016   Pneumococcal Polysaccharide-23 04/05/2010, 04/17/2019   Td 05/06/2017   Tdap 03/12/2014   Zoster Recombinat (Shingrix) 02/26/2017, 03/29/2017   Zoster, Live 07/19/2007    TDAP status: Up to date  Flu Vaccine status: Up to date  Pneumococcal vaccine status: Up to date  Covid-19 vaccine status: Information provided on how to obtain vaccines.   Qualifies for Shingles Vaccine? Yes   Zostavax completed Yes   Shingrix Completed?: Yes  Screening Tests Health Maintenance  Topic Date Due   COVID-19 Vaccine (4 - Booster) 06/08/2020   OPHTHALMOLOGY EXAM  06/09/2020   FOOT EXAM   04/14/2021   HEMOGLOBIN A1C  08/10/2021   URINE MICROALBUMIN  02/10/2022   TETANUS/TDAP  05/07/2027   COLONOSCOPY (Pts 45-52yrs Insurance coverage will need to be confirmed)  12/16/2028   Pneumonia Vaccine 48+ Years old  Completed   INFLUENZA VACCINE  Completed   Hepatitis C Screening  Completed   Zoster Vaccines- Shingrix  Completed   HPV VACCINES  Aged Out    Health Maintenance  Health Maintenance Due  Topic Date Due   COVID-19 Vaccine (4 - Booster) 06/08/2020   OPHTHALMOLOGY EXAM  06/09/2020    Colorectal cancer screening: No longer required.   Lung Cancer Screening: (Low Dose CT Chest recommended if Age 57-80 years, 30 pack-year currently smoking OR have quit w/in 15years.) does not qualify.   Lung Cancer Screening Referral: No   Additional Screening:  Hepatitis C Screening: does not qualify; Completed Yes   Vision Screening: Recommended annual ophthalmology exams for early detection of glaucoma and other disorders of the eye. Is the patient up to date with their annual eye exam?  Yes  Who is the provider or what is the name of the office in which the patient attends annual eye exams? Dr.Moore  If pt is not established with a provider, would they like to be referred to a provider to establish care? No .   Dental Screening: Recommended annual dental exams for proper oral hygiene  Community Resource Referral / Chronic Care Management: CRR required this visit?  No   CCM required this visit?  No      Plan:     I have personally reviewed and noted the following in the patient's chart:   Medical and social history Use of alcohol, tobacco or illicit drugs  Current medications and supplements including opioid prescriptions. Patient is not currently taking opioid prescriptions. Functional ability and status Nutritional status Physical activity Advanced directives List of other physicians Hospitalizations, surgeries, and ER visits in previous 12  months Vitals Screenings to include cognitive, depression, and falls Referrals and appointments  In addition, I have reviewed and discussed with patient certain preventive protocols, quality metrics, and best practice recommendations. A written personalized care plan for preventive services as well as general preventive health recommendations were provided to patient.     Sandrea Hughs, NP   04/05/2021   Nurse Notes: Advised to get 5 th COVID-19 vaccine at his pharmacy.

## 2021-04-05 NOTE — Patient Instructions (Signed)
[name] , Thank you for taking time to come for your Medicare Wellness Visit. I appreciate your ongoing commitment to your health goals. Please review the following plan we discussed and let me know if I can assist you in the future.   Screening recommendations/referrals: Colonoscopy: N/A  Recommended yearly ophthalmology/optometry visit for glaucoma screening and checkup Recommended yearly dental visit for hygiene and checkup  Vaccinations: Influenza vaccine : Up to date  Pneumococcal vaccine : Up to date  Tdap vaccine : Up to date  Shingles vaccine : Up to date     Advanced directives: yes   Conditions/risks identified: Advance age male > 42 yrs and Male gender   Next appointment: 1 year    Preventive Care 36 Years and Older, Male Preventive care refers to lifestyle choices and visits with your health care provider that can promote health and wellness. What does preventive care include? A yearly physical exam. This is also called an annual well check. Dental exams once or twice a year. Routine eye exams. Ask your health care provider how often you should have your eyes checked. Personal lifestyle choices, including: Daily care of your teeth and gums. Regular physical activity. Eating a healthy diet. Avoiding tobacco and drug use. Limiting alcohol use. Practicing safe sex. Taking low doses of aspirin every day. Taking vitamin and mineral supplements as recommended by your health care provider. What happens during an annual well check? The services and screenings done by your health care provider during your annual well check will depend on your age, overall health, lifestyle risk factors, and family history of disease. Counseling  Your health care provider may ask you questions about your: Alcohol use. Tobacco use. Drug use. Emotional well-being. Home and relationship well-being. Sexual activity. Eating habits. History of falls. Memory and ability to understand  (cognition). Work and work Statistician. Screening  You may have the following tests or measurements: Height, weight, and BMI. Blood pressure. Lipid and cholesterol levels. These may be checked every 5 years, or more frequently if you are over 60 years old. Skin check. Lung cancer screening. You may have this screening every year starting at age 67 if you have a 30-pack-year history of smoking and currently smoke or have quit within the past 15 years. Fecal occult blood test (FOBT) of the stool. You may have this test every year starting at age 75. Flexible sigmoidoscopy or colonoscopy. You may have a sigmoidoscopy every 5 years or a colonoscopy every 10 years starting at age 69. Prostate cancer screening. Recommendations will vary depending on your family history and other risks. Hepatitis C blood test. Hepatitis B blood test. Sexually transmitted disease (STD) testing. Diabetes screening. This is done by checking your blood sugar (glucose) after you have not eaten for a while (fasting). You may have this done every 1-3 years. Abdominal aortic aneurysm (AAA) screening. You may need this if you are a current or former smoker. Osteoporosis. You may be screened starting at age 41 if you are at high risk. Talk with your health care provider about your test results, treatment options, and if necessary, the need for more tests. Vaccines  Your health care provider may recommend certain vaccines, such as: Influenza vaccine. This is recommended every year. Tetanus, diphtheria, and acellular pertussis (Tdap, Td) vaccine. You may need a Td booster every 10 years. Zoster vaccine. You may need this after age 75. Pneumococcal 13-valent conjugate (PCV13) vaccine. One dose is recommended after age 59. Pneumococcal polysaccharide (PPSV23) vaccine. One  dose is recommended after age 73. Talk to your health care provider about which screenings and vaccines you need and how often you need them. This  information is not intended to replace advice given to you by your health care provider. Make sure you discuss any questions you have with your health care provider. Document Released: 06/11/2015 Document Revised: 02/02/2016 Document Reviewed: 03/16/2015 Elsevier Interactive Patient Education  2017 [place] Prevention in the Home Falls can cause injuries. They can happen to people of all ages. There are many things you can do to make your home safe and to help prevent falls. What can I do on the outside of my home? Regularly fix the edges of walkways and driveways and fix any cracks. Remove anything that might make you trip as you walk through a door, such as a raised step or threshold. Trim any bushes or trees on the path to your home. Use bright outdoor lighting. Clear any walking paths of anything that might make someone trip, such as rocks or tools. Regularly check to see if handrails are loose or broken. Make sure that both sides of any steps have handrails. Any raised decks and porches should have guardrails on the edges. Have any leaves, snow, or ice cleared regularly. Use sand or salt on walking paths during winter. Clean up any spills in your garage right away. This includes oil or grease spills. What can I do in the bathroom? Use night lights. Install grab bars by the toilet and in the tub and shower. Do not use towel bars as grab bars. Use non-skid mats or decals in the tub or shower. If you need to sit down in the shower, use a plastic, non-slip stool. Keep the floor dry. Clean up any water that spills on the floor as soon as it happens. Remove soap buildup in the tub or shower regularly. Attach bath mats securely with double-sided non-slip rug tape. Do not have throw rugs and other things on the floor that can make you trip. What can I do in the bedroom? Use night lights. Make sure that you have a light by your bed that is easy to reach. Do not use any sheets or  blankets that are too big for your bed. They should not hang down onto the floor. Have a firm chair that has side arms. You can use this for support while you get dressed. Do not have throw rugs and other things on the floor that can make you trip. What can I do in the kitchen? Clean up any spills right away. Avoid walking on wet floors. Keep items that you use a lot in easy-to-reach places. If you need to reach something above you, use a strong step stool that has a grab bar. Keep electrical cords out of the way. Do not use floor polish or wax that makes floors slippery. If you must use wax, use non-skid floor wax. Do not have throw rugs and other things on the floor that can make you trip. What can I do with my stairs? Do not leave any items on the stairs. Make sure that there are handrails on both sides of the stairs and use them. Fix handrails that are broken or loose. Make sure that handrails are as long as the stairways. Check any carpeting to make sure that it is firmly attached to the stairs. Fix any carpet that is loose or worn. Avoid having throw rugs at the top or bottom of the  stairs. If you do have throw rugs, attach them to the floor with carpet tape. Make sure that you have a light switch at the top of the stairs and the bottom of the stairs. If you do not have them, ask someone to add them for you. What else can I do to help prevent falls? Wear shoes that: Do not have high heels. Have rubber bottoms. Are comfortable and fit you well. Are closed at the toe. Do not wear sandals. If you use a stepladder: Make sure that it is fully opened. Do not climb a closed stepladder. Make sure that both sides of the stepladder are locked into place. Ask someone to hold it for you, if possible. Clearly mark and make sure that you can see: Any grab bars or handrails. First and last steps. Where the edge of each step is. Use tools that help you move around (mobility aids) if they are  needed. These include: Canes. Walkers. Scooters. Crutches. Turn on the lights when you go into a dark area. Replace any light bulbs as soon as they burn out. Set up your furniture so you have a clear path. Avoid moving your furniture around. If any of your floors are uneven, fix them. If there are any pets around you, be aware of where they are. Review your medicines with your doctor. Some medicines can make you feel dizzy. This can increase your chance of falling. Ask your doctor what other things that you can do to help prevent falls. This information is not intended to replace advice given to you by your health care provider. Make sure you discuss any questions you have with your health care provider. Document Released: 03/11/2009 Document Revised: 10/21/2015 Document Reviewed: 06/19/2014 Elsevier Interactive Patient Education  2017 [employer].

## 2021-05-04 DIAGNOSIS — D045 Carcinoma in situ of skin of trunk: Secondary | ICD-10-CM | POA: Diagnosis not present

## 2021-05-04 DIAGNOSIS — D0439 Carcinoma in situ of skin of other parts of face: Secondary | ICD-10-CM | POA: Diagnosis not present

## 2021-05-05 ENCOUNTER — Other Ambulatory Visit: Payer: Self-pay | Admitting: *Deleted

## 2021-05-05 DIAGNOSIS — N529 Male erectile dysfunction, unspecified: Secondary | ICD-10-CM

## 2021-05-05 MED ORDER — SILDENAFIL CITRATE 100 MG PO TABS: 100.0000 mg | ORAL_TABLET | Freq: Every day | ORAL | 3 refills | Status: DC | PRN

## 2021-05-05 NOTE — Telephone Encounter (Signed)
[facility] requested refill.

## 2021-07-11 ENCOUNTER — Other Ambulatory Visit: Payer: Self-pay

## 2021-07-11 ENCOUNTER — Non-Acute Institutional Stay: Payer: Medicare Other | Admitting: Adult Health

## 2021-07-11 ENCOUNTER — Encounter: Payer: Self-pay | Admitting: Adult Health

## 2021-07-11 VITALS — BP 136/72 | HR 83 | Temp 97.5°F | Ht 71.0 in | Wt 204.0 lb

## 2021-07-11 DIAGNOSIS — M5441 Lumbago with sciatica, right side: Secondary | ICD-10-CM

## 2021-07-11 MED ORDER — PREDNISONE 10 MG PO TABS: ORAL_TABLET | ORAL | 0 refills | Status: DC

## 2021-07-11 NOTE — Patient Instructions (Signed)
Try prednisone taper, if not improving call us back for possible referral

## 2021-07-11 NOTE — Progress Notes (Signed)
[facility]  Provider:  Cindi Carbon, [place] [phone]   Code Status:  Goals of Care:  Advanced Directives 04/05/2021  Does Patient Have a Medical Advance Directive? Yes  Type of Advance Directive [place]  Does patient want to make changes to medical advance directive? No - Patient declined  Copy of [place] in Chart? Yes - validated most recent copy scanned in chart (See row information)  Would patient like information on creating a medical advance directive? -     Chief Complaint  Patient presents with   Acute Visit    Right leg pain    HPI: Patient is a 77 y.o. male seen today for an acute visit for right leg pain. He reports 2 months ago he was lifting heavy items and felt a strain in his low back. Since then he has had pain in the right side of his low back radiating down to his right thigh. He has tried ibuprofen with no relief. He feels he is not improving. He has not numbness or tingling. No change in bowel or bladder habits. Has normal gait. Pain is worse in the am.   Past Medical History:  Diagnosis Date   Asthma    Calcific Achilles tendonitis    bilateral   Controlled type 2 diabetes mellitus without complication (HCC)    DDD (degenerative disc disease), cervical    Gallstone 09/11/2011   GERD (gastroesophageal reflux disease)    Hyperlipidemia    mixed   Insomnia    Mild cognitive impairment    Overweight (BMI 25.0-29.9)    Prostate cancer ([place])    Pulmonary nodule/lesion, solitary 06/2004   f/u in 2014, 2015 stable in [facility]   Sleep apnea    Slow transit constipation    Vitamin D deficiency     Past Surgical History:  Procedure Laterality Date   CERVICAL FUSION  1999 & 2004   implant metal plate   CHOLECYSTECTOMY  2012   COLONOSCOPY  12/2015   EYE SURGERY  1997   laser vision correction   palatouvulopasty  1995   PROSTATE SURGERY  09/11/2011   prostatectomy for prostate  cancer    TONSILLECTOMY     WISDOM TOOTH EXTRACTION      Allergies  Allergen Reactions   Tetracyclines & Related Other (See Comments)    hepatitis   Niacin And Related Other (See Comments)    headache   Wellbutrin [Bupropion] Other (See Comments)    dizziness    Outpatient Encounter Medications as of 07/11/2021  Medication Sig   b complex vitamins capsule Take 1 capsule by mouth daily.   Coenzyme Q10 (CO Q 10) 100 MG CAPS Take 2 capsules by mouth daily.   fexofenadine (ALLEGRA) 60 MG tablet Take 60 mg by mouth daily.   metFORMIN (GLUCOPHAGE-XR) 500 MG 24 hr tablet Take 2 tablets (1,000 mg total) by mouth 2 (two) times daily with a meal.   polyethylene glycol (MIRALAX / GLYCOLAX) packet Take 17 g by mouth daily.   sildenafil (VIAGRA) 100 MG tablet Take 1 tablet (100 mg total) by mouth daily as needed for erectile dysfunction.   simvastatin (ZOCOR) 40 MG tablet Take one tablet by mouth once daily in the evening.   Turmeric 500 MG CAPS Take 500 mg by mouth 2 (two) times daily.   Vitamin D, Ergocalciferol, (DRISDOL) 1.25 MG (50000 UNIT) CAPS capsule TAKE 1 CAPSULE ONCE A WEEK.   No facility-administered  encounter medications on file as of 07/11/2021.    Review of Systems:  Review of Systems  Constitutional:  Positive for activity change. Negative for appetite change, chills, diaphoresis, fatigue, fever and unexpected weight change.  Respiratory:  Negative for cough, shortness of breath, wheezing and stridor.   Cardiovascular:  Negative for chest pain, palpitations and leg swelling.  Gastrointestinal:  Negative for abdominal distention, abdominal pain, constipation and diarrhea.  Genitourinary:  Negative for difficulty urinating and dysuria.  Musculoskeletal:  Positive for back pain. Negative for arthralgias, gait problem, joint swelling and myalgias.  Neurological:  Negative for dizziness, seizures, syncope, facial asymmetry, speech difficulty, weakness, numbness and headaches.   Hematological:  Negative for adenopathy. Does not bruise/bleed easily.  Psychiatric/Behavioral:  Negative for agitation, behavioral problems and confusion.    Health Maintenance  Topic Date Due   COVID-19 Vaccine (4 - Booster) 06/08/2020   OPHTHALMOLOGY EXAM  06/09/2020   FOOT EXAM  04/14/2021   HEMOGLOBIN A1C  08/10/2021   URINE MICROALBUMIN  02/10/2022   TETANUS/TDAP  05/07/2027   COLONOSCOPY (Pts 45-37yrs Insurance coverage will need to be confirmed)  12/16/2028   Pneumonia Vaccine 27+ Years old  Completed   INFLUENZA VACCINE  Completed   Hepatitis C Screening  Completed   Zoster Vaccines- Shingrix  Completed   HPV VACCINES  Aged Out    Physical Exam: Vitals:   07/11/21 1432  BP: 136/72  Pulse: 83  Temp: (!) 97.5 F (36.4 C)  SpO2: 97%  Weight: 204 lb (92.5 kg)  Height: 5\' 11"  (1.803 m)   Body mass index is 28.45 kg/m. Physical Exam Vitals reviewed.  Cardiovascular:     Rate and Rhythm: Normal rate and regular rhythm.  Pulmonary:     Effort: Pulmonary effort is normal.     Breath sounds: Normal breath sounds.  Musculoskeletal:        General: No swelling, tenderness or deformity.     Lumbar back: No swelling, edema, deformity, signs of trauma, lacerations, spasms, tenderness or bony tenderness. Normal range of motion. Negative right straight leg raise test and negative left straight leg raise test. No scoliosis.     Right lower leg: No edema.     Left lower leg: No edema.     Comments: Strength 5/5 to BLE  Skin:    General: Skin is warm and dry.  Neurological:     Mental Status: He is oriented to person, place, and time. Mental status is at baseline.     Gait: Gait is intact.     Deep Tendon Reflexes:     Reflex Scores:      Patellar reflexes are 1+ on the right side and 1+ on the left side.      Achilles reflexes are 1+ on the right side and 1+ on the left side.   Labs reviewed: Basic Metabolic Panel: Recent Labs    08/05/20 0000 02/10/21 0000  NA  139 141  K 4.9 4.8  CL 102 102  CO2 26* 27*  BUN 15 11  CREATININE 1.0 1.1  CALCIUM 9.8 9.8   Liver Function Tests: Recent Labs    02/10/21 0000  AST 18  ALT 23  ALKPHOS 59  ALBUMIN 4.8   No results for input(s): LIPASE, AMYLASE in the last 8760 hours. No results for input(s): AMMONIA in the last 8760 hours. CBC: Recent Labs    02/10/21 0000  WBC 6.0  HGB 14.4  HCT 43  PLT 144*   Lipid  Panel: Recent Labs    08/05/20 0000  CHOL 107  HDL 47  LDLCALC 49  TRIG 57   Lab Results  Component Value Date   HGBA1C 6.7 02/10/2021    Procedures since last visit: No results found.  Assessment/Plan  1. Acute right-sided low back pain with right-sided sciatica No red flag signs  Has tried prednisone before without s/e  Does have hx of DM which has been diet controlled.  Try prednisone taper 40 mg qd x 4 d, 30 mg qd 3 d, 20 mg qd 2d, 10 mg qd x 1 if not improving call us back for possible referral to neurosurgery    Labs/tests ordered:  * No order type specified * Next appt:  08/31/2021   Total time 4min:  time greater than 50% of total time spent doing pt counseling and coordination of care

## 2021-07-12 ENCOUNTER — Other Ambulatory Visit: Payer: Self-pay | Admitting: Adult Health

## 2021-07-12 ENCOUNTER — Telehealth: Payer: Self-pay | Admitting: *Deleted

## 2021-07-12 MED ORDER — PREDNISONE 10 MG PO TABS: ORAL_TABLET | ORAL | 0 refills | Status: AC

## 2021-07-12 NOTE — Telephone Encounter (Signed)
[facility] sent fax stating that they received a Rx for patient for Prednisone but was only given #10 tablets. Pharmacy stated that that won't last long.   Requesting new Rx to be faxed.  Please Advise.

## 2021-07-12 NOTE — Telephone Encounter (Signed)
Updated and resent script for 30 tablets.

## 2021-08-02 ENCOUNTER — Telehealth: Payer: Self-pay | Admitting: *Deleted

## 2021-08-02 DIAGNOSIS — M5441 Lumbago with sciatica, right side: Secondary | ICD-10-CM

## 2021-08-02 NOTE — Telephone Encounter (Signed)
Patient Notified and agreed. 

## 2021-08-02 NOTE — Telephone Encounter (Signed)
Patient called and stated that he is still having sciatica pain in leg and requesting a CT Scan to be done.  ? ?Finished Prednisone and have done the exercising.  ? ?Requesting you to place an order to have CT Scan with contrast done on leg. Patient stated that he is on Metformin.  ?Please Advise.  ? ? ? ?OV Note Dated 07/11/2021: ?Assessment/Plan ?  ?1. Acute right-sided low back pain with right-sided sciatica ?No red flag signs  ?Has tried prednisone before without s/e  ?Does have hx of DM which has been diet controlled.  ?Try prednisone taper 40 mg qd x 4 d, 30 mg qd 3 d, 20 mg qd 2d, 10 mg qd x 1 ?if not improving call us back for possible referral to neurosurgery  ?

## 2021-08-02 NOTE — Telephone Encounter (Signed)
After discussing with Dr. Lyndel Safe we feel that an MRI without contrast would be the best option. Will order in epic. Thanks. ?

## 2021-08-03 ENCOUNTER — Telehealth: Payer: Self-pay

## 2021-08-03 ENCOUNTER — Other Ambulatory Visit: Payer: Self-pay

## 2021-08-03 DIAGNOSIS — E119 Type 2 diabetes mellitus without complications: Secondary | ICD-10-CM

## 2021-08-03 MED ORDER — METFORMIN HCL ER 500 MG PO TB24: 1000.0000 mg | ORAL_TABLET | Freq: Two times a day (BID) | ORAL | 1 refills | Status: DC

## 2021-08-03 MED ORDER — SIMVASTATIN 40 MG PO TABS: ORAL_TABLET | ORAL | 0 refills | Status: DC

## 2021-08-03 MED ORDER — VITAMIN D (ERGOCALCIFEROL) 1.25 MG (50000 UNIT) PO CAPS: ORAL_CAPSULE | ORAL | 0 refills | Status: DC

## 2021-08-03 NOTE — Telephone Encounter (Signed)
Incoming call received from patient stating he needs a notation made on order for MRI indicating he needs to have his head slightly elevated due to metal plate in neck. ? ?I discussed this with the referral coordinator and was advised to go to patients appointment desk, view active request tab, and edit scheduling notes. ? ?Notation was made as requested by the patient. This notation should be taken into consideration by the scheduler at the imaging center. ? ? ?

## 2021-08-03 NOTE — Addendum Note (Signed)
Addended by: Logan Bores on: 08/03/2021 04:52 PM ? ? Modules accepted: Orders ? ?

## 2021-08-09 DIAGNOSIS — E785 Hyperlipidemia, unspecified: Secondary | ICD-10-CM | POA: Diagnosis not present

## 2021-08-09 DIAGNOSIS — E559 Vitamin D deficiency, unspecified: Secondary | ICD-10-CM | POA: Diagnosis not present

## 2021-08-09 DIAGNOSIS — E119 Type 2 diabetes mellitus without complications: Secondary | ICD-10-CM | POA: Diagnosis not present

## 2021-08-09 LAB — HEPATIC FUNCTION PANEL
ALT: 24 U/L (ref 10–40)
AST: 15 (ref 14–40)
Alkaline Phosphatase: 52 (ref 25–125)
Bilirubin, Total: 0.4

## 2021-08-09 LAB — CBC: RBC: 4.64 (ref 3.87–5.11)

## 2021-08-09 LAB — CBC AND DIFFERENTIAL
HCT: 40 — AB (ref 41–53)
Hemoglobin: 13.4 — AB (ref 13.5–17.5)
Platelets: 175 10*3/uL (ref 150–400)
WBC: 4.6

## 2021-08-09 LAB — BASIC METABOLIC PANEL
BUN: 17 (ref 4–21)
CO2: 24 — AB (ref 13–22)
Chloride: 98 — AB (ref 99–108)
Creatinine: 0.9 (ref 0.6–1.3)
Glucose: 221
Potassium: 4.8 mEq/L (ref 3.5–5.1)
Sodium: 136 — AB (ref 137–147)

## 2021-08-09 LAB — TSH: TSH: 4.03 (ref 0.41–5.90)

## 2021-08-09 LAB — HEMOGLOBIN A1C: Hemoglobin A1C: 8.1

## 2021-08-09 LAB — LIPID PANEL
Cholesterol: 101 (ref 0–200)
HDL: 45 (ref 35–70)
LDL Cholesterol: 35
LDl/HDL Ratio: 2.2
Triglycerides: 105 (ref 40–160)

## 2021-08-09 LAB — COMPREHENSIVE METABOLIC PANEL
Albumin: 4.4 (ref 3.5–5.0)
Calcium: 9.7 (ref 8.7–10.7)
Globulin: 2.1

## 2021-08-09 LAB — VITAMIN D 25 HYDROXY (VIT D DEFICIENCY, FRACTURES): Vit D, 25-Hydroxy: 57.3

## 2021-08-11 ENCOUNTER — Ambulatory Visit
Admission: RE | Admit: 2021-08-11 | Discharge: 2021-08-11 | Disposition: A | Discharge status: Home / Self Care | Payer: Medicare Other | Source: Ambulatory Visit | Attending: Adult Health | Admitting: Adult Health

## 2021-08-11 ENCOUNTER — Other Ambulatory Visit: Payer: Self-pay

## 2021-08-11 DIAGNOSIS — M47816 Spondylosis without myelopathy or radiculopathy, lumbar region: Secondary | ICD-10-CM | POA: Diagnosis not present

## 2021-08-11 DIAGNOSIS — M4316 Spondylolisthesis, lumbar region: Secondary | ICD-10-CM | POA: Diagnosis not present

## 2021-08-11 DIAGNOSIS — M5416 Radiculopathy, lumbar region: Secondary | ICD-10-CM | POA: Diagnosis not present

## 2021-08-11 DIAGNOSIS — M5441 Lumbago with sciatica, right side: Secondary | ICD-10-CM

## 2021-08-11 DIAGNOSIS — M48061 Spinal stenosis, lumbar region without neurogenic claudication: Secondary | ICD-10-CM | POA: Diagnosis not present

## 2021-08-11 DIAGNOSIS — M545 Low back pain, unspecified: Secondary | ICD-10-CM | POA: Diagnosis not present

## 2021-08-12 ENCOUNTER — Other Ambulatory Visit: Payer: Self-pay | Admitting: Adult Health

## 2021-08-12 DIAGNOSIS — M5441 Lumbago with sciatica, right side: Secondary | ICD-10-CM

## 2021-08-14 ENCOUNTER — Other Ambulatory Visit: Payer: Medicare Other

## 2021-08-15 ENCOUNTER — Telehealth: Payer: Self-pay

## 2021-08-15 DIAGNOSIS — M5441 Lumbago with sciatica, right side: Secondary | ICD-10-CM

## 2021-08-15 MED ORDER — GABAPENTIN 100 MG PO CAPS: 100.0000 mg | ORAL_CAPSULE | Freq: Three times a day (TID) | ORAL | 1 refills | Status: DC | PRN

## 2021-08-15 NOTE — Telephone Encounter (Signed)
Recommend starting gabapentin 100 mg three times daily as needed for pain. Script sent. If not improving let me know. May need to be seen in the office. ? ?

## 2021-08-15 NOTE — Telephone Encounter (Signed)
Patient discussed MRI results and pain medication and he declined. He now says after having a miserable weekend he has changed his mind and would like to have pain medication ? ?Please advise and send medication to pharmacy. Message routed to Royal Hawthorn, NP ?

## 2021-08-15 NOTE — Telephone Encounter (Signed)
Called and left patient a message informing him that medication was sent to pharmacy and if he does not seem to be improving to call office. ?

## 2021-08-16 ENCOUNTER — Other Ambulatory Visit: Payer: Self-pay | Admitting: *Deleted

## 2021-08-16 DIAGNOSIS — D869 Sarcoidosis, unspecified: Secondary | ICD-10-CM

## 2021-08-17 ENCOUNTER — Telehealth: Payer: Self-pay | Admitting: *Deleted

## 2021-08-17 ENCOUNTER — Encounter: Payer: Medicare Other | Admitting: Internal Medicine

## 2021-08-17 DIAGNOSIS — M5441 Lumbago with sciatica, right side: Secondary | ICD-10-CM

## 2021-08-17 MED ORDER — TRAMADOL HCL 50 MG PO TABS: 50.0000 mg | ORAL_TABLET | Freq: Four times a day (QID) | ORAL | 0 refills | Status: DC | PRN

## 2021-08-17 NOTE — Telephone Encounter (Signed)
Will prescribed Ultram for pain. He should f/u with me in the next 1-2 weeks unless he has an apt with neurosurgery coming up sooner. He should be advised to avoid driving while taking Ultram.  ?

## 2021-08-17 NOTE — Telephone Encounter (Addendum)
LMOM to return call.  ?Has an appointment scheduled with Dr. Lyndel Safe for 08/31/2021 ? ?

## 2021-08-17 NOTE — Telephone Encounter (Signed)
Patient Notified and agreed. Patient can handle the pain most of the time in the afternoon but worse in the mornings.  ? ?Has an appointment scheduled with Dr. Lyndel Safe 08/31/2021 ?

## 2021-08-17 NOTE — Telephone Encounter (Signed)
Patient called and stated that the Pain medication, Gabapentin is not touching the pain at all, especially in the morning.  ? ?Patient requesting something stronger.  ? ?Stated that he has a PET Scan scheduled and an OV with Dr. [name] 09/19/2021.  ? ?Please Advise.  ?

## 2021-08-22 ENCOUNTER — Other Ambulatory Visit: Payer: Self-pay | Admitting: Adult Health

## 2021-08-22 DIAGNOSIS — M5441 Lumbago with sciatica, right side: Secondary | ICD-10-CM

## 2021-08-22 MED ORDER — TRAMADOL HCL 50 MG PO TABS: 50.0000 mg | ORAL_TABLET | Freq: Four times a day (QID) | ORAL | 0 refills | Status: DC | PRN

## 2021-08-22 NOTE — Addendum Note (Signed)
Addended by: Rafael Bihari A on: 08/22/2021 08:35 AM ? ? Modules accepted: Orders ? ?

## 2021-08-22 NOTE — Telephone Encounter (Signed)
Patient called requesting refill on the Tramadol. Stated that it is working well. Stated that he is taking it every 6 hours and needs more than 3/day.  ? ?Patient stated that it is working and the pain has been relieved significantly.  ? ?Requesting refill for enough every 6 hours.  ?Has an appointment with Dr. Lyndel Safe on 08/31/2021. ?Pended Rx for approval  ?  ?

## 2021-08-29 ENCOUNTER — Encounter: Payer: Medicare Other | Admitting: Adult Health

## 2021-08-31 ENCOUNTER — Non-Acute Institutional Stay: Payer: Medicare Other | Admitting: Internal Medicine

## 2021-08-31 ENCOUNTER — Encounter: Payer: Self-pay | Admitting: Internal Medicine

## 2021-08-31 VITALS — BP 126/70 | HR 76 | Temp 97.7°F | Ht 71.0 in | Wt 204.0 lb

## 2021-08-31 DIAGNOSIS — M5441 Lumbago with sciatica, right side: Secondary | ICD-10-CM | POA: Diagnosis not present

## 2021-08-31 DIAGNOSIS — K5909 Other constipation: Secondary | ICD-10-CM

## 2021-08-31 DIAGNOSIS — N529 Male erectile dysfunction, unspecified: Secondary | ICD-10-CM | POA: Diagnosis not present

## 2021-08-31 DIAGNOSIS — D869 Sarcoidosis, unspecified: Secondary | ICD-10-CM

## 2021-08-31 DIAGNOSIS — E782 Mixed hyperlipidemia: Secondary | ICD-10-CM

## 2021-08-31 DIAGNOSIS — E1165 Type 2 diabetes mellitus with hyperglycemia: Secondary | ICD-10-CM

## 2021-08-31 DIAGNOSIS — G4730 Sleep apnea, unspecified: Secondary | ICD-10-CM

## 2021-08-31 MED ORDER — FLUOCINONIDE 0.05 % EX SOLN: 1.0000 "application " | Freq: Two times a day (BID) | CUTANEOUS | 0 refills | Status: DC | PRN

## 2021-08-31 MED ORDER — SITAGLIPTIN PHOSPHATE 25 MG PO TABS: 25.0000 mg | ORAL_TABLET | Freq: Every day | ORAL | 3 refills | Status: DC

## 2021-08-31 NOTE — Progress Notes (Signed)
? ?Location:  [facility] ?  ?Place of Service:  Clinic (12) ? ?Provider:  ? ?Code Status:  ?Goals of Care:  ? ?  04/05/2021  ?  1:35 PM  ?Advanced Directives  ?Does Patient Have a Medical Advance Directive? Yes  ?Type of Advance Directive Healthcare Power of Attorney  ?Does patient want to make changes to medical advance directive? No - Patient declined  ?Copy of [place] in Chart? Yes - validated most recent copy scanned in chart (See row information)  ? ? ? ?Chief Complaint  ?Patient presents with  ? Medical Management of Chronic Issues  ?  Patient returns to the clinic for 6 month follow up.   ? Quality Metric Gaps  ?  Due for foot and eye exam and #4 covid vaccine  ? ? ?HPI: Patient is a 76 y.o. male seen today for medical management of chronic diseases.   ? ? ?Patient has h/o Diabetes Mellitus type 2  ?Sarcoidosis Diagnosed with Lymph node Biopsy ?Not on Active treatment ?Follows with Pulmonology ?Does have Prednisone Prescription PRN if needed ?H/o Sleep Apnea on CPAP  ? ? ?Patient was seen by [facility] with low back pain radiating down his right leg.  He said that he had an off-and-on pain for a few months and had acute worsening when he was lifting heavy items.  He was given tapering dose of prednisone. ?MRI of the lower back was ordered which showed bilateral stenosis with disc bulging in L4-5 and L5-S1 Also Seen Some lesions ? Sarcoidosis ?He has appointment to see Dr. Trenton Gammon in neurosurgery. ?His pain was not controlled with gabapentin.  It was eventually he started taking tramadol which helped his pain.  He has tapered himself to now 3 tablets a day. ?He is planning a trip with his grandson which involves 4 to 8 hours of driving and he wants to make sure he has tramadol to help with his pain. ? ?During this time his blood sugars have also jumped up to 200 and A1c is 8.  He had started watching his diet.  Continues to do walking. ? ? ? ?Past Medical History:   ?Diagnosis Date  ? Asthma   ? Calcific Achilles tendonitis   ? bilateral  ? Controlled type 2 diabetes mellitus without complication ([place])   ? DDD (degenerative disc disease), cervical   ? Gallstone 09/11/2011  ? GERD (gastroesophageal reflux disease)   ? Hyperlipidemia   ? mixed  ? Insomnia   ? Mild cognitive impairment   ? Overweight (BMI 25.0-29.9)   ? Prostate cancer ([place])   ? Pulmonary nodule/lesion, solitary 06/2004  ? f/u in 2014, 2015 stable in [facility]  ? Sleep apnea   ? Slow transit constipation   ? Vitamin D deficiency   ? ? ?Past Surgical History:  ?Procedure Laterality Date  ? [place] & 2004  ? implant metal plate  ? CHOLECYSTECTOMY  2012  ? COLONOSCOPY  12/2015  ? EYE SURGERY  1997  ? laser vision correction  ? palatouvulopasty  1995  ? PROSTATE SURGERY  09/11/2011  ? prostatectomy for prostate cancer   ? TONSILLECTOMY    ? WISDOM TOOTH EXTRACTION    ? ? ?Allergies  ?Allergen Reactions  ? Tetracyclines & Related Other (See Comments)  ?  hepatitis  ? Niacin And Related Other (See Comments)  ?  headache  ? Wellbutrin [Bupropion] Other (See Comments)  ?  dizziness  ? ? ?Outpatient Encounter  Medications as of 08/31/2021  ?Medication Sig  ? b complex vitamins capsule Take 1 capsule by mouth daily.  ? Coenzyme Q10 (CO Q 10) 100 MG CAPS Take 2 capsules by mouth daily.  ? fexofenadine (ALLEGRA) 60 MG tablet Take 60 mg by mouth daily.  ? fluocinonide (LIDEX) 0.05 % external solution Apply 1 application. topically 2 (two) times daily as needed.  ? gabapentin (NEURONTIN) 100 MG capsule Take 1 capsule (100 mg total) by mouth 3 (three) times daily as needed.  ? metFORMIN (GLUCOPHAGE-XR) 500 MG 24 hr tablet Take 2 tablets (1,000 mg total) by mouth 2 (two) times daily with a meal.  ? polyethylene glycol (MIRALAX / GLYCOLAX) packet Take 17 g by mouth daily.  ? sildenafil (VIAGRA) 100 MG tablet Take 1 tablet (100 mg total) by mouth daily as needed for erectile dysfunction.  ? simvastatin (ZOCOR) 40 MG tablet  Take one tablet by mouth once daily in the evening.  ? sitaGLIPtin (JANUVIA) 25 MG tablet Take 1 tablet (25 mg total) by mouth daily.  ? Turmeric 500 MG CAPS Take 500 mg by mouth. Twice a week  ? Vitamin D, Ergocalciferol, (DRISDOL) 1.25 MG (50000 UNIT) CAPS capsule TAKE 1 CAPSULE ONCE A WEEK.  ? [DISCONTINUED] traMADol (ULTRAM) 50 MG tablet Take 1 tablet (50 mg total) by mouth every 6 (six) hours as needed for up to 14 days.  ? ?No facility-administered encounter medications on file as of 08/31/2021.  ? ? ?Review of Systems:  ?Review of Systems  ?Constitutional:  Positive for activity change. Negative for appetite change and unexpected weight change.  ?HENT: Negative.    ?Respiratory:  Positive for cough. Negative for shortness of breath.   ?Cardiovascular:  Negative for leg swelling.  ?Gastrointestinal:  Negative for constipation.  ?Genitourinary:  Negative for frequency.  ?Musculoskeletal:  Positive for back pain and gait problem. Negative for arthralgias and myalgias.  ?Skin: Negative.  Negative for rash.  ?Neurological:  Negative for dizziness and weakness.  ?Psychiatric/Behavioral:  Negative for confusion and sleep disturbance.   ?All other systems reviewed and are negative. ? ?Health Maintenance  ?Topic Date Due  ? COVID-19 Vaccine (4 - Booster) 06/08/2020  ? OPHTHALMOLOGY EXAM  06/09/2020  ? FOOT EXAM  04/14/2021  ? HEMOGLOBIN A1C  08/10/2021  ? INFLUENZA VACCINE  12/27/2021  ? URINE MICROALBUMIN  02/10/2022  ? TETANUS/TDAP  05/07/2027  ? Pneumonia Vaccine 31+ Years old  Completed  ? Hepatitis C Screening  Completed  ? Zoster Vaccines- Shingrix  Completed  ? HPV VACCINES  Aged Out  ? COLONOSCOPY (Pts 45-69yr Insurance coverage will need to be confirmed)  Discontinued  ? ? ?Physical Exam: ?Vitals:  ? 08/31/21 0938  ?BP: 126/70  ?Pulse: 76  ?Temp: 97.7 ?F (36.5 ?C)  ?SpO2: 96%  ?Weight: 204 lb (92.5 kg)  ?Height: '5\' 11"'$  (1.803 m)  ? ?Body mass index is 28.45 kg/m?.Marland Kitchen?Physical Exam ?Vitals reviewed.   ?Constitutional:   ?   Appearance: Normal appearance.  ?HENT:  ?   Head: Normocephalic.  ?   Mouth/Throat:  ?   Mouth: Mucous membranes are moist.  ?   Pharynx: Oropharynx is clear.  ?Eyes:  ?   Pupils: Pupils are equal, round, and reactive to light.  ?Cardiovascular:  ?   Rate and Rhythm: Normal rate and regular rhythm.  ?   Pulses: Normal pulses.  ?   Heart sounds: No murmur heard. ?Pulmonary:  ?   Effort: Pulmonary effort is normal. No respiratory  distress.  ?   Breath sounds: Normal breath sounds. No rales.  ?Abdominal:  ?   General: Abdomen is flat. Bowel sounds are normal.  ?   Palpations: Abdomen is soft.  ?Musculoskeletal:     ?   General: No swelling.  ?   Cervical back: Neck supple.  ?   Comments: No Hip Pain or restriction with movement ?Straight Leg Raising was negative ?Gait was stable  ?Skin: ?   General: Skin is warm.  ?Neurological:  ?   General: No focal deficit present.  ?   Mental Status: He is alert and oriented to person, place, and time.  ?Psychiatric:     ?   Mood and Affect: Mood normal.     ?   Thought Content: Thought content normal.  ? ? ?Labs reviewed: ?Basic Metabolic Panel: ?Recent Labs  ?  02/10/21 ?0000  ?NA 141  ?K 4.8  ?CL 102  ?CO2 27*  ?BUN 11  ?CREATININE 1.1  ?CALCIUM 9.8  ? ?Liver Function Tests: ?Recent Labs  ?  02/10/21 ?0000  ?AST 18  ?ALT 23  ?ALKPHOS 59  ?ALBUMIN 4.8  ? ?No results for input(s): LIPASE, AMYLASE in the last 8760 hours. ?No results for input(s): AMMONIA in the last 8760 hours. ?CBC: ?Recent Labs  ?  02/10/21 ?0000  ?WBC 6.0  ?HGB 14.4  ?HCT 43  ?PLT 144*  ? ?Lipid Panel: ?No results for input(s): CHOL, HDL, LDLCALC, TRIG, CHOLHDL, LDLDIRECT in the last 8760 hours. ?Lab Results  ?Component Value Date  ? HGBA1C 6.7 02/10/2021  ? ? ?Procedures since last visit: ?MR Lumbar Spine Wo Contrast ? ?Result Date: 08/12/2021 ?CLINICAL DATA:  Lumbar radiculopathy, symptoms persist with > 6 wks treatment. Low back pain radiating to the right buttock and down the right leg  for 3 months. History of sarcoidosis and prostate cancer. EXAM: MRI LUMBAR SPINE WITHOUT CONTRAST TECHNIQUE: Multiplanar, multisequence MR imaging of the lumbar spine was performed. No intravenous contrast was administered.

## 2021-09-02 ENCOUNTER — Ambulatory Visit (HOSPITAL_COMMUNITY)
Admission: RE | Admit: 2021-09-02 | Discharge: 2021-09-02 | Disposition: A | Discharge status: Home / Self Care | Payer: Medicare Other | Source: Ambulatory Visit | Attending: Pulmonary Disease | Admitting: Pulmonary Disease

## 2021-09-02 DIAGNOSIS — C61 Malignant neoplasm of prostate: Secondary | ICD-10-CM | POA: Diagnosis not present

## 2021-09-02 DIAGNOSIS — D869 Sarcoidosis, unspecified: Secondary | ICD-10-CM | POA: Diagnosis not present

## 2021-09-02 DIAGNOSIS — R918 Other nonspecific abnormal finding of lung field: Secondary | ICD-10-CM | POA: Insufficient documentation

## 2021-09-02 DIAGNOSIS — M899 Disorder of bone, unspecified: Secondary | ICD-10-CM | POA: Diagnosis not present

## 2021-09-02 LAB — GLUCOSE, CAPILLARY: Glucose-Capillary: 144 mg/dL — ABNORMAL HIGH (ref 70–99)

## 2021-09-02 MED ORDER — TRAMADOL HCL 50 MG PO TABS: 50.0000 mg | ORAL_TABLET | Freq: Three times a day (TID) | ORAL | 0 refills | Status: DC | PRN

## 2021-09-02 MED ORDER — FLUDEOXYGLUCOSE F - 18 (FDG) INJECTION
10.1800 | Freq: Once | INTRAVENOUS | Status: AC
  Administered 2021-09-02: 10.18 via INTRAVENOUS

## 2021-09-14 DIAGNOSIS — M4310 Spondylolisthesis, site unspecified: Secondary | ICD-10-CM | POA: Diagnosis not present

## 2021-09-14 DIAGNOSIS — Z6828 Body mass index (BMI) 28.0-28.9, adult: Secondary | ICD-10-CM | POA: Diagnosis not present

## 2021-09-14 DIAGNOSIS — M7138 Other bursal cyst, other site: Secondary | ICD-10-CM | POA: Diagnosis not present

## 2021-09-19 ENCOUNTER — Encounter: Payer: Self-pay | Admitting: Pulmonary Disease

## 2021-09-19 ENCOUNTER — Ambulatory Visit (INDEPENDENT_AMBULATORY_CARE_PROVIDER_SITE_OTHER): Payer: Medicare Other | Admitting: Pulmonary Disease

## 2021-09-19 VITALS — BP 116/62 | HR 67 | Temp 97.5°F | Ht 71.5 in | Wt 207.4 lb

## 2021-09-19 DIAGNOSIS — K769 Liver disease, unspecified: Secondary | ICD-10-CM

## 2021-09-19 DIAGNOSIS — Z9989 Dependence on other enabling machines and devices: Secondary | ICD-10-CM

## 2021-09-19 DIAGNOSIS — G4733 Obstructive sleep apnea (adult) (pediatric): Secondary | ICD-10-CM

## 2021-09-19 DIAGNOSIS — D869 Sarcoidosis, unspecified: Secondary | ICD-10-CM | POA: Diagnosis not present

## 2021-09-19 NOTE — Patient Instructions (Signed)
I am glad you are doing well ?We had reviewed your PET scan which does not show active sarcoidosis ?As we discussed today we will get an MRI of the abdomen with and without contrast for evaluation of the spot that we see in the liver ? ?I will call you with the results of these tests ?Follow-up in 1 year ?

## 2021-09-19 NOTE — Progress Notes (Signed)
? ?      ?Robert Norman    [id number]    01-03-1946 ? ?Primary Care Physician:Gupta, Rene Kocher, MD ? ?Referring Physician: Virgie Dad, MD ?[address] ?[place],  [place] [address] ? ?Chief complaint: Follow-up for OSA, sarcoidosis ? ?HPI: ?76 year old with history of asthma, diabetes, prostate cancer, severe sleep apnea.  He was diagnosed with asthma around 2014.  Previously followed in [place] by Dr. [name].  Maintained on Breo which helps with the symptoms.  Is also using albuterol as needed.  He has history of seasonal allergies and was given steroid nasal spray which did not help with symptoms.  Reports having PFTs about 5 years ago.  Chief complaint is dyspnea on exertion.  He gets short of breath while walking up an incline.  He has constant cough productive of Caradine mucus.  Denies any wheezing, chest congestion, chest pain, hemoptysis. ? ?He said 3 episodes of pneumonia for the past 2 years.  Recently admitted in mid March 2019 for pneumonia with chest x-ray showing retrocardiac opacity.  He was treated for community-acquired pneumonia with IV Rocephin and azithromycin.  Discharged on p.o. Augmentin.  Urine strep pneumo and Legionella was negative. ?History noted for severe OSA.  He is currently on CPAP with good compliance. ? ?In 2019 he got supraclavicular lymph node biopsy after a positive PET scan with diffuse lymphadenopathy.  Findings showed granulomatous inflammation consistent with sarcoidosis but did not require any active treatment ?He is established with Dr. Elsworth Soho for management of sleep apnea, continues on CPAP ? ?Pets: No pets, no exposure to birds, farm animals ?Occupation: He was in [employer] and reports exposure to asbestos.  He later worked in the Systems analyst ?Exposures: Exposure to asbestos.  No mold, dampness, hot tub, Jacuzzi ?Smoking history: Never smoker ?Travel history: Lived in [place] in [place].  He was posted in [place] while in the  [employer]. ?Relevant family history: Significant family history of lung cancer in the parents, OSA. ? ?Interim history: ?Breathing is doing well with no issues ? ?He had imaging recently which showed sclerotic lesions in the spine with a differential diagnosis of sarcoidosis metastatic malignancy.  PET scan was obtained as a follow-up which showed no evidence of active sarcoidosis or active malignancy.  There is however a vague lesion in the liver that will need further evaluation ? ?He tells me that he was told he had hepatitis C in the past but no evidence of cirrhosis.  LFTs in September were normal. ? ?Outpatient Encounter Medications as of 09/19/2021  ?Medication Sig  ? b complex vitamins capsule Take 1 capsule by mouth daily.  ? Coenzyme Q10 (CO Q 10) 100 MG CAPS Take 2 capsules by mouth daily.  ? fexofenadine (ALLEGRA) 60 MG tablet Take 60 mg by mouth daily.  ? fluocinonide (LIDEX) 0.05 % external solution Apply 1 application. topically 2 (two) times daily as needed.  ? metFORMIN (GLUCOPHAGE-XR) 500 MG 24 hr tablet Take 2 tablets (1,000 mg total) by mouth 2 (two) times daily with a meal.  ? polyethylene glycol (MIRALAX / GLYCOLAX) packet Take 17 g by mouth daily.  ? sildenafil (VIAGRA) 100 MG tablet Take 1 tablet (100 mg total) by mouth daily as needed for erectile dysfunction.  ? simvastatin (ZOCOR) 40 MG tablet Take one tablet by mouth once daily in the evening.  ? Turmeric 500 MG CAPS Take 500 mg by mouth. Twice a week  ? Vitamin D, Ergocalciferol, (DRISDOL) 1.25 MG (  50000 UNIT) CAPS capsule TAKE 1 CAPSULE ONCE A WEEK.  ? gabapentin (NEURONTIN) 100 MG capsule Take 1 capsule (100 mg total) by mouth 3 (three) times daily as needed.  ? sitaGLIPtin (JANUVIA) 25 MG tablet Take 1 tablet (25 mg total) by mouth daily.  ? traMADol (ULTRAM) 50 MG tablet Take 1 tablet (50 mg total) by mouth every 8 (eight) hours as needed.  ? ?No facility-administered encounter medications on file as of 09/19/2021.  ? ?Physical Exam: ?Blood  pressure 116/62, pulse 67, temperature (!) 97.5 ?F (36.4 ?C), temperature source Oral, height 5' 11.5" (1.816 m), weight 207 lb 6.4 oz (94.1 kg), SpO2 98 %. ?Gen:      No acute distress ?HEENT:  EOMI, sclera anicteric ?Neck:     No masses; no thyromegaly ?Lungs:    Clear to auscultation bilaterally; normal respiratory effort ?CV:         Regular rate and rhythm; no murmurs ?Abd:      + bowel sounds; soft, non-tender; no palpable masses, no distension ?Ext:    No edema; adequate peripheral perfusion ?Skin:      Warm and dry; no rash ?Neuro: alert and oriented x 3 ?Psych: normal mood and affect  ? ?Data Reviewed: ?Imaging: ?High-resolution CT 10/19/2017-multiple pulmonary nodules ?PET scan 11/09/2017-pulmonary nodules, lymphadenopathy with supraclavicular, axillary, mediastinal and hilar lymph nodes. ?MRI lumbar spine 08/11/2021-severe L4-L5 arthrosis, spinal stenosis.  Indeterminate punctate marrow lesions which are nonspecific ?PET scan 09/02/2021-previously demonstrated hypermetabolic lymphadenopathy has regressed, pulmonary nodularity has improved.  Indeterminate focus of hyperbolic lesion in the left hepatic lobe. ?I reviewed the images personally. ? ?PFTs: ?12/25/2017 ?FVC 4.50 [96%], FEV1 3.9 [94%], F/F 79, TLC 4.59 [63%], DLCO 31.15 [92%] ?Moderate restriction ? ?FENO 10/08/2017-16 ? ?Labs: ?CBC 09/13/2017-WBC 6, eos 1%, absolute eosinophil count 60 ?RAST panel 10/08/2017-negative, IgE 2 ? ?Received labs from [facility] 11/27/2017 ?Metabolic panel, hepatic panel within normal limits ?CBC within normal limits ?Angiotensin-converting enzyme-70 [reference range 9-67] ? ?Pathology: ?Lymph node biopsy 11/20/2017-granulomatous inflammation, AFB stain negative ? ?Sleep: ?Received fax of split-night sleep study performed on 01/01/2014 at [facility] ?Severe obstructive sleep apnea with AHI 28.  CPAP titrated to 9 cm water.  No significant desaturation with application of CPAP therapy ? ?Assessment: ?Sarcoidosis ?Diagnosed with IR  lymph node biopsy in 2019.  He was on intermittent rounds of prednisone for other reasons and not on active treatment/sarcoidosis ?PET scan reviewed with regression of lymphadenopathy and nodularity.  His sarcoidosis appears to be in remission ?Continue monitoring. ? ?Liver lesion ?Noted on recent PET scan.  Will order MRI abdomen with and without contrast for further evaluation ? ?Obstructive sleep apnea ?Continues on CPAP.  Check download. ? ?Plan/Recommendations: ?-Continue CPAP, follow-up with Dr. Elsworth Soho ? ?Marshell Garfinkel MD ?[facility] ?09/19/2021, 9:55 AM ? ?CC: Virgie Dad, MD ? ? ?

## 2021-09-28 ENCOUNTER — Ambulatory Visit (HOSPITAL_COMMUNITY)
Admission: RE | Admit: 2021-09-28 | Discharge: 2021-09-28 | Disposition: A | Discharge status: Home / Self Care | Payer: Medicare Other | Source: Ambulatory Visit | Attending: Pulmonary Disease | Admitting: Pulmonary Disease

## 2021-09-28 DIAGNOSIS — K769 Liver disease, unspecified: Secondary | ICD-10-CM | POA: Insufficient documentation

## 2021-09-28 MED ORDER — GADOBUTROL 1 MMOL/ML IV SOLN
9.0000 mL | Freq: Once | INTRAVENOUS | Status: AC | PRN
  Administered 2021-09-28: 9 mL via INTRAVENOUS

## 2021-10-06 DIAGNOSIS — M5416 Radiculopathy, lumbar region: Secondary | ICD-10-CM | POA: Diagnosis not present

## 2021-10-27 ENCOUNTER — Other Ambulatory Visit: Payer: Self-pay | Admitting: *Deleted

## 2021-10-27 DIAGNOSIS — Z6828 Body mass index (BMI) 28.0-28.9, adult: Secondary | ICD-10-CM | POA: Diagnosis not present

## 2021-10-27 DIAGNOSIS — M4310 Spondylolisthesis, site unspecified: Secondary | ICD-10-CM | POA: Diagnosis not present

## 2021-10-27 MED ORDER — VITAMIN D (ERGOCALCIFEROL) 1.25 MG (50000 UNIT) PO CAPS: ORAL_CAPSULE | ORAL | 1 refills | Status: DC

## 2021-10-27 MED ORDER — SIMVASTATIN 40 MG PO TABS: ORAL_TABLET | ORAL | 1 refills | Status: DC

## 2021-10-27 NOTE — Telephone Encounter (Signed)
[facility] requested refill.  Pended Rx and sent to [facility] for approval.

## 2021-10-27 NOTE — Telephone Encounter (Signed)
[facility] requested refill.

## 2021-10-31 ENCOUNTER — Encounter: Payer: Medicare Other | Admitting: Adult Health

## 2021-11-11 ENCOUNTER — Encounter: Payer: Self-pay | Admitting: Adult Health

## 2021-11-14 ENCOUNTER — Non-Acute Institutional Stay: Payer: Medicare Other | Admitting: Adult Health

## 2021-11-14 ENCOUNTER — Encounter: Payer: Self-pay | Admitting: Adult Health

## 2021-11-14 VITALS — BP 124/78 | HR 90 | Temp 97.7°F | Ht 71.5 in | Wt 208.8 lb

## 2021-11-14 DIAGNOSIS — M5441 Lumbago with sciatica, right side: Secondary | ICD-10-CM

## 2021-11-14 DIAGNOSIS — E1165 Type 2 diabetes mellitus with hyperglycemia: Secondary | ICD-10-CM | POA: Diagnosis not present

## 2021-11-14 NOTE — Patient Instructions (Signed)
Reduce carbs and sugar  Increase exercise up to 5 x a week 30 min

## 2021-11-14 NOTE — Progress Notes (Signed)
Location:  [facility]  POS: Clinic  Provider: Royal Hawthorn, ANP  Code Status:  Goals of Care:     04/05/2021    1:35 PM  Advanced Directives  Does Patient Have a Medical Advance Directive? Yes  Type of Advance Directive [place]  Does patient want to make changes to medical advance directive? No - Patient declined  Copy of [place] in Chart? Yes - validated most recent copy scanned in chart (See row information)     Chief Complaint  Patient presents with   Medical Management of Chronic Issues    Patient returns to the clinic for his 2 month follow up. Patient has no concerns. Patient had eye exam done today at [facility].     HPI: Patient is a 76 y.o. male seen today for medical management of chronic diseases.   PMH significant for sarcoidosis by lymph node bx and follows with pulmonary Hx of sleep apnea on cpap  Started on Januvia on 4/5 due to A1C of 8 but did not take it due to cost $300 Had been on prednisone taper for back pain  MRI of the lower back was ordered which showed bilateral stenosis with disc bulging in L4-5 and L5-S1 The MRI also showed some marrow lesions. PET scan 09/02/21 did not show spinal lesion but did see possible liver lesion. This was further evaluated with an  MRI Of the abd which did not show a hepatic mass He does have a 59m pulmonary nodule on the left  Feels his back is much better, able to drive and walk after taking  a steroid lumbar injection in June  Has not been eating well, more sugar. Has not bee. able to exercise due to his back pain.  Wt Readings from Last 3 Encounters:  11/14/21 208 lb 12.8 oz (94.7 kg)  09/19/21 207 lb 6.4 oz (94.1 kg)  08/31/21 204 lb (92.5 kg)     Past Medical History:  Diagnosis Date   Asthma    Calcific Achilles tendonitis    bilateral   Controlled type 2 diabetes mellitus without complication (HCC)    DDD (degenerative disc disease), cervical    Gallstone  09/11/2011   GERD (gastroesophageal reflux disease)    Hyperlipidemia    mixed   Insomnia    Mild cognitive impairment    Overweight (BMI 25.0-29.9)    [place]    Pulmonary nodule/lesion, solitary 06/2004   f/u in 2014, 2015 stable in [facility]   Sleep apnea    Slow transit constipation    Vitamin D deficiency     Past Surgical History:  Procedure Laterality Date   CERVICAL FUSION  1999 & 2004   implant metal plate   CHOLECYSTECTOMY  2012   COLONOSCOPY  12/2015   EYE SURGERY  1997   laser vision correction   palatouvulopasty  1995   PROSTATE SURGERY  09/11/2011   prostatectomy for prostate cancer    TONSILLECTOMY     WISDOM TOOTH EXTRACTION      Allergies  Allergen Reactions   Tetracyclines & Related Other (See Comments)    hepatitis   Niacin And Related Other (See Comments)    headache   Wellbutrin [Bupropion] Other (See Comments)    dizziness    Outpatient Encounter Medications as of 11/14/2021  Medication Sig   b complex vitamins capsule Take 1 capsule by mouth daily.   Coenzyme Q10 (CO Q 10) 100 MG CAPS Take 2  capsules by mouth daily.   fexofenadine (ALLEGRA) 60 MG tablet Take 60 mg by mouth daily.   fluocinonide (LIDEX) 0.05 % external solution Apply 1 application. topically 2 (two) times daily as needed.   gabapentin (NEURONTIN) 100 MG capsule Take 1 capsule (100 mg total) by mouth 3 (three) times daily as needed.   metFORMIN (GLUCOPHAGE-XR) 500 MG 24 hr tablet Take 2 tablets (1,000 mg total) by mouth 2 (two) times daily with a meal.   polyethylene glycol (MIRALAX / GLYCOLAX) packet Take 17 g by mouth daily.   sildenafil (VIAGRA) 100 MG tablet Take 1 tablet (100 mg total) by mouth daily as needed for erectile dysfunction.   simvastatin (ZOCOR) 40 MG tablet Take one tablet by mouth once daily in the evening.   traMADol (ULTRAM) 50 MG tablet Take 1 tablet (50 mg total) by mouth every 8 (eight) hours as needed.   Turmeric 500 MG CAPS Take 500 mg by mouth.  Twice a week   Vitamin D, Ergocalciferol, (DRISDOL) 1.25 MG (50000 UNIT) CAPS capsule TAKE 1 CAPSULE ONCE A WEEK.   [DISCONTINUED] sitaGLIPtin (JANUVIA) 25 MG tablet Take 1 tablet (25 mg total) by mouth daily.   No facility-administered encounter medications on file as of 11/14/2021.    Review of Systems:  Review of Systems  Constitutional:  Negative for activity change, appetite change, chills, diaphoresis, fatigue, fever and unexpected weight change.  Respiratory:  Negative for cough, shortness of breath, wheezing and stridor.   Cardiovascular:  Negative for chest pain, palpitations and leg swelling.  Gastrointestinal:  Negative for abdominal distention, abdominal pain, constipation and diarrhea.  Genitourinary:  Negative for difficulty urinating and dysuria.  Musculoskeletal:  Negative for arthralgias, back pain, gait problem, joint swelling and myalgias.  Neurological:  Negative for dizziness, seizures, syncope, facial asymmetry, speech difficulty, weakness and headaches.  Hematological:  Negative for adenopathy. Does not bruise/bleed easily.  Psychiatric/Behavioral:  Negative for agitation, behavioral problems and confusion.     Health Maintenance  Topic Date Due   COVID-19 Vaccine (4 - Booster) 06/08/2020   INFLUENZA VACCINE  12/27/2021   HEMOGLOBIN A1C  02/09/2022   URINE MICROALBUMIN  02/10/2022   FOOT EXAM  11/15/2022   OPHTHALMOLOGY EXAM  11/15/2022   TETANUS/TDAP  05/07/2027   Pneumonia Vaccine 41+ Years old  Completed   Hepatitis C Screening  Completed   Zoster Vaccines- Shingrix  Completed   HPV VACCINES  Aged Out   COLONOSCOPY (Pts 45-4yr Insurance coverage will need to be confirmed)  Discontinued    Physical Exam: Vitals:   11/14/21 1323  BP: 124/78  Pulse: 90  Temp: 97.7 F (36.5 C)  SpO2: 95%  Weight: 208 lb 12.8 oz (94.7 kg)  Height: 5' 11.5" (1.816 m)   Body mass index is 28.72 kg/m. Physical Exam Vitals and nursing note reviewed.   Constitutional:      General: He is not in acute distress.    Appearance: He is not diaphoretic.  HENT:     Head: Normocephalic and atraumatic.  Neck:     Thyroid: No thyromegaly.     Vascular: No JVD.     Trachea: No tracheal deviation.  Cardiovascular:     Rate and Rhythm: Normal rate and regular rhythm.     Pulses:          Dorsalis pedis pulses are 2+ on the right side and 2+ on the left side.       Posterior tibial pulses are 2+ on the right  side and 2+ on the left side.     Heart sounds: No murmur heard. Pulmonary:     Effort: Pulmonary effort is normal. No respiratory distress.     Breath sounds: Normal breath sounds. No wheezing.  Abdominal:     General: Bowel sounds are normal. There is no distension.     Palpations: Abdomen is soft.     Tenderness: There is no abdominal tenderness.  Musculoskeletal:     Right foot: Normal range of motion. No deformity, bunion, Charcot foot or foot drop.     Left foot: Normal range of motion. No deformity, bunion, Charcot foot, foot drop or prominent metatarsal heads.  Feet:     Right foot:     Protective Sensation: 5 sites tested.  5 sites sensed.     Skin integrity: Skin integrity normal.     Toenail Condition: Right toenails are normal.     Left foot:     Protective Sensation: 5 sites tested.  5 sites sensed.     Skin integrity: Skin integrity normal.     Toenail Condition: Left toenails are normal.  Lymphadenopathy:     Cervical: No cervical adenopathy.  Skin:    General: Skin is warm and dry.  Neurological:     Mental Status: He is alert and oriented to person, place, and time.     Cranial Nerves: No cranial nerve deficit.     Labs reviewed: Basic Metabolic Panel: Recent Labs    02/10/21 0000 08/09/21 0000  NA 141 136*  K 4.8 4.8  CL 102 98*  CO2 27* 24*  BUN 11 17  CREATININE 1.1 0.9  CALCIUM 9.8 9.7  TSH  --  4.03   Liver Function Tests: Recent Labs    02/10/21 0000 08/09/21 0000  AST 18 15  ALT 23 24   ALKPHOS 59 52  ALBUMIN 4.8 4.4   No results for input(s): "LIPASE", "AMYLASE" in the last 8760 hours. No results for input(s): "AMMONIA" in the last 8760 hours. CBC: Recent Labs    02/10/21 0000 08/09/21 0000  WBC 6.0 4.6  HGB 14.4 13.4*  HCT 43 40*  PLT 144* 175   Lipid Panel: Recent Labs    08/09/21 0000  CHOL 101  HDL 45  LDLCALC 35  TRIG 105   Lab Results  Component Value Date   HGBA1C 8.1 08/09/2021    Procedures since last visit: No results found.  Assessment/Plan  1. Acute right-sided low back pain with right-sided sciatica Resolved after steroid inj Follow up with Dr Annette Stable as needed.   2. Uncontrolled diabetes mellitus with hyperglycemia, without long-term current use of insulin (HCC) Continue metformin Stop Januvia due to cost Recommend lower carb and lower sugar diet Increasing activity as tolerated 5 x week 30 min F/U in two months for A1C If not trending down would add additional meds.  May have been elevated due to steroid use.    Labs/tests ordered:  * No order type specified *BMP A1C Next appt:  2 months with Dr Lyndel Safe   Total time 46mn:  time greater than 50% of total time spent doing pt counseling and coordination of care

## 2021-11-15 DIAGNOSIS — H52203 Unspecified astigmatism, bilateral: Secondary | ICD-10-CM | POA: Diagnosis not present

## 2021-11-15 DIAGNOSIS — E119 Type 2 diabetes mellitus without complications: Secondary | ICD-10-CM | POA: Diagnosis not present

## 2021-11-15 DIAGNOSIS — Z961 Presence of intraocular lens: Secondary | ICD-10-CM | POA: Diagnosis not present

## 2021-12-07 ENCOUNTER — Ambulatory Visit (INDEPENDENT_AMBULATORY_CARE_PROVIDER_SITE_OTHER): Payer: Medicare Other | Admitting: Pulmonary Disease

## 2021-12-07 ENCOUNTER — Encounter: Payer: Self-pay | Admitting: Pulmonary Disease

## 2021-12-07 VITALS — BP 124/70 | HR 75 | Temp 97.6°F | Ht 71.0 in | Wt 209.2 lb

## 2021-12-07 DIAGNOSIS — Z9989 Dependence on other enabling machines and devices: Secondary | ICD-10-CM | POA: Diagnosis not present

## 2021-12-07 DIAGNOSIS — D869 Sarcoidosis, unspecified: Secondary | ICD-10-CM | POA: Diagnosis not present

## 2021-12-07 DIAGNOSIS — G4733 Obstructive sleep apnea (adult) (pediatric): Secondary | ICD-10-CM

## 2021-12-07 NOTE — Assessment & Plan Note (Signed)
Appears stable. Right lower lobe pulmonary nodule is noted which is chronic

## 2021-12-07 NOTE — Assessment & Plan Note (Signed)
CPAP download was reviewed which shows excellent control of events at 9 cm with minimal leak and great compliance.  He is very compliant and CPAP is only helped improve his daytime somnolence and fatigue.  CPAP supplies will be renewed for a year  Weight loss encouraged, compliance with goal of at least 4-6 hrs every night is the expectation. Advised against medications with sedative side effects Cautioned against driving when sleepy - understanding that sleepiness will vary on a day to day basis

## 2021-12-07 NOTE — Patient Instructions (Signed)
  CPAP is working well at 9 cm

## 2021-12-07 NOTE — Progress Notes (Signed)
   Subjective:    Patient ID: Robert Norman, male    DOB: [birth year], 76 y.o.   MRN: [id number]  HPI   76 yo never smoker for FU of OSA.  He was previously followed with Dr. Heinz Knuckles , [facility] [place].   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   OSA was diagnosed in 2002 , he is on his third machine. On his last visit 09/2020 we provided him with a new CPAP machine and he has adjusted well to this.  He uses nasal pillows. No problems with mask or pressure, he feels well rested and denies daytime somnolence or fatigue he takes an occasional nap and uses his CPAP during his nap.  He denies skin lesions or visual problems.  He was noted to have a liver lesion but MRI abdomen did not show any liver lesions only showed hepatomegaly   Significant tests/ events reviewed   11/2017-PFT- FVC 4.34 (96 predicted), FEF ratio 79, FEV1 3.43 (104 predicted), DLCO 92, patient did not want to complete postbronchodilator testing.  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 Also denies presyncope, palpitations, heartburn, abdominal pain, nausea, vomiting, diarrhea or change in bowel or urinary habits, dysuria,hematuria, rash, arthralgias, visual complaints,  headache, numbness weakness or ataxia.     Objective:   Physical Exam   Gen. Pleasant, well-nourished, in no distress ENT - no thrush, no pallor/icterus,no post nasal drip Neck: No JVD, no thyromegaly, no carotid bruits Lungs: no use of accessory muscles, no dullness to percussion, clear without rales or rhonchi  Cardiovascular: Rhythm regular, heart sounds  normal, no murmurs or gallops, no peripheral edema Musculoskeletal: No deformities, no cyanosis or clubbing       Assessment & Plan:

## 2022-01-10 DIAGNOSIS — E1100 Type 2 diabetes mellitus with hyperosmolarity without nonketotic hyperglycemic-hyperosmolar coma (NKHHC): Secondary | ICD-10-CM | POA: Diagnosis not present

## 2022-01-10 LAB — BASIC METABOLIC PANEL
BUN: 13 (ref 4–21)
CO2: 25 — AB (ref 13–22)
Chloride: 102 (ref 99–108)
Creatinine: 1 (ref 0.6–1.3)
Glucose: 192
Potassium: 5.2 mEq/L — AB (ref 3.5–5.1)
Sodium: 139 (ref 137–147)

## 2022-01-10 LAB — COMPREHENSIVE METABOLIC PANEL
Calcium: 9.9 (ref 8.7–10.7)
eGFR: 74

## 2022-01-10 LAB — HEMOGLOBIN A1C: Hemoglobin A1C: 8

## 2022-01-11 ENCOUNTER — Encounter: Payer: Self-pay | Admitting: Internal Medicine

## 2022-01-18 ENCOUNTER — Encounter: Payer: Medicare Other | Admitting: Internal Medicine

## 2022-01-25 ENCOUNTER — Non-Acute Institutional Stay: Payer: Medicare Other | Admitting: Internal Medicine

## 2022-01-25 ENCOUNTER — Encounter: Payer: Self-pay | Admitting: Internal Medicine

## 2022-01-25 ENCOUNTER — Other Ambulatory Visit: Payer: Self-pay

## 2022-01-25 VITALS — BP 139/78 | HR 68 | Temp 97.5°F | Ht 71.0 in | Wt 211.8 lb

## 2022-01-25 DIAGNOSIS — K5909 Other constipation: Secondary | ICD-10-CM

## 2022-01-25 DIAGNOSIS — E782 Mixed hyperlipidemia: Secondary | ICD-10-CM | POA: Diagnosis not present

## 2022-01-25 DIAGNOSIS — N529 Male erectile dysfunction, unspecified: Secondary | ICD-10-CM | POA: Diagnosis not present

## 2022-01-25 DIAGNOSIS — E119 Type 2 diabetes mellitus without complications: Secondary | ICD-10-CM

## 2022-01-25 DIAGNOSIS — E875 Hyperkalemia: Secondary | ICD-10-CM

## 2022-01-25 DIAGNOSIS — G4730 Sleep apnea, unspecified: Secondary | ICD-10-CM

## 2022-01-25 DIAGNOSIS — E1165 Type 2 diabetes mellitus with hyperglycemia: Secondary | ICD-10-CM | POA: Diagnosis not present

## 2022-01-25 DIAGNOSIS — D869 Sarcoidosis, unspecified: Secondary | ICD-10-CM

## 2022-01-25 MED ORDER — METFORMIN HCL ER 500 MG PO TB24: 1000.0000 mg | ORAL_TABLET | Freq: Two times a day (BID) | ORAL | 1 refills | Status: DC

## 2022-01-25 MED ORDER — GLIMEPIRIDE 1 MG PO TABS: 1.0000 mg | ORAL_TABLET | Freq: Every day | ORAL | 3 refills | Status: DC

## 2022-01-25 NOTE — Telephone Encounter (Signed)
Per Dr. Adonis Brook to stay the same and ok to Refill.

## 2022-01-25 NOTE — Telephone Encounter (Signed)
Incoming refill request received via fax from [facility] for metformin ER 500 mg twice daily. Patient is currently seeing Dr.Gupta at [facility] clinic and she will decide if patient to continue medication at current dose and instructions.  Side note: A1c was 8.0

## 2022-01-25 NOTE — Patient Instructions (Signed)
Check CBG once a week

## 2022-01-25 NOTE — Progress Notes (Signed)
Location:  [place] of Service:  Clinic (12)  Provider:   Code Status: Full Code Goals of Care:     01/25/2022    9:35 AM  Advanced Directives  Does Patient Have a Medical Advance Directive? Yes  Type of Advance Directive [place]  Does patient want to make changes to medical advance directive? No - Patient declined  Copy of [place] in Chart? Yes - validated most recent copy scanned in chart (See row information)     Chief Complaint  Patient presents with   Medical Management of Chronic Issues    Two month follow up.    HPI: Patient is a 76 y.o. male seen today for medical management of chronic diseases.    Patient has h/o Diabetes Mellitus type 2  A1C Elevated still  Doing diet control Did not start Januvia due to cost  Sarcoidosis Diagnosed with Lymph node Biopsy Not on Active treatment Follows with Pulmonology Does have Prednisone Prescription PRN if needed Recent PET scan was negative   H/o Sleep Apnea on CPAP   Liver Lesion MR Abdomen was negative  Lower back pain Has come back again Planning to go to Ortho to get it injected   Has not lost weight Widowed Wife died due to Colon Cancer 7 years ago Has sons and grandsons   Past Medical History:  Diagnosis Date   Asthma    Calcific Achilles tendonitis    bilateral   Controlled type 2 diabetes mellitus without complication (HCC)    DDD (degenerative disc disease), cervical    Gallstone 09/11/2011   GERD (gastroesophageal reflux disease)    Hyperlipidemia    mixed   Insomnia    Mild cognitive impairment    Overweight (BMI 25.0-29.9)    Prostate cancer ([place])    Pulmonary nodule/lesion, solitary 06/2004   f/u in 2014, 2015 stable in [facility]   Sleep apnea    Slow transit constipation    Vitamin D deficiency     Past Surgical History:  Procedure Laterality Date   CERVICAL FUSION  1999 & 2004   implant metal plate    CHOLECYSTECTOMY  2012   COLONOSCOPY  12/2015   EYE SURGERY  1997   laser vision correction   palatouvulopasty  1995   PROSTATE SURGERY  09/11/2011   prostatectomy for prostate cancer    TONSILLECTOMY     WISDOM TOOTH EXTRACTION      Allergies  Allergen Reactions   Tetracyclines & Related Other (See Comments)    hepatitis   Niacin And Related Other (See Comments)    headache   Wellbutrin [Bupropion] Other (See Comments)    dizziness    Outpatient Encounter Medications as of 01/25/2022  Medication Sig   b complex vitamins capsule Take 1 capsule by mouth daily.   Coenzyme Q10 (CO Q 10) 100 MG CAPS Take 2 capsules by mouth daily.   fexofenadine (ALLEGRA) 60 MG tablet Take 60 mg by mouth daily.   fluocinonide (LIDEX) 0.05 % external solution Apply 1 application. topically 2 (two) times daily as needed.   glimepiride (AMARYL) 1 MG tablet Take 1 tablet (1 mg total) by mouth daily with breakfast.   metFORMIN (GLUCOPHAGE-XR) 500 MG 24 hr tablet Take 2 tablets (1,000 mg total) by mouth 2 (two) times daily with a meal.   polyethylene glycol (MIRALAX / GLYCOLAX) packet Take 17 g by mouth daily.   sildenafil (VIAGRA) 100 MG tablet  Take 1 tablet (100 mg total) by mouth daily as needed for erectile dysfunction.   simvastatin (ZOCOR) 40 MG tablet Take one tablet by mouth once daily in the evening.   Turmeric 500 MG CAPS Take 500 mg by mouth. Twice a week   Vitamin D, Ergocalciferol, (DRISDOL) 1.25 MG (50000 UNIT) CAPS capsule TAKE 1 CAPSULE ONCE A WEEK.   [DISCONTINUED] metFORMIN (GLUCOPHAGE-XR) 500 MG 24 hr tablet Take 2 tablets (1,000 mg total) by mouth 2 (two) times daily with a meal.   No facility-administered encounter medications on file as of 01/25/2022.    Review of Systems:  Review of Systems  Constitutional:  Negative for activity change, appetite change and unexpected weight change.  HENT: Negative.    Respiratory:  Negative for cough and shortness of breath.   Cardiovascular:   Negative for leg swelling.  Gastrointestinal:  Negative for constipation.  Genitourinary:  Negative for frequency.  Musculoskeletal:  Positive for back pain. Negative for arthralgias, gait problem and myalgias.  Skin: Negative.  Negative for rash.  Neurological:  Negative for dizziness and weakness.  Psychiatric/Behavioral:  Negative for confusion and sleep disturbance.   All other systems reviewed and are negative.   Health Maintenance  Topic Date Due   INFLUENZA VACCINE  12/27/2021   URINE MICROALBUMIN  02/10/2022   COVID-19 Vaccine (4 - Mixed Product risk series) 02/13/2023 (Originally 06/08/2020)   HEMOGLOBIN A1C  07/13/2022   FOOT EXAM  11/15/2022   OPHTHALMOLOGY EXAM  11/15/2022   TETANUS/TDAP  05/07/2027   Pneumonia Vaccine 51+ Years old  Completed   Hepatitis C Screening  Completed   Zoster Vaccines- Shingrix  Completed   HPV VACCINES  Aged Out   COLONOSCOPY (Pts 45-83yr Insurance coverage will need to be confirmed)  Discontinued    Physical Exam: Vitals:   01/25/22 0922  BP: 139/78  Pulse: 68  Temp: (!) 97.5 F (36.4 C)  SpO2: 96%  Weight: 211 lb 12.8 oz (96.1 kg)  Height: '5\' 11"'$  (1.803 m)   Body mass index is 29.54 kg/m. Physical Exam Vitals reviewed.  Constitutional:      Appearance: Normal appearance.  HENT:     Head: Normocephalic.     Right Ear: Tympanic membrane normal.     Left Ear: Tympanic membrane normal.     Ears:     Comments: Mild Wax    Nose: Nose normal.     Mouth/Throat:     Mouth: Mucous membranes are moist.     Pharynx: Oropharynx is clear.  Eyes:     Pupils: Pupils are equal, round, and reactive to light.  Cardiovascular:     Rate and Rhythm: Normal rate and regular rhythm.     Pulses: Normal pulses.     Heart sounds: No murmur heard. Pulmonary:     Effort: Pulmonary effort is normal. No respiratory distress.     Breath sounds: Normal breath sounds. No rales.  Abdominal:     General: Abdomen is flat. Bowel sounds are normal.      Palpations: Abdomen is soft.  Musculoskeletal:        General: No swelling.     Cervical back: Neck supple.  Skin:    General: Skin is warm.  Neurological:     General: No focal deficit present.     Mental Status: He is alert and oriented to person, place, and time.  Psychiatric:        Mood and Affect: Mood normal.  Thought Content: Thought content normal.     Labs reviewed: Basic Metabolic Panel: Recent Labs    02/10/21 0000 08/09/21 0000 01/10/22 0000  NA 141 136* 139  K 4.8 4.8 5.2*  CL 102 98* 102  CO2 27* 24* 25*  BUN '11 17 13  '$ CREATININE 1.1 0.9 1.0  CALCIUM 9.8 9.7 9.9  TSH  --  4.03  --    Liver Function Tests: Recent Labs    02/10/21 0000 08/09/21 0000  AST 18 15  ALT 23 24  ALKPHOS 59 52  ALBUMIN 4.8 4.4   No results for input(s): "LIPASE", "AMYLASE" in the last 8760 hours. No results for input(s): "AMMONIA" in the last 8760 hours. CBC: Recent Labs    02/10/21 0000 08/09/21 0000  WBC 6.0 4.6  HGB 14.4 13.4*  HCT 43 40*  PLT 144* 175   Lipid Panel: Recent Labs    08/09/21 0000  CHOL 101  HDL 45  LDLCALC 35  TRIG 105   Lab Results  Component Value Date   HGBA1C 8.0 01/10/2022    Procedures since last visit: No results found.  Assessment/Plan 1. Uncontrolled diabetes mellitus with hyperglycemia, without long-term current use of insulin (HCC) Start Amaryl  Discussed about the side effects and not skipping meals with risk of Hypoglycemia Already on Metformin Check CBGS q weekly 2. Sarcoidosis Follows with Pulmonary Stable   3. Mixed hyperlipidemia On Zocor  LDL 35  4. Sleep apnea, unspecified type CPAP  5. Vasculogenic erectile dysfunction, unspecified vasculogenic erectile dysfunction type Viagra PRn   6  Serum potassium elevated Repeat BMP 7 Low back pain Will Follow with Ortho 8 Vit D Def On High doses of Vit D Last level was good in 03/23 Labs/tests ordered:  BMP  Next appt:  03/13/2022

## 2022-01-26 DIAGNOSIS — M5416 Radiculopathy, lumbar region: Secondary | ICD-10-CM | POA: Diagnosis not present

## 2022-01-26 DIAGNOSIS — M4310 Spondylolisthesis, site unspecified: Secondary | ICD-10-CM | POA: Diagnosis not present

## 2022-01-31 DIAGNOSIS — H903 Sensorineural hearing loss, bilateral: Secondary | ICD-10-CM | POA: Diagnosis not present

## 2022-01-31 DIAGNOSIS — H6123 Impacted cerumen, bilateral: Secondary | ICD-10-CM | POA: Diagnosis not present

## 2022-02-01 ENCOUNTER — Telehealth: Payer: Self-pay

## 2022-02-01 NOTE — Telephone Encounter (Signed)
Patient called and left voicemail message on clinical intake voicemail for Royal Hawthorn, NP, stating that he has up coming appointment. He stated that he is due to have bloodwork done before appointment. He says he may not need all the blood tests. Patient says that one test is for elevated potassium and he doesn't need that because he has been walking daily and stopping and picking and eating about 1/2 dozen figs daily so that's why his potassium is high.  Message routed to Royal Hawthorn, NP

## 2022-02-02 NOTE — Telephone Encounter (Signed)
You can let him know that I have discussed this with Nira Conn the clinic nurse and she will discontinue the lab order. He needs to check his blood sugar fasting as instructed by Dr. Lyndel Safe and bring those readings to his next apt. Thanks.

## 2022-02-03 NOTE — Telephone Encounter (Signed)
Patient notified and agreed.  

## 2022-02-03 NOTE — Telephone Encounter (Signed)
LMOM for patient to return call.

## 2022-02-20 DIAGNOSIS — M5416 Radiculopathy, lumbar region: Secondary | ICD-10-CM | POA: Diagnosis not present

## 2022-03-13 ENCOUNTER — Encounter: Payer: Self-pay | Admitting: Adult Health

## 2022-03-13 ENCOUNTER — Non-Acute Institutional Stay: Payer: Medicare Other | Admitting: Adult Health

## 2022-03-13 VITALS — BP 132/74 | HR 71 | Temp 97.7°F | Resp 18 | Ht 71.0 in | Wt 209.0 lb

## 2022-03-13 DIAGNOSIS — E663 Overweight: Secondary | ICD-10-CM | POA: Diagnosis not present

## 2022-03-13 DIAGNOSIS — E1165 Type 2 diabetes mellitus with hyperglycemia: Secondary | ICD-10-CM | POA: Diagnosis not present

## 2022-03-13 MED ORDER — LINAGLIPTIN 5 MG PO TABS: 5.0000 mg | ORAL_TABLET | Freq: Every day | ORAL | 1 refills | Status: DC

## 2022-03-13 NOTE — Patient Instructions (Addendum)
We are prescribing Tradjenta to help with your blood sugar.  Please stop glimepiride   Come back in 6 weeks for blood work  and apt with Dr Lyndel Safe  Bring blood sugar log with you    The other drug in class recommended for you would be Januvia (which was too high cost under your insurance plan.

## 2022-03-13 NOTE — Progress Notes (Signed)
Location:  [facility]  POS: Clinic  Provider: Royal Hawthorn, ANP  Code Status:  Goals of Care:     03/13/2022    2:08 PM  Advanced Directives  Does Patient Have a Medical Advance Directive? Yes  Type of Advance Directive [place]  Does patient want to make changes to medical advance directive? No - Patient declined  Copy of [place] in Chart? Yes - validated most recent copy scanned in chart (See row information)     Chief Complaint  Patient presents with   Medical Management of Chronic Issues    2 month follow up.   Immunizations    Discussed the need for flu vaccine.    HPI: Patient is a 76 y.o. male seen today for follow up regarding type 2 DM.  He was started Amaryl by Dr Lyndel Safe 01/25/22, Januvia was ordered first but not filled due to cost.   A1C 8.0 01/10/22. No foot ulcers, foot pain or numbness.   CBG 66, 72, 85 prior to eating supper. He is feeling shaky or light headed at times. Concern for low numbers.   Some readings are 100-150, rare occasional readings over 200.   He has been on prednisone in the past year for back pain and now is getting steroid injections for his pain every 3 months. He tried to go longer than every three months but was in too much pain. Since he has had back pain he is eating more and working out less.   Past Medical History:  Diagnosis Date   Asthma    Calcific Achilles tendonitis    bilateral   Controlled type 2 diabetes mellitus without complication (HCC)    DDD (degenerative disc disease), cervical    Gallstone 09/11/2011   GERD (gastroesophageal reflux disease)    Hyperlipidemia    mixed   Insomnia    Mild cognitive impairment    Overweight (BMI 25.0-29.9)    Prostate cancer ([place])    Pulmonary nodule/lesion, solitary 06/2004   f/u in 2014, 2015 stable in [facility]   Sleep apnea    Slow transit constipation    Vitamin D deficiency     Past Surgical History:  Procedure Laterality  Date   CERVICAL FUSION  1999 & 2004   implant metal plate   CHOLECYSTECTOMY  2012   COLONOSCOPY  12/2015   EYE SURGERY  1997   laser vision correction   palatouvulopasty  1995   PROSTATE SURGERY  09/11/2011   prostatectomy for prostate cancer    TONSILLECTOMY     WISDOM TOOTH EXTRACTION      Allergies  Allergen Reactions   Tetracyclines & Related Other (See Comments)    hepatitis   Niacin And Related Other (See Comments)    headache   Wellbutrin [Bupropion] Other (See Comments)    dizziness    Outpatient Encounter Medications as of 03/13/2022  Medication Sig   b complex vitamins capsule Take 1 capsule by mouth daily.   Coenzyme Q10 (CO Q 10) 100 MG CAPS Take 2 capsules by mouth daily.   fexofenadine (ALLEGRA) 60 MG tablet Take 60 mg by mouth daily.   fluocinonide (LIDEX) 0.05 % external solution Apply 1 application. topically 2 (two) times daily as needed.   glimepiride (AMARYL) 1 MG tablet Take 1 tablet (1 mg total) by mouth daily with breakfast.   metFORMIN (GLUCOPHAGE-XR) 500 MG 24 hr tablet Take 2 tablets (1,000 mg total) by mouth 2 (two) times daily  with a meal.   polyethylene glycol (MIRALAX / GLYCOLAX) packet Take 17 g by mouth daily.   sildenafil (VIAGRA) 100 MG tablet Take 1 tablet (100 mg total) by mouth daily as needed for erectile dysfunction.   simvastatin (ZOCOR) 40 MG tablet Take one tablet by mouth once daily in the evening.   Turmeric 500 MG CAPS Take 500 mg by mouth. Twice a week   Vitamin D, Ergocalciferol, (DRISDOL) 1.25 MG (50000 UNIT) CAPS capsule TAKE 1 CAPSULE ONCE A WEEK.   No facility-administered encounter medications on file as of 03/13/2022.    Review of Systems:  Review of Systems  Constitutional:  Positive for activity change (due to back issues.) and appetite change (due to back issues, meds.). Negative for chills and diaphoresis.       Feels shaky  when bp low  Respiratory:  Negative for cough and shortness of breath.   Cardiovascular:   Negative for chest pain and leg swelling.  Musculoskeletal:  Positive for back pain.  Skin:  Negative for wound.  Neurological:  Negative for tremors and weakness.  Psychiatric/Behavioral:  Negative for confusion.     Health Maintenance  Topic Date Due   INFLUENZA VACCINE  12/27/2021   Diabetic kidney evaluation - Urine ACR  02/10/2022   COVID-19 Vaccine (4 - Mixed Product risk series) 02/13/2023 (Originally 06/08/2020)   HEMOGLOBIN A1C  07/13/2022   FOOT EXAM  11/15/2022   OPHTHALMOLOGY EXAM  11/15/2022   Diabetic kidney evaluation - GFR measurement  01/11/2023   TETANUS/TDAP  05/07/2027   Pneumonia Vaccine 91+ Years old  Completed   Hepatitis C Screening  Completed   Zoster Vaccines- Shingrix  Completed   HPV VACCINES  Aged Out   COLONOSCOPY (Pts 45-4yr Insurance coverage will need to be confirmed)  Discontinued    Physical Exam: Vitals:   03/13/22 1410  BP: 132/74  Pulse: 71  Resp: 18  Temp: 97.7 F (36.5 C)  TempSrc: Temporal  SpO2: 97%  Weight: 209 lb (94.8 kg)  Height: '5\' 11"'$  (1.803 m)   Body mass index is 29.15 kg/m.  Wt Readings from Last 3 Encounters:  03/13/22 209 lb (94.8 kg)  01/25/22 211 lb 12.8 oz (96.1 kg)  12/07/21 209 lb 3.2 oz (94.9 kg)    Physical Exam Vitals reviewed.  Cardiovascular:     Rate and Rhythm: Normal rate and regular rhythm.  Pulmonary:     Effort: Pulmonary effort is normal.     Breath sounds: Normal breath sounds.  Musculoskeletal:     Right lower leg: No edema.     Left lower leg: No edema.  Neurological:     General: No focal deficit present.     Mental Status: Mental status is at baseline.     Labs reviewed: Basic Metabolic Panel: Recent Labs    08/09/21 0000 01/10/22 0000  NA 136* 139  K 4.8 5.2*  CL 98* 102  CO2 24* 25*  BUN 17 13  CREATININE 0.9 1.0  CALCIUM 9.7 9.9  TSH 4.03  --    Liver Function Tests: Recent Labs    08/09/21 0000  AST 15  ALT 24  ALKPHOS 52  ALBUMIN 4.4   No results for  input(s): "LIPASE", "AMYLASE" in the last 8760 hours. No results for input(s): "AMMONIA" in the last 8760 hours. CBC: Recent Labs    08/09/21 0000  WBC 4.6  HGB 13.4*  HCT 40*  PLT 175   Lipid Panel: Recent Labs  08/09/21 0000  CHOL 101  HDL 45  LDLCALC 35  TRIG 105   Lab Results  Component Value Date   HGBA1C 8.0 01/10/2022    Procedures since last visit: No results found.  Assessment/Plan  1. Uncontrolled diabetes mellitus with hyperglycemia, without long-term current use of insulin ([place]) Stop amaryl Start tradjenta (should be less per epic in cost), will address if cost is an issue  Continue metformin A1C, BMP, and microalbumin   2. Overweight (BMI 25.0-29.9) Recommend reduced portions, increased activity    Labs/tests ordered:  * No order type specified *A1C, BMP, and microalbumin  Next appt:  04/12/2022   Total time 73mn:  time greater than 50% of total time spent doing pt counseling and coordination of care    Recommend flu vaccine

## 2022-03-14 DIAGNOSIS — Z23 Encounter for immunization: Secondary | ICD-10-CM | POA: Diagnosis not present

## 2022-04-12 ENCOUNTER — Ambulatory Visit (INDEPENDENT_AMBULATORY_CARE_PROVIDER_SITE_OTHER): Payer: Medicare Other | Admitting: Family

## 2022-04-12 ENCOUNTER — Encounter: Payer: Self-pay | Admitting: Family

## 2022-04-12 DIAGNOSIS — Z0000 Encounter for general adult medical examination without abnormal findings: Secondary | ICD-10-CM

## 2022-04-12 NOTE — Progress Notes (Signed)
Subjective:   Robert Norman is a 76 y.o. male who presents for Medicare Annual/Subsequent preventive examination.  Review of Systems     Cardiac Risk Factors include: advanced age (>71mn, >>60women);diabetes mellitus;male gender     Objective:    There were no vitals filed for this visit. There is no height or weight on file to calculate BMI.     04/12/2022    1:49 PM 03/13/2022    2:08 PM 01/25/2022    9:35 AM 04/05/2021    1:35 PM 08/11/2020    9:33 AM 04/14/2020    8:39 AM 04/02/2020    2:01 PM  Advanced Directives  Does Patient Have a Medical Advance Directive? Yes Yes Yes Yes Yes Yes Yes  Type of AArts administratorPower of ADennisof Attorney Living will;Healthcare Power of AHobergof [place] Does patient want to make changes to medical advance directive? No - Patient declined No - Patient declined No - Patient declined No - Patient declined No - Patient declined No - Patient declined No - Patient declined  Copy of HPrestonin Chart? Yes - validated most recent copy scanned in chart (See row information) Yes - validated most recent copy scanned in chart (See row information) Yes - validated most recent copy scanned in chart (See row information) Yes - validated most recent copy scanned in chart (See row information)   Yes - validated most recent copy scanned in chart (See row information)    Current Medications (verified) Outpatient Encounter Medications as of 04/12/2022  Medication Sig   b complex vitamins capsule Take 1 capsule by mouth daily.   Coenzyme Q10 (CO Q 10) 100 MG CAPS Take 2 capsules by mouth daily.   fexofenadine (ALLEGRA) 60 MG tablet Take 60 mg by mouth daily.   fluocinonide (LIDEX) 0.05 % external solution Apply 1 application. topically 2 (two) times daily as needed.   linagliptin (TRADJENTA) 5 MG  TABS tablet Take 1 tablet (5 mg total) by mouth daily.   metFORMIN (GLUCOPHAGE-XR) 500 MG 24 hr tablet Take 2 tablets (1,000 mg total) by mouth 2 (two) times daily with a meal.   polyethylene glycol (MIRALAX / GLYCOLAX) packet Take 17 g by mouth daily.   sildenafil (VIAGRA) 100 MG tablet Take 1 tablet (100 mg total) by mouth daily as needed for erectile dysfunction.   simvastatin (ZOCOR) 40 MG tablet Take one tablet by mouth once daily in the evening.   Turmeric 500 MG CAPS Take 500 mg by mouth. Twice a week   Vitamin D, Ergocalciferol, (DRISDOL) 1.25 MG (50000 UNIT) CAPS capsule TAKE 1 CAPSULE ONCE A WEEK.   No facility-administered encounter medications on file as of 04/12/2022.    Allergies (verified) Tetracyclines & related, Niacin and related, and Wellbutrin [bupropion]   History: Past Medical History:  Diagnosis Date   Asthma    Calcific Achilles tendonitis    bilateral   Controlled type 2 diabetes mellitus without complication (HCC)    DDD (degenerative disc disease), cervical    Gallstone 09/11/2011   GERD (gastroesophageal reflux disease)    Hyperlipidemia    mixed   Insomnia    Mild cognitive impairment    Overweight (BMI 25.0-29.9)    [place]    Pulmonary nodule/lesion, solitary 06/2004   f/u in 2014, 2015 stable in [facility]   Sleep apnea  Slow transit constipation    Vitamin D deficiency    Past Surgical History:  Procedure Laterality Date   CERVICAL FUSION  1999 & 2004   implant metal plate   CHOLECYSTECTOMY  2012   COLONOSCOPY  12/2015   EYE SURGERY  1997   laser vision correction   palatouvulopasty  1995   PROSTATE SURGERY  09/11/2011   prostatectomy for prostate cancer    TONSILLECTOMY     WISDOM TOOTH EXTRACTION     Family History  Problem Relation Age of Onset   Cancer Mother    Heart disease Father    Heart disease Brother    Diabetes Brother    Colon cancer Neg Hx    Rectal cancer Neg Hx    Stomach cancer Neg Hx    Esophageal  cancer Neg Hx    Social History   Socioeconomic History   Marital status: Widowed    Spouse name: Not on file   Number of children: Not on file   Years of education: Not on file   Highest education level: Not on file  Occupational History   Occupation: accounting    Comment: Agricultural consultant  Tobacco Use   Smoking status: Never   Smokeless tobacco: Never  Vaping Use   Vaping Use: Never used  Substance and Sexual Activity   Alcohol use: Not Currently   Drug use: No   Sexual activity: Yes  Other Topics Concern   Not on file  Social History Narrative   Tobacco use, amount per day now: NEVER USED   Past tobacco use, amount per day: NEVER USED   How many years did you use tobacco: NONE   Alcohol use (drinks per week): 10 GLASSES OF WINE A WEEK   Diet:   Do you drink/eat things with caffeine: YES   Marital status:  WIDOWED                    What year were you married?   Do you live in a house, apartment, assisted living, condo, trailer, etc.? APARTMENT   Is it one or more stories? ONE STORY   How many persons live in your home? NONE   Do you have pets in your home?( please list) NO   Current or past profession: FINANCIAL   Do you exercise?  YES                                Type and how often? WALKING 1-2 HOURS   Do you have a living will? YES   Do you have a DNR form?    NO                               If not, do you want to discuss one?   Do you have signed POA/HPOA forms?   YES                     If so, please bring to you appointment   Social Determinants of Health   Financial Resource Strain: Low Risk  (08/08/2017)   Overall Financial Resource Strain (CARDIA)    Difficulty of Paying Living Expenses: Not hard at all  Food Insecurity: No Food Insecurity (12/04/2017)   Hunger Vital Sign    Worried About Running Out of Food in the Last Year: Never true  Ran Out of Food in the Last Year: Never true  Transportation Needs: No Transportation Needs (08/08/2017)    PRAPARE - Hydrologist (Medical): No    Lack of Transportation (Non-Medical): No  Physical Activity: Sufficiently Active (08/08/2017)   Exercise Vital Sign    Days of Exercise per Week: 7 days    Minutes of Exercise per Session: 60 min  Stress: No Stress Concern Present (12/04/2017)   [place]    Feeling of Stress : Not at all  Social Connections: Somewhat Isolated (12/04/2017)   Social Connection and Isolation Panel [NHANES]    Frequency of Communication with Friends and Family: More than three times a week    Frequency of Social Gatherings with Friends and Family: More than three times a week    Attends Religious Services: More than 4 times per year    Active Member of [employer] or Organizations: No    Attends Archivist Meetings: Never    Marital Status: Widowed    Tobacco Counseling Counseling given: Not Answered   Clinical Intake:  Pre-visit preparation completed: No  Pain : No/denies pain     BMI - recorded: 29.15 Nutritional Status: BMI 25 -29 Overweight Nutritional Risks: None Diabetes: Yes CBG done?: No (lancet broke has ordered) Did pt. bring in CBG monitor from home?: Yes (recall 110's - 150's) Glucose Meter Downloaded?: No  How often do you need to have someone help you when you read instructions, pamphlets, or other written materials from your doctor or pharmacy?: 1 - Never What is the last grade level you completed in school?: 18 yrs  Diabetic?yes  Interpreter Needed?: No      Activities of Daily Living    04/12/2022    3:41 PM  In your present state of health, do you have any difficulty performing the following activities:  Vision? 0  Difficulty concentrating or making decisions? 1  Comment [place]  Walking or climbing stairs? 0  Dressing or bathing? 0  Doing errands, shopping? 0  Preparing Food and eating ? N  Using the Toilet? N  In the  past six months, have you accidently leaked urine? N  Do you have problems with loss of bowel control? N  Managing your Medications? N  Managing your Finances? N  Housekeeping or managing your Housekeeping? Y  Comment Housekeeper x 1 week    Patient Care Team: Virgie Dad, MD as PCP - General (Internal Medicine) Christain [place], Effie as Referring Physician (Optometry)  Indicate any recent Medical Services you may have received from other than [facility] providers in the past year (date may be approximate).     Assessment:   This is a routine wellness examination for [place].  Hearing/Vision screen No results found.  Dietary issues and exercise activities discussed: Current Exercise Habits: Home exercise routine, Type of exercise: walking, Time (Minutes): 45, Frequency (Times/Week): 5, Weekly Exercise (Minutes/Week): 225, Intensity: Moderate, Exercise limited by: None identified   Goals Addressed             This Visit's Progress    Weight (lb) < 200 lb (90.7 kg)       Would like to get to < 200 lbs        Depression Screen    04/12/2022    1:51 PM 03/13/2022    2:08 PM 01/25/2022   11:23 AM 11/11/2021    4:15 PM 04/05/2021    1:30  PM 04/14/2020    8:39 AM 04/02/2020    1:54 PM  PHQ 2/9 Scores  PHQ - 2 Score 0 0 0 0 0 0 0  PHQ- 9 Score 0        Exception Documentation Other- indicate reason in comment box  Patient refusal      Not completed AWV          Fall Risk    04/12/2022    1:50 PM 03/13/2022    2:08 PM 01/25/2022   11:22 AM 11/11/2021    4:15 PM 08/31/2021    9:42 AM  Fall Risk   Falls in the past year? 0 0 0 0 0  Number falls in past yr: 0 0 0 0 0  Injury with Fall? 0 0  0 0  Risk for fall due to : No Fall Risks No Fall Risks  Impaired balance/gait No Fall Risks  Follow up Falls evaluation completed Falls evaluation completed  Falls evaluation completed Falls evaluation completed    [place]:  Any stairs in or around  the home? No  If so, are there any without handrails? No  Home free of loose throw rugs in walkways, pet beds, electrical cords, etc? No  Adequate lighting in your home to reduce risk of falls? Yes   ASSISTIVE DEVICES UTILIZED TO PREVENT FALLS:  Life alert? Yes  Use of a cane, walker or w/c? No  Grab bars in the bathroom? Yes  Shower chair or bench in shower? Yes  Elevated toilet seat or a handicapped toilet? Yes   TIMED UP AND GO:  Was the test performed? No .  Length of time to ambulate 10 feet: N/A sec.   Gait steady and fast without use of assistive device  Cognitive Function:    12/04/2017   10:09 AM  MMSE - Mini Mental State Exam  Orientation to time 4  Orientation to Place 5  Registration 3  Attention/ Calculation 5  Recall 2  Language- name 2 objects 2  Language- repeat 1  Language- follow 3 step command 3  Language- read & follow direction 1  Write a sentence 1  Copy design 1  Total score 28        04/12/2022    2:04 PM 04/05/2021    1:31 PM 04/02/2020    1:56 PM 04/02/2019   10:17 AM  6CIT Screen  What Year? 0 points 0 points 0 points 0 points  What month? 0 points 0 points 0 points 0 points  What time? 0 points 0 points 0 points 0 points  Count back from 20 0 points 0 points 4 points 0 points  Months in reverse 0 points 0 points 0 points 0 points  Repeat phrase 0 points 0 points 0 points 4 points  Total Score 0 points 0 points 4 points 4 points    Immunizations Immunization History  Administered Date(s) Administered   Influenza, High Dose Seasonal PF 03/04/2018, 03/07/2019, 03/04/2020, 04/13/2020, 03/23/2021   Influenza-Unspecified 03/12/2014, 03/02/2016, 02/26/2017, 03/21/2022   Moderna Sars-Covid-2 Vaccination 04/13/2020   PFIZER(Purple Top)SARS-COV-2 Vaccination 05/31/2019, 06/18/2019   Pneumococcal Conjugate-13 08/04/2016   Pneumococcal Polysaccharide-23 04/05/2010, 04/17/2019   Td 05/06/2017   Tdap 03/12/2014   Zoster Recombinat (Shingrix)  01/31/2017, 02/26/2017, 03/29/2017   Zoster, Live 07/19/2007    TDAP status: Up to date  Flu Vaccine status: Up to date  Pneumococcal vaccine status: Up to date  Covid-19 vaccine status: Declined, Education has  been provided regarding the importance of this vaccine but patient still declined. Advised may receive this vaccine at local pharmacy or Health Dept.or vaccine clinic. Aware to provide a copy of the vaccination record if obtained from local pharmacy or Health Dept. Verbalized acceptance and understanding.  Qualifies for Shingles Vaccine? Yes   Zostavax completed Yes   Shingrix Completed?: Yes  Screening Tests Health Maintenance  Topic Date Due   Diabetic kidney evaluation - Urine ACR  02/10/2022   COVID-19 Vaccine (4 - Mixed Product risk series) 02/13/2023 (Originally 06/08/2020)   HEMOGLOBIN A1C  07/13/2022   FOOT EXAM  11/15/2022   OPHTHALMOLOGY EXAM  11/15/2022   Diabetic kidney evaluation - GFR measurement  01/11/2023   Medicare Annual Wellness (AWV)  04/13/2023   TETANUS/TDAP  05/07/2027   Pneumonia Vaccine 2+ Years old  Completed   INFLUENZA VACCINE  Completed   Hepatitis C Screening  Completed   Zoster Vaccines- Shingrix  Completed   HPV VACCINES  Aged Out   COLONOSCOPY (Pts 45-61yr Insurance coverage will need to be confirmed)  Discontinued    Health Maintenance  Health Maintenance Due  Topic Date Due   Diabetic kidney evaluation - Urine ACR  02/10/2022    Colorectal cancer screening: No longer required.   Lung Cancer Screening: (Low Dose CT Chest recommended if Age 76-80years, 30 pack-year currently smoking OR have quit w/in 15years.) does not qualify.   Lung Cancer Screening Referral: No   Additional Screening:  Hepatitis C Screening: does not qualify; Completed Yes   Vision Screening: Recommended annual ophthalmology exams for early detection of glaucoma and other disorders of the eye. Is the patient up to date with their annual eye exam?  Yes   Who is the provider or what is the name of the office in which the patient attends annual eye exams[facility]  If pt is not established with a provider, would they like to be referred to a provider to establish care? No .   Dental Screening: Recommended annual dental exams for proper oral hygiene  Community Resource Referral / Chronic Care Management: CRR required this visit?  No   CCM required this visit?  No      Plan:     I have personally reviewed and noted the following in the patient's chart:   Medical and social history Use of alcohol, tobacco or illicit drugs  Current medications and supplements including opioid prescriptions. Patient is not currently taking opioid prescriptions. Functional ability and status Nutritional status Physical activity Advanced directives List of other physicians Hospitalizations, surgeries, and ER visits in previous 12 months Vitals Screenings to include cognitive, depression, and falls Referrals and appointments  In addition, I have reviewed and discussed with patient certain preventive protocols, quality metrics, and best practice recommendations. A written personalized care plan for preventive services as well as general preventive health recommendations were provided to patient.    DSandrea Hughs NP   04/12/2022   Nurse Notes: Due for urine microalbumin on Next visit

## 2022-04-12 NOTE — Patient Instructions (Addendum)
[name] , Thank you for taking time to come for your Medicare Wellness Visit. I appreciate your ongoing commitment to your health goals. Please review the following plan we discussed and let me know if I can assist you in the future.   Screening recommendations/referrals: Colonoscopy : N/A Recommended yearly ophthalmology/optometry visit for glaucoma screening and checkup Recommended yearly dental visit for hygiene and checkup  Vaccinations: Influenza vaccine due annually in September/October Pneumococcal vaccine : Up to date  Tdap vaccine : Up to date  Shingles vaccine : Up to date     Advanced directives: Yes   Conditions/risks identified: advanced age (>30mn, >>46women);diabetes mellitus;male gender  Next appointment: 1 year   Preventive Care 678Years and Older, Male Preventive care refers to lifestyle choices and visits with your health care provider that can promote health and wellness. What does preventive care include? A yearly physical exam. This is also called an annual well check. Dental exams once or twice a year. Routine eye exams. Ask your health care provider how often you should have your eyes checked. Personal lifestyle choices, including: Daily care of your teeth and gums. Regular physical activity. Eating a healthy diet. Avoiding tobacco and drug use. Limiting alcohol use. Practicing safe sex. Taking low doses of aspirin every day. Taking vitamin and mineral supplements as recommended by your health care provider. What happens during an annual well check? The services and screenings done by your health care provider during your annual well check will depend on your age, overall health, lifestyle risk factors, and family history of disease. Counseling  Your health care provider may ask you questions about your: Alcohol use. Tobacco use. Drug use. Emotional well-being. Home and relationship well-being. Sexual activity. Eating habits. History of  falls. Memory and ability to understand (cognition). Work and work eStatistician Screening  You may have the following tests or measurements: Height, weight, and BMI. Blood pressure. Lipid and cholesterol levels. These may be checked every 5 years, or more frequently if you are over 599years old. Skin check. Lung cancer screening. You may have this screening every year starting at age 4963if you have a 30-pack-year history of smoking and currently smoke or have quit within the past 15 years. Fecal occult blood test (FOBT) of the stool. You may have this test every year starting at age 76 Flexible sigmoidoscopy or colonoscopy. You may have a sigmoidoscopy every 5 years or a colonoscopy every 10 years starting at age 76 Prostate cancer screening. Recommendations will vary depending on your family history and other risks. Hepatitis C blood test. Hepatitis B blood test. Sexually transmitted disease (STD) testing. Diabetes screening. This is done by checking your blood sugar (glucose) after you have not eaten for a while (fasting). You may have this done every 1-3 years. Abdominal aortic aneurysm (AAA) screening. You may need this if you are a current or former smoker. Osteoporosis. You may be screened starting at age 10716if you are at high risk. Talk with your health care provider about your test results, treatment options, and if necessary, the need for more tests. Vaccines  Your health care provider may recommend certain vaccines, such as: Influenza vaccine. This is recommended every year. Tetanus, diphtheria, and acellular pertussis (Tdap, Td) vaccine. You may need a Td booster every 10 years. Zoster vaccine. You may need this after age 72952 Pneumococcal 13-valent conjugate (PCV13) vaccine. One dose is recommended after age 72917 Pneumococcal polysaccharide (PPSV23) vaccine. One dose is recommended after age  28. Talk to your health care provider about which screenings and vaccines you need and  how often you need them. This information is not intended to replace advice given to you by your health care provider. Make sure you discuss any questions you have with your health care provider. Document Released: 06/11/2015 Document Revised: 02/02/2016 Document Reviewed: 03/16/2015 Elsevier Interactive Patient Education  2017 [place] Prevention in the Home Falls can cause injuries. They can happen to people of all ages. There are many things you can do to make your home safe and to help prevent falls. What can I do on the outside of my home? Regularly fix the edges of walkways and driveways and fix any cracks. Remove anything that might make you trip as you walk through a door, such as a raised step or threshold. Trim any bushes or trees on the path to your home. Use bright outdoor lighting. Clear any walking paths of anything that might make someone trip, such as rocks or tools. Regularly check to see if handrails are loose or broken. Make sure that both sides of any steps have handrails. Any raised decks and porches should have guardrails on the edges. Have any leaves, snow, or ice cleared regularly. Use sand or salt on walking paths during winter. Clean up any spills in your garage right away. This includes oil or grease spills. What can I do in the bathroom? Use night lights. Install grab bars by the toilet and in the tub and shower. Do not use towel bars as grab bars. Use non-skid mats or decals in the tub or shower. If you need to sit down in the shower, use a plastic, non-slip stool. Keep the floor dry. Clean up any water that spills on the floor as soon as it happens. Remove soap buildup in the tub or shower regularly. Attach bath mats securely with double-sided non-slip rug tape. Do not have throw rugs and other things on the floor that can make you trip. What can I do in the bedroom? Use night lights. Make sure that you have a light by your bed that is easy to  reach. Do not use any sheets or blankets that are too big for your bed. They should not hang down onto the floor. Have a firm chair that has side arms. You can use this for support while you get dressed. Do not have throw rugs and other things on the floor that can make you trip. What can I do in the kitchen? Clean up any spills right away. Avoid walking on wet floors. Keep items that you use a lot in easy-to-reach places. If you need to reach something above you, use a strong step stool that has a grab bar. Keep electrical cords out of the way. Do not use floor polish or wax that makes floors slippery. If you must use wax, use non-skid floor wax. Do not have throw rugs and other things on the floor that can make you trip. What can I do with my stairs? Do not leave any items on the stairs. Make sure that there are handrails on both sides of the stairs and use them. Fix handrails that are broken or loose. Make sure that handrails are as long as the stairways. Check any carpeting to make sure that it is firmly attached to the stairs. Fix any carpet that is loose or worn. Avoid having throw rugs at the top or bottom of the stairs. If you do have  throw rugs, attach them to the floor with carpet tape. Make sure that you have a light switch at the top of the stairs and the bottom of the stairs. If you do not have them, ask someone to add them for you. What else can I do to help prevent falls? Wear shoes that: Do not have high heels. Have rubber bottoms. Are comfortable and fit you well. Are closed at the toe. Do not wear sandals. If you use a stepladder: Make sure that it is fully opened. Do not climb a closed stepladder. Make sure that both sides of the stepladder are locked into place. Ask someone to hold it for you, if possible. Clearly mark and make sure that you can see: Any grab bars or handrails. First and last steps. Where the edge of each step is. Use tools that help you move  around (mobility aids) if they are needed. These include: Canes. Walkers. Scooters. Crutches. Turn on the lights when you go into a dark area. Replace any light bulbs as soon as they burn out. Set up your furniture so you have a clear path. Avoid moving your furniture around. If any of your floors are uneven, fix them. If there are any pets around you, be aware of where they are. Review your medicines with your doctor. Some medicines can make you feel dizzy. This can increase your chance of falling. Ask your doctor what other things that you can do to help prevent falls. This information is not intended to replace advice given to you by your health care provider. Make sure you discuss any questions you have with your health care provider. Document Released: 03/11/2009 Document Revised: 10/21/2015 Document Reviewed: 06/19/2014 Elsevier Interactive Patient Education  2017 [employer].

## 2022-04-25 ENCOUNTER — Non-Acute Institutional Stay: Payer: Medicare Other | Admitting: Internal Medicine

## 2022-04-25 ENCOUNTER — Telehealth: Payer: Self-pay

## 2022-04-25 ENCOUNTER — Encounter: Payer: Self-pay | Admitting: Internal Medicine

## 2022-04-25 ENCOUNTER — Other Ambulatory Visit: Payer: Self-pay

## 2022-04-25 VITALS — BP 134/82 | HR 74 | Temp 97.9°F | Resp 16 | Ht 71.5 in | Wt 210.4 lb

## 2022-04-25 DIAGNOSIS — E1165 Type 2 diabetes mellitus with hyperglycemia: Secondary | ICD-10-CM

## 2022-04-25 DIAGNOSIS — N529 Male erectile dysfunction, unspecified: Secondary | ICD-10-CM | POA: Insufficient documentation

## 2022-04-25 DIAGNOSIS — E663 Overweight: Secondary | ICD-10-CM

## 2022-04-25 DIAGNOSIS — I10 Essential (primary) hypertension: Secondary | ICD-10-CM | POA: Insufficient documentation

## 2022-04-25 LAB — BASIC METABOLIC PANEL
BUN: 14 (ref 4–21)
CO2: 25 — AB (ref 13–22)
Chloride: 100 (ref 99–108)
Creatinine: 1 (ref 0.6–1.3)
Glucose: 174
Potassium: 5 mEq/L (ref 3.5–5.1)
Sodium: 138 (ref 137–147)

## 2022-04-25 LAB — COMPREHENSIVE METABOLIC PANEL
Calcium: 9.8 (ref 8.7–10.7)
eGFR: 76

## 2022-04-25 MED ORDER — SIMVASTATIN 40 MG PO TABS
ORAL_TABLET | ORAL | 1 refills | Status: DC
Start: 2022-04-25 — End: 2022-10-24

## 2022-04-25 MED ORDER — VITAMIN D (ERGOCALCIFEROL) 1.25 MG (50000 UNIT) PO CAPS
ORAL_CAPSULE | ORAL | 1 refills | Status: DC
Start: 2022-04-25 — End: 2022-10-18

## 2022-04-25 NOTE — Telephone Encounter (Signed)
Pharmacy send a rx for simvastatin 40 mg and medication was send into pharmacy for refill.

## 2022-04-27 DIAGNOSIS — Z6829 Body mass index (BMI) 29.0-29.9, adult: Secondary | ICD-10-CM | POA: Diagnosis not present

## 2022-04-27 DIAGNOSIS — M5416 Radiculopathy, lumbar region: Secondary | ICD-10-CM | POA: Diagnosis not present

## 2022-04-27 DIAGNOSIS — M4310 Spondylolisthesis, site unspecified: Secondary | ICD-10-CM | POA: Diagnosis not present

## 2022-04-27 NOTE — Progress Notes (Signed)
Location: [facility] of Service:  Clinic (12)  Provider:   Code Status:  Goals of Care:     04/25/2022    1:43 PM  Advanced Directives  Does Patient Have a Medical Advance Directive? Yes  Type of Advance Directive [place]  Does patient want to make changes to medical advance directive? No - Patient declined     Chief Complaint  Patient presents with   Medical Management of Chronic Issues    6 week follow uo    HPI: Patient is a 76 y.o. male seen today for an acute visit for Follow up of his Diabetes  Diabetes Mellitus type 2  Came for follow up for his sugars Could not do Januvia due to cost Tried Amaryl. CBGS would go down in the evening to 70 and he will feel  Shaking So he stopped it  [place] started Mon[place] which he is taking but costing him $400 /Month and he thinks it is making him bloated and weight gain Fasting BS around 150 but they do go up in the evening' He [facility] who said they would not Cover the cost of new Diabetic drugs Had A1C check forst and then want to decide if he wants to continue new drug  Low back pain Gets steroid injection and has taken PO prednisone  His [facility] physician prescribed Oral Diclofenac    Other issues Sarcoidosis Diagnosed with Lymph node Biopsy Not on Active treatment Follows with Pulmonology Does have Prednisone Prescription PRN if needed Recent PET scan was negative     H/o Sleep Apnea on CPAP    Liver Lesion MR Abdomen was negative     Has not lost weight Widowed Wife died due to Colon Cancer 7 years ago Has sons and grandsons  Past Medical History:  Diagnosis Date   Asthma    Calcific Achilles tendonitis    bilateral   Controlled type 2 diabetes mellitus without complication (HCC)    DDD (degenerative disc disease), cervical    Gallstone 09/11/2011   GERD (gastroesophageal reflux disease)    Hyperlipidemia    mixed   Insomnia    Mild  cognitive impairment    Overweight (BMI 25.0-29.9)    Prostate cancer ([place])    Pulmonary nodule/lesion, solitary 06/2004   f/u in 2014, 2015 stable in [facility]   Sleep apnea    Slow transit constipation    Vitamin D deficiency     Past Surgical History:  Procedure Laterality Date   CERVICAL FUSION  1999 & 2004   implant metal plate   CHOLECYSTECTOMY  2012   COLONOSCOPY  12/2015   EYE SURGERY  1997   laser vision correction   palatouvulopasty  1995   PROSTATE SURGERY  09/11/2011   prostatectomy for prostate cancer    TONSILLECTOMY     WISDOM TOOTH EXTRACTION      Allergies  Allergen Reactions   Tetracyclines & Related Other (See Comments)    hepatitis   Niacin And Related Other (See Comments)    headache   Wellbutrin [Bupropion] Other (See Comments)    dizziness    Outpatient Encounter Medications as of 04/25/2022  Medication Sig   b complex vitamins capsule Take 1 capsule by mouth daily.   Coenzyme Q10 (CO Q 10) 100 MG CAPS Take 2 capsules by mouth daily.   fexofenadine (ALLEGRA) 60 MG tablet Take 60 mg by mouth daily.   fluocinonide (LIDEX)  0.05 % external solution Apply 1 application. topically 2 (two) times daily as needed.   linagliptin (TRADJENTA) 5 MG TABS tablet Take 1 tablet (5 mg total) by mouth daily.   metFORMIN (GLUCOPHAGE-XR) 500 MG 24 hr tablet Take 2 tablets (1,000 mg total) by mouth 2 (two) times daily with a meal.   polyethylene glycol (MIRALAX / GLYCOLAX) packet Take 17 g by mouth daily.   sildenafil (VIAGRA) 100 MG tablet Take 1 tablet (100 mg total) by mouth daily as needed for erectile dysfunction.   simvastatin (ZOCOR) 40 MG tablet Take one tablet by mouth once daily in the evening.   Turmeric 500 MG CAPS Take 500 mg by mouth. Twice a week   Vitamin D, Ergocalciferol, (DRISDOL) 1.25 MG (50000 UNIT) CAPS capsule TAKE 1 CAPSULE ONCE A WEEK.   [DISCONTINUED] simvastatin (ZOCOR) 40 MG tablet Take one tablet by mouth once daily in the evening.    [DISCONTINUED] Vitamin D, Ergocalciferol, (DRISDOL) 1.25 MG (50000 UNIT) CAPS capsule TAKE 1 CAPSULE ONCE A WEEK.   No facility-administered encounter medications on file as of 04/25/2022.    Review of Systems:  Review of Systems  Constitutional:  Negative for activity change, appetite change and unexpected weight change.  HENT: Negative.    Respiratory:  Negative for cough and shortness of breath.   Cardiovascular:  Negative for leg swelling.  Gastrointestinal:  Negative for constipation.  Genitourinary:  Negative for frequency.  Musculoskeletal:  Negative for arthralgias, gait problem and myalgias.  Skin: Negative.  Negative for rash.  Neurological:  Negative for dizziness and weakness.  Psychiatric/Behavioral:  Negative for confusion and sleep disturbance.   All other systems reviewed and are negative.   Health Maintenance  Topic Date Due   COVID-19 Vaccine (4 - 2023-24 season) 01/27/2022   Diabetic kidney evaluation - Urine ACR  02/10/2022   HEMOGLOBIN A1C  07/13/2022   FOOT EXAM  11/15/2022   OPHTHALMOLOGY EXAM  11/15/2022   Medicare Annual Wellness (AWV)  04/13/2023   Diabetic kidney evaluation - GFR measurement  04/26/2023   DTaP/Tdap/Td (3 - Td or Tdap) 05/07/2027   Pneumonia Vaccine 41+ Years old  Completed   INFLUENZA VACCINE  Completed   Hepatitis C Screening  Completed   Zoster Vaccines- Shingrix  Completed   HPV VACCINES  Aged Out   COLONOSCOPY (Pts 45-59yr Insurance coverage will need to be confirmed)  Discontinued    Physical Exam: Vitals:   04/25/22 1343  BP: 134/82  Pulse: 74  Resp: 16  Temp: 97.9 F (36.6 C)  TempSrc: Temporal  SpO2: 96%  Weight: 210 lb 6.4 oz (95.4 kg)  Height: 5' 11.5" (1.816 m)   Body mass index is 28.94 kg/m. Physical Exam Vitals reviewed.  Constitutional:      Appearance: Normal appearance.  HENT:     Head: Normocephalic.     Nose: Nose normal.     Mouth/Throat:     Mouth: Mucous membranes are moist.     Pharynx:  Oropharynx is clear.  Eyes:     Pupils: Pupils are equal, round, and reactive to light.  Cardiovascular:     Rate and Rhythm: Normal rate and regular rhythm.     Pulses: Normal pulses.     Heart sounds: No murmur heard. Pulmonary:     Effort: Pulmonary effort is normal. No respiratory distress.     Breath sounds: Normal breath sounds. No rales.  Abdominal:     General: Abdomen is flat. Bowel sounds are normal.  Palpations: Abdomen is soft.  Musculoskeletal:        General: No swelling.     Cervical back: Neck supple.  Skin:    General: Skin is warm.  Neurological:     General: No focal deficit present.     Mental Status: He is alert and oriented to person, place, and time.  Psychiatric:        Mood and Affect: Mood normal.        Thought Content: Thought content normal.    Labs reviewed: Basic Metabolic Panel: Recent Labs    08/09/21 0000 01/10/22 0000 04/25/22 0600  NA 136* 139 138  K 4.8 5.2* 5.0  CL 98* 102 100  CO2 24* 25* 25*  BUN '17 13 14  '$ CREATININE 0.9 1.0 1.0  CALCIUM 9.7 9.9 9.8  TSH 4.03  --   --    Liver Function Tests: Recent Labs    08/09/21 0000  AST 15  ALT 24  ALKPHOS 52  ALBUMIN 4.4   No results for input(s): "LIPASE", "AMYLASE" in the last 8760 hours. No results for input(s): "AMMONIA" in the last 8760 hours. CBC: Recent Labs    08/09/21 0000  WBC 4.6  HGB 13.4*  HCT 40*  PLT 175   Lipid Panel: Recent Labs    08/09/21 0000  CHOL 101  HDL 45  LDLCALC 35  TRIG 105   Lab Results  Component Value Date   HGBA1C 8.0 01/10/2022    Procedures since last visit: No results found.  Assessment/Plan 1. Uncontrolled diabetes mellitus with hyperglycemia, without long-term current use of insulin (HCC) Repeat A1C He thinks that now he is off Prednisone it should come down Doesn't want to continue Tradjenta/Januvia Did not tolerate Amaryl  2. Overweight (BMI 25.0-29.9)  Other issues  Sarcoidosis Follows with  Pulmonary Stable     Mixed hyperlipidemia On Zocor  LDL 35    Sleep apnea, unspecified type CPAP    Vasculogenic erectile dysfunction, unspecified vasculogenic erectile dysfunction type Viagra PRn     Low back pain Follow with Ortho  Vit D Def On High doses of Vit D Last level was good in 03/23  Labs/tests ordered:  * No order type specified * Next appt:  04/26/2022

## 2022-05-04 DIAGNOSIS — R739 Hyperglycemia, unspecified: Secondary | ICD-10-CM | POA: Diagnosis not present

## 2022-05-04 LAB — HEMOGLOBIN A1C: Hemoglobin A1C: 7.2

## 2022-05-09 ENCOUNTER — Telehealth: Payer: Self-pay | Admitting: Internal Medicine

## 2022-05-09 DIAGNOSIS — E1165 Type 2 diabetes mellitus with hyperglycemia: Secondary | ICD-10-CM

## 2022-05-09 NOTE — Telephone Encounter (Signed)
Discussed improvement of his A1C most likely due to Amaryl and Tradjenta Amaryl was discontinued due to hypoglycemia He agreed to continue Tradjenta for now

## 2022-06-06 ENCOUNTER — Non-Acute Institutional Stay: Payer: Medicare Other | Admitting: Internal Medicine

## 2022-06-06 ENCOUNTER — Encounter: Payer: Self-pay | Admitting: Internal Medicine

## 2022-06-06 VITALS — BP 140/86 | HR 76 | Temp 97.6°F | Resp 17 | Ht 71.5 in | Wt 208.0 lb

## 2022-06-06 DIAGNOSIS — G4730 Sleep apnea, unspecified: Secondary | ICD-10-CM

## 2022-06-06 DIAGNOSIS — E782 Mixed hyperlipidemia: Secondary | ICD-10-CM | POA: Diagnosis not present

## 2022-06-06 DIAGNOSIS — E1165 Type 2 diabetes mellitus with hyperglycemia: Secondary | ICD-10-CM

## 2022-06-06 DIAGNOSIS — E663 Overweight: Secondary | ICD-10-CM

## 2022-06-06 DIAGNOSIS — H903 Sensorineural hearing loss, bilateral: Secondary | ICD-10-CM | POA: Insufficient documentation

## 2022-06-06 MED ORDER — LINAGLIPTIN 5 MG PO TABS
5.0000 mg | ORAL_TABLET | Freq: Every day | ORAL | 1 refills | Status: DC
Start: 2022-06-06 — End: 2022-09-18

## 2022-06-11 NOTE — Progress Notes (Signed)
Location: [facility] of Service:  Clinic (12)  Provider:   Code Status:  Goals of Care:     06/06/2022    1:23 PM  Advanced Directives  Does Patient Have a Medical Advance Directive? Yes  Type of Paramedic of [place];Living will;Out of facility DNR (pink MOST or yellow form)  Copy of [place] in Chart? Yes - validated most recent copy scanned in chart (See row information)     Chief Complaint  Patient presents with   Medical Management of Chronic Issues    Follow up    HPI: Patient is a 77 y.o. male seen today for an acute visit for Follow up on his CBGS and A1C  Diabetes Mellitus type 2  Came for follow up for his sugars Could not do Januvia due to cost Tried Amaryl. CBGS would go down in the evening to 70 and he will feel  Shaking So he stopped it   [place] started Mon[place] which he is taking but costing him $400 /Month  CBGS still sometimes go over 200 Trying to cut back on his Sugars and Deserts Also not going to take back shots anymore for his Back pain Eventually want to come off tradgenta due to cost  Low back pain Gets steroid injection and has taken PO prednisone  [facility] physician prescribed Oral Diclofenac     Other issues Sarcoidosis Diagnosed with Lymph node Biopsy Not on Active treatment Follows with Pulmonology Does have Prednisone Prescription PRN if needed Recent PET scan was negative     H/o Sleep Apnea on CPAP    Liver Lesion MR Abdomen was negative Wife died due to Colon Cancer 7 years ago Has sons and grandsons   Past Medical History:  Diagnosis Date   Asthma    Calcific Achilles tendonitis    bilateral   Controlled type 2 diabetes mellitus without complication (HCC)    DDD (degenerative disc disease), cervical    Gallstone 09/11/2011   GERD (gastroesophageal reflux disease)    Hyperlipidemia    mixed   Insomnia    Mild cognitive impairment     Overweight (BMI 25.0-29.9)    Prostate cancer ([place])    Pulmonary nodule/lesion, solitary 06/2004   f/u in 2014, 2015 stable in [facility]   Sleep apnea    Slow transit constipation    Vitamin D deficiency     Past Surgical History:  Procedure Laterality Date   CERVICAL FUSION  1999 & 2004   implant metal plate   CHOLECYSTECTOMY  2012   COLONOSCOPY  12/2015   EYE SURGERY  1997   laser vision correction   palatouvulopasty  1995   PROSTATE SURGERY  09/11/2011   prostatectomy for prostate cancer    TONSILLECTOMY     WISDOM TOOTH EXTRACTION      Allergies  Allergen Reactions   Tetracyclines & Related Other (See Comments)    hepatitis   Niacin And Related Other (See Comments)    headache   Wellbutrin [Bupropion] Other (See Comments)    dizziness    Outpatient Encounter Medications as of 06/06/2022  Medication Sig   b complex vitamins capsule Take 1 capsule by mouth daily.   Coenzyme Q10 (CO Q 10) 100 MG CAPS Take 2 capsules by mouth daily.   fexofenadine (ALLEGRA) 60 MG tablet Take 60 mg by mouth daily.   metFORMIN (GLUCOPHAGE-XR) 500 MG 24 hr tablet Take 2 tablets (1,000 mg total)  by mouth 2 (two) times daily with a meal.   polyethylene glycol (MIRALAX / GLYCOLAX) packet Take 17 g by mouth daily.   simvastatin (ZOCOR) 40 MG tablet Take one tablet by mouth once daily in the evening.   Turmeric 500 MG CAPS Take 500 mg by mouth. Twice a week   Vitamin D, Ergocalciferol, (DRISDOL) 1.25 MG (50000 UNIT) CAPS capsule TAKE 1 CAPSULE ONCE A WEEK.   [DISCONTINUED] linagliptin (TRADJENTA) 5 MG TABS tablet Take 1 tablet (5 mg total) by mouth daily.   [DISCONTINUED] sildenafil (VIAGRA) 100 MG tablet Take 1 tablet (100 mg total) by mouth daily as needed for erectile dysfunction.   fluocinonide (LIDEX) 0.05 % external solution Apply 1 application. topically 2 (two) times daily as needed.   linagliptin (TRADJENTA) 5 MG TABS tablet Take 1 tablet (5 mg total) by mouth daily.   No  facility-administered encounter medications on file as of 06/06/2022.    Review of Systems:  Review of Systems  Constitutional:  Negative for activity change, appetite change and unexpected weight change.  HENT: Negative.    Respiratory:  Negative for cough and shortness of breath.   Cardiovascular:  Negative for leg swelling.  Gastrointestinal:  Negative for constipation.  Genitourinary:  Negative for frequency.  Musculoskeletal:  Positive for back pain. Negative for arthralgias, gait problem and myalgias.  Skin: Negative.  Negative for rash.  Neurological:  Negative for dizziness and weakness.  Psychiatric/Behavioral:  Negative for confusion and sleep disturbance.   All other systems reviewed and are negative.   Health Maintenance  Topic Date Due   Diabetic kidney evaluation - Urine ACR  02/10/2022   HEMOGLOBIN A1C  11/03/2022   FOOT EXAM  11/15/2022   OPHTHALMOLOGY EXAM  11/15/2022   Medicare Annual Wellness (AWV)  04/13/2023   Diabetic kidney evaluation - eGFR measurement  04/26/2023   DTaP/Tdap/Td (3 - Td or Tdap) 05/07/2027   Pneumonia Vaccine 29+ Years old  Completed   INFLUENZA VACCINE  Completed   Hepatitis C Screening  Completed   Zoster Vaccines- Shingrix  Completed   HPV VACCINES  Aged Out   COLONOSCOPY (Pts 45-43yr Insurance coverage will need to be confirmed)  Discontinued   COVID-19 Vaccine  Discontinued    Physical Exam: Vitals:   06/06/22 1326  BP: (!) 140/86  Pulse: 76  Resp: 17  Temp: 97.6 F (36.4 C)  TempSrc: Temporal  SpO2: 96%  Weight: 208 lb (94.3 kg)  Height: 5' 11.5" (1.816 m)   Body mass index is 28.61 kg/m. Physical Exam Vitals reviewed.  Constitutional:      Appearance: Normal appearance.  HENT:     Head: Normocephalic.     Nose: Nose normal.     Mouth/Throat:     Mouth: Mucous membranes are moist.     Pharynx: Oropharynx is clear.  Eyes:     Pupils: Pupils are equal, round, and reactive to light.  Cardiovascular:     Rate  and Rhythm: Normal rate.     Pulses: Normal pulses.  Pulmonary:     Effort: Pulmonary effort is normal.     Breath sounds: Normal breath sounds.  Abdominal:     General: Abdomen is flat. Bowel sounds are normal.     Palpations: Abdomen is soft.  Musculoskeletal:        General: No swelling.     Cervical back: Neck supple.  Neurological:     Mental Status: He is alert.     Labs reviewed: Basic  Metabolic Panel: Recent Labs    08/09/21 0000 01/10/22 0000 04/25/22 0600  NA 136* 139 138  K 4.8 5.2* 5.0  CL 98* 102 100  CO2 24* 25* 25*  BUN '17 13 14  '$ CREATININE 0.9 1.0 1.0  CALCIUM 9.7 9.9 9.8  TSH 4.03  --   --    Liver Function Tests: Recent Labs    08/09/21 0000  AST 15  ALT 24  ALKPHOS 52  ALBUMIN 4.4   No results for input(s): "LIPASE", "AMYLASE" in the last 8760 hours. No results for input(s): "AMMONIA" in the last 8760 hours. CBC: Recent Labs    08/09/21 0000  WBC 4.6  HGB 13.4*  HCT 40*  PLT 175   Lipid Panel: Recent Labs    08/09/21 0000  CHOL 101  HDL 45  LDLCALC 35  TRIG 105   Lab Results  Component Value Date   HGBA1C 7.2 05/04/2022    Procedures since last visit: No results found.  Assessment/Plan 1. Uncontrolled diabetes mellitus with hyperglycemia, without long-term current use of insulin (HCC) A1C 7.2 Continue both Metformin and Tradjenta  2. Overweight (BMI 25.0-29.9) Trying to cut back his calorie intake  3. Mixed hyperlipidemia Statin  4. Sleep apnea, unspecified type     Labs/tests ordered:  * No order type specified * Next appt:  09/18/2022

## 2022-06-22 ENCOUNTER — Other Ambulatory Visit: Payer: Self-pay | Admitting: Adult Health

## 2022-06-22 MED ORDER — VALACYCLOVIR HCL 1 G PO TABS: 2000.0000 mg | ORAL_TABLET | Freq: Two times a day (BID) | ORAL | 3 refills | Status: AC

## 2022-06-22 NOTE — Progress Notes (Signed)
Pt left message at [facility] requesting prescription to have on hand for cold sores. 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

## 2022-07-24 ENCOUNTER — Other Ambulatory Visit: Payer: Self-pay | Admitting: Internal Medicine

## 2022-07-24 DIAGNOSIS — E119 Type 2 diabetes mellitus without complications: Secondary | ICD-10-CM

## 2022-08-21 LAB — HM DIABETES EYE EXAM

## 2022-09-01 ENCOUNTER — Telehealth: Payer: Self-pay

## 2022-09-01 NOTE — Telephone Encounter (Signed)
Patient called about appointment April 22nd. He states that he is not scheduled for labs the week before and there are no orders in as well. Does patient need to have labs done the week before appointment  Message sent to Fletcher Anon, NP

## 2022-09-04 NOTE — Telephone Encounter (Signed)
These labs have been ordered and the clinic nurse will contact you with an apt for lab draw prior to your apt   A1C, TSH, CMP, CBC, and Lipid panel  Thanks Peggye Ley NP

## 2022-09-12 DIAGNOSIS — E785 Hyperlipidemia, unspecified: Secondary | ICD-10-CM | POA: Diagnosis not present

## 2022-09-12 DIAGNOSIS — E1165 Type 2 diabetes mellitus with hyperglycemia: Secondary | ICD-10-CM | POA: Diagnosis not present

## 2022-09-12 DIAGNOSIS — E1100 Type 2 diabetes mellitus with hyperosmolarity without nonketotic hyperglycemic-hyperosmolar coma (NKHHC): Secondary | ICD-10-CM | POA: Diagnosis not present

## 2022-09-12 LAB — COMPREHENSIVE METABOLIC PANEL
Albumin: 4.4 (ref 3.5–5.0)
Calcium: 9.5 (ref 8.7–10.7)
Globulin: 2.4

## 2022-09-12 LAB — BASIC METABOLIC PANEL
BUN: 13 (ref 4–21)
CO2: 24 — AB (ref 13–22)
Chloride: 100 (ref 99–108)
Creatinine: 1.1 (ref 0.6–1.3)
Glucose: 188
Potassium: 4.2 mEq/L (ref 3.5–5.1)
Sodium: 136 — AB (ref 137–147)

## 2022-09-12 LAB — HEPATIC FUNCTION PANEL
ALT: 38 U/L (ref 10–40)
AST: 25 (ref 14–40)
Alkaline Phosphatase: 56 (ref 25–125)
Bilirubin, Total: 0.6

## 2022-09-12 LAB — CBC AND DIFFERENTIAL
HCT: 43 (ref 41–53)
Hemoglobin: 14.2 (ref 13.5–17.5)
Platelets: 199 10*3/uL (ref 150–400)
WBC: 5

## 2022-09-12 LAB — HEMOGLOBIN A1C: Hemoglobin A1C: 8.4

## 2022-09-12 LAB — LIPID PANEL
Cholesterol: 171 (ref 0–200)
HDL: 37 (ref 35–70)
LDL Cholesterol: 103
Triglycerides: 156 (ref 40–160)

## 2022-09-12 LAB — CBC: RBC: 4.91 (ref 3.87–5.11)

## 2022-09-12 LAB — TSH: TSH: 3.49 (ref 0.41–5.90)

## 2022-09-18 ENCOUNTER — Non-Acute Institutional Stay: Payer: Medicare Other | Admitting: Adult Health

## 2022-09-18 ENCOUNTER — Encounter: Payer: Self-pay | Admitting: Adult Health

## 2022-09-18 VITALS — BP 144/82 | HR 87 | Temp 97.7°F | Resp 17 | Ht 71.5 in | Wt 210.4 lb

## 2022-09-18 DIAGNOSIS — E1165 Type 2 diabetes mellitus with hyperglycemia: Secondary | ICD-10-CM | POA: Diagnosis not present

## 2022-09-18 DIAGNOSIS — D869 Sarcoidosis, unspecified: Secondary | ICD-10-CM | POA: Diagnosis not present

## 2022-09-18 DIAGNOSIS — E782 Mixed hyperlipidemia: Secondary | ICD-10-CM | POA: Diagnosis not present

## 2022-09-18 DIAGNOSIS — I10 Essential (primary) hypertension: Secondary | ICD-10-CM | POA: Diagnosis not present

## 2022-09-18 DIAGNOSIS — M5136 Other intervertebral disc degeneration, lumbar region: Secondary | ICD-10-CM | POA: Diagnosis not present

## 2022-09-18 MED ORDER — METFORMIN HCL 1000 MG PO TABS: 1500.0000 mg | ORAL_TABLET | Freq: Two times a day (BID) | ORAL | 2 refills | Status: DC

## 2022-09-18 NOTE — Patient Instructions (Addendum)
  Keep a log of blood sugars twice  a day and report to me

## 2022-09-18 NOTE — Progress Notes (Signed)
Location:  [facility]  POS: Clinic  Provider: Fletcher Anon, ANP  Code Status:  Goals of Care:     09/18/2022    1:59 PM  Advanced Directives  Does Patient Have a Medical Advance Directive? Yes  Type of Estate agent of [place];Living will;Out of facility DNR (pink MOST or yellow form)  Does patient want to make changes to medical advance directive? No - Patient declined     Chief Complaint  Patient presents with   Medical Management of Chronic Issues    3 month follow up wants to discuss next steps with a1c   Health Maintenance    Discuss the need for Diabetic kidney Evaluation     HPI: Patient is a 77 y.o. male seen today for follow up   DM II: Lab Results  Component Value Date   HGBA1C 8.4 09/12/2022   A1C going up Worse after taking Tradjenta No foot ulcers, foot pain or numbness.  Did not tolerate amaryl. Insurance did not cover [place].   CBG home readings. Before meals 160-195 After meals 195-234  Hx of DDD of cervical and lumbar spine.  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  OSA on CPAP followed by pulmonary   HDL ON zocor Lab Results  Component Value Date   LDLCALC 103 09/12/2022    Past Medical History:  Diagnosis Date   Asthma    Calcific Achilles tendonitis    bilateral   Controlled type 2 diabetes mellitus without complication    DDD (degenerative disc disease), cervical    Gallstone 09/11/2011   GERD (gastroesophageal reflux disease)    Hyperlipidemia    mixed   Insomnia    Mild cognitive impairment    Overweight (BMI 25.0-29.9)    Prostate cancer    Pulmonary nodule/lesion, solitary 06/2004   f/u in 2014, 2015 stable in [facility]   Sleep apnea    Slow transit constipation    Vitamin D deficiency     Past Surgical History:  Procedure Laterality Date   CERVICAL FUSION   1999 & 2004   implant metal plate   CHOLECYSTECTOMY  2012   COLONOSCOPY  12/2015   EYE SURGERY  1997   laser vision correction   palatouvulopasty  1995   PROSTATE SURGERY  09/11/2011   prostatectomy for prostate cancer    TONSILLECTOMY     WISDOM TOOTH EXTRACTION      Allergies  Allergen Reactions   Tetracyclines & Related Other (See Comments)    hepatitis   Niacin And Related Other (See Comments)    headache   Wellbutrin [Bupropion] Other (See Comments)    dizziness    Outpatient Encounter Medications as of 09/18/2022  Medication Sig   b complex vitamins capsule Take 1 capsule by mouth daily.   Coenzyme Q10 (CO Q 10) 100 MG CAPS Take 2 capsules by mouth daily.   fexofenadine (ALLEGRA) 60 MG tablet Take 60 mg by mouth daily.   fluocinonide (LIDEX) 0.05 % external solution Apply 1 application. topically 2 (two) times daily as needed.   linagliptin (TRADJENTA) 5 MG TABS tablet Take 1 tablet (5 mg total) by mouth daily.   metFORMIN (GLUCOPHAGE-XR) 500 MG 24 hr tablet TAKE TWO TABLETS BY MOUTH TWICE A DAY WITH A MEAL   polyethylene glycol (MIRALAX / GLYCOLAX) packet Take 17 g by mouth daily.  simvastatin (ZOCOR) 40 MG tablet Take one tablet by mouth once daily in the evening.   Turmeric 500 MG CAPS Take 500 mg by mouth. Twice a week   Vitamin D, Ergocalciferol, (DRISDOL) 1.25 MG (50000 UNIT) CAPS capsule TAKE 1 CAPSULE ONCE A WEEK.   No facility-administered encounter medications on file as of 09/18/2022.    Review of Systems:  Review of Systems  Constitutional:  Negative for activity change, appetite change, chills and diaphoresis.       Feels shaky  when bp low  HENT:  Negative for congestion.   Eyes:  Negative for photophobia, pain, discharge, redness, itching and visual disturbance.  Respiratory:  Negative for cough and shortness of breath.   Cardiovascular:  Negative for chest pain and leg swelling.  Gastrointestinal:  Negative for abdominal distention, constipation and  diarrhea.  Genitourinary:  Negative for difficulty urinating and dysuria.  Musculoskeletal:  Negative for back pain.  Skin:  Negative for wound.  Neurological:  Negative for tremors and weakness.  Psychiatric/Behavioral:  Negative for confusion.     Health Maintenance  Topic Date Due   Diabetic kidney evaluation - Urine ACR  02/10/2022   FOOT EXAM  11/15/2022   INFLUENZA VACCINE  12/28/2022   HEMOGLOBIN A1C  03/14/2023   Medicare Annual Wellness (AWV)  04/13/2023   OPHTHALMOLOGY EXAM  08/21/2023   Diabetic kidney evaluation - eGFR measurement  09/12/2023   DTaP/Tdap/Td (3 - Td or Tdap) 05/07/2027   Pneumonia Vaccine 52+ Years old  Completed   Hepatitis C Screening  Completed   Zoster Vaccines- Shingrix  Completed   HPV VACCINES  Aged Out   COLONOSCOPY (Pts 45-75yrs Insurance coverage will need to be confirmed)  Discontinued   COVID-19 Vaccine  Discontinued    Physical Exam: Vitals:   09/18/22 1353 09/18/22 1357  BP: (!) 148/82 (!) 144/82  Pulse: 87   Resp: 17   Temp: 97.7 F (36.5 C)   TempSrc: Temporal   SpO2: 97%   Weight: 210 lb 6.4 oz (95.4 kg)   Height: 5' 11.5" (1.816 m)    Body mass index is 28.94 kg/m.  Wt Readings from Last 3 Encounters:  09/18/22 210 lb 6.4 oz (95.4 kg)  06/06/22 208 lb (94.3 kg)  04/25/22 210 lb 6.4 oz (95.4 kg)    Physical Exam Vitals and nursing note reviewed.  Cardiovascular:     Rate and Rhythm: Normal rate and regular rhythm.  Pulmonary:     Effort: Pulmonary effort is normal.     Breath sounds: Normal breath sounds.  Musculoskeletal:     Right lower leg: No edema.     Left lower leg: No edema.  Neurological:     General: No focal deficit present.     Mental Status: Mental status is at baseline.  Psychiatric:        Mood and Affect: Mood normal.     Labs reviewed: Basic Metabolic Panel: Recent Labs    01/10/22 0000 04/25/22 0600 09/12/22 0000  NA 139 138 136*  K 5.2* 5.0 4.2  CL 102 100 100  CO2 25* 25* 24*   BUN CREATININE 1.0 1.0 1.1  CALCIUM 9.9 9.8 9.5  TSH  --   --  3.49   Liver Function Tests: Recent Labs    09/12/22 0000  AST 25  ALT 38  ALKPHOS 56  ALBUMIN 4.4   No results for input(s): "LIPASE", "AMYLASE" in the last 8760 hours. No results for  input(s): "AMMONIA" in the last 8760 hours. CBC: Recent Labs    09/12/22 0000  WBC 5.0  HGB 14.2  HCT 43  PLT 199   Lipid Panel: Recent Labs    09/12/22 0000  CHOL 171  HDL 37  LDLCALC 103  TRIG 161   Lab Results  Component Value Date   HGBA1C 8.4 09/12/2022    Procedures since last visit: No results found.  Assessment/Plan  1. Uncontrolled type 2 diabetes mellitus with other specified complication, without long-term current use of insulin Uncontrolled Worse on tradjenta d/c Consider lantus but he wants to wait until after his long term trip to start Will increase metformin to 1500 mg bid until this time and have him f/u with a CBG log twice daily  Another consideration would be farxiga if covered by insurance.    2. Mixed hyperlipidemia Lab Results  Component Value Date   LDLCALC 103 09/12/2022   Continue zocor   3. Sarcoidosis No new issues Followed by pulmonary   5. DDD (degenerative disc disease), lumbar Cervical and lumbar Doing well No longer going for injections Using aleve prn   Labs/tests ordered:  * No order type specified *microalbumin  Next apt 11/20/22    Total time :  time greater than 50% of total time spent doing pt counseling and coordination of care

## 2022-09-19 DIAGNOSIS — E1129 Type 2 diabetes mellitus with other diabetic kidney complication: Secondary | ICD-10-CM | POA: Diagnosis not present

## 2022-09-20 LAB — MICROALBUMIN / CREATININE URINE RATIO

## 2022-09-20 LAB — MICROALBUMIN, URINE: Microalb, Ur: <1.2

## 2022-09-21 ENCOUNTER — Telehealth: Payer: Self-pay | Admitting: Adult Health

## 2022-09-21 DIAGNOSIS — I10 Essential (primary) hypertension: Secondary | ICD-10-CM

## 2022-09-21 DIAGNOSIS — E1165 Type 2 diabetes mellitus with hyperglycemia: Secondary | ICD-10-CM

## 2022-09-21 MED ORDER — LOSARTAN POTASSIUM 25 MG PO TABS: 25.0000 mg | ORAL_TABLET | Freq: Every day | ORAL | 1 refills | Status: DC

## 2022-09-21 MED ORDER — OZEMPIC (0.25 OR 0.5 MG/DOSE) 2 MG/3ML ~~LOC~~ SOPN: 0.2500 mg | PEN_INJECTOR | SUBCUTANEOUS | 1 refills | Status: DC

## 2022-09-21 NOTE — Telephone Encounter (Signed)
Called patient to discuss urine microalbumin which did not indicate any protein in the urine.  Discussed that his BP is above goal given his hx of DM. Will start losartan and check BMP in two weeks.  He will f/u with the clinic nurse for bp check and has a f/u apt.  Also given his elevated A1C will order Ozempic and see if this will be covered. Could consider actos or farxiga if alternative GLP 1 not covered.  Resident requested endocrinology referral

## 2022-10-05 DIAGNOSIS — I110 Hypertensive heart disease with heart failure: Secondary | ICD-10-CM | POA: Diagnosis not present

## 2022-10-05 LAB — BASIC METABOLIC PANEL
BUN: 16 (ref 4–21)
CO2: 24 — AB (ref 13–22)
Chloride: 96 — AB (ref 99–108)
Creatinine: 1.1 (ref 0.6–1.3)
Glucose: 152
Potassium: 4.8 mEq/L (ref 3.5–5.1)
Sodium: 132 — AB (ref 137–147)

## 2022-10-05 LAB — COMPREHENSIVE METABOLIC PANEL: Calcium: 10 (ref 8.7–10.7)

## 2022-10-06 ENCOUNTER — Telehealth: Payer: Self-pay | Admitting: Adult Health

## 2022-10-06 MED ORDER — LOSARTAN POTASSIUM 50 MG PO TABS: 50.0000 mg | ORAL_TABLET | Freq: Every day | ORAL | 1 refills | Status: DC

## 2022-10-06 NOTE — Telephone Encounter (Signed)
Mr Kellum has been taking losartan 25 mg daily. BP recheck with clinic nurse 146/72. Goal <140 systolic due to DM and age.  Will increase losartan to 50 mg. [facility] clinic nurse will communicate with him. He will f/u with me next month. BMP reviewed after starting new med and is ok.

## 2022-10-09 DIAGNOSIS — H26491 Other secondary cataract, right eye: Secondary | ICD-10-CM | POA: Diagnosis not present

## 2022-10-09 DIAGNOSIS — H18413 Arcus senilis, bilateral: Secondary | ICD-10-CM | POA: Diagnosis not present

## 2022-10-09 DIAGNOSIS — Z961 Presence of intraocular lens: Secondary | ICD-10-CM | POA: Diagnosis not present

## 2022-10-09 DIAGNOSIS — E119 Type 2 diabetes mellitus without complications: Secondary | ICD-10-CM | POA: Diagnosis not present

## 2022-10-13 DIAGNOSIS — H26492 Other secondary cataract, left eye: Secondary | ICD-10-CM | POA: Diagnosis not present

## 2022-10-18 ENCOUNTER — Other Ambulatory Visit: Payer: Self-pay | Admitting: Internal Medicine

## 2022-10-21 ENCOUNTER — Other Ambulatory Visit: Payer: Self-pay | Admitting: Internal Medicine

## 2022-11-19 NOTE — Progress Notes (Unsigned)
Location:  [facility]  POS: Clinic  Provider: Fletcher Anon, ANP  Code Status:  Goals of Care:     11/20/2022    3:45 PM  Advanced Directives  Does Patient Have a Medical Advance Directive? Yes  Does patient want to make changes to medical advance directive? No - Patient declined     Chief Complaint  Patient presents with  . Medical Management of Chronic Issues    Patient is here for follow up    HPI: Patient is a 77 y.o. male seen today for follow up   DM II: Lab Results  Component Value Date   HGBA1C 8.4 09/12/2022   A1C going up Worse after taking Tradjenta No foot ulcers, foot pain or numbness.  Did not tolerate amaryl. Insurance did not cover [place].  Recommended starting lantus but he was hesitant at last visit. Endocrinology referral placed per his request. Recommended trying ozempic 09/21/22  Bp not at goal started on losartan 25 recommended going to 50 10/06/22  BP 112-130/68-72, occasional up to 140 systolic  CBG home readings. Before meals 91-92 After meals 158-200 Reduced metformin due to feeling low CBG 90s.  CBG increased with lower   Hx of DDD of cervical and lumbar spine.  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  OSA on CPAP followed by pulmonary   HDL ON zocor Lab Results  Component Value Date   [facility] 103 09/12/2022   PSA 0.69 at [facility] April of 2024 Past Medical History:  Diagnosis Date  . Asthma   . Calcific Achilles tendonitis    bilateral  . Controlled type 2 diabetes mellitus without complication (HCC)   . DDD (degenerative disc disease), cervical   . Gallstone 09/11/2011  . GERD (gastroesophageal reflux disease)   . Hyperlipidemia    mixed  . Insomnia   . Mild cognitive impairment   . Overweight (BMI 25.0-29.9)   . Prostate cancer (HCC)   . Pulmonary nodule/lesion, solitary 06/2004    f/u in 2014, 2015 stable in [facility]  . Sleep apnea   . Slow transit constipation   . Vitamin D deficiency     Past Surgical History:  Procedure Laterality Date  . CERVICAL FUSION  1999 & 2004   implant metal plate  . CHOLECYSTECTOMY  2012  . COLONOSCOPY  12/2015  . EYE SURGERY  1997   laser vision correction  . palatouvulopasty  1995  . PROSTATE SURGERY  09/11/2011   prostatectomy for prostate cancer   . TONSILLECTOMY    . WISDOM TOOTH EXTRACTION      Allergies  Allergen Reactions  . Tetracyclines & Related Other (See Comments)    hepatitis  . Niacin And Related Other (See Comments)    headache  . Wellbutrin [Bupropion] Other (See Comments)    dizziness    Outpatient Encounter Medications as of 11/20/2022  Medication Sig  . b complex vitamins capsule Take 1 capsule by mouth daily.  . Coenzyme Q10 (CO Q 10) 100 MG CAPS Take 2 capsules by mouth daily.  . fexofenadine (ALLEGRA) 60 MG tablet Take 60 mg by mouth daily.  . fluocinonide (LIDEX) 0.05 % external solution Apply 1 application. topically 2 (two) times daily as needed.  . polyethylene glycol (MIRALAX / GLYCOLAX) packet Take 17 g by mouth daily.  . Semaglutide,0.25 or 0.5MG /DOS, (OZEMPIC, 0.25 OR 0.5 MG/DOSE,) 2  MG/3ML SOPN Inject 0.25 mg into the skin once a week. Start 0.25 mg weekly for 4 weeks then increased to 0.5 mg weekly  . simvastatin (ZOCOR) 40 MG tablet TAKE ONE TABLET BY MOUTH EVERY EVENING  . Turmeric 500 MG CAPS Take 500 mg by mouth. Twice a week  . Vitamin D, Ergocalciferol, (DRISDOL) 1.25 MG (50000 UNIT) CAPS capsule TAKE ONE CAPSULE BY MOUTH EVERY WEEK  . losartan (COZAAR) 50 MG tablet Take 1 tablet (50 mg total) by mouth daily. (Patient not taking: Reported on 11/20/2022)  . [DISCONTINUED] Semaglutide,0.25 or 0.5MG /DOS, (OZEMPIC, 0.25 OR 0.5 MG/DOSE,) 2 MG/3ML SOPN Inject 0.25 mg into the skin once a week. Start 0.25 mg weekly for 4 weeks then increased to 0.5 mg weekly   No facility-administered encounter  medications on file as of 11/20/2022.    Review of Systems:  Review of Systems  Constitutional:  Negative for activity change, appetite change, chills and diaphoresis.       Feels shaky  when bp low  HENT:  Negative for congestion.   Eyes:  Negative for photophobia, pain, discharge, redness, itching and visual disturbance.  Respiratory:  Negative for cough and shortness of breath.   Cardiovascular:  Negative for chest pain and leg swelling.  Gastrointestinal:  Negative for abdominal distention, constipation and diarrhea.  Genitourinary:  Negative for difficulty urinating and dysuria.  Musculoskeletal:  Negative for back pain.  Skin:  Negative for wound.  Neurological:  Negative for tremors and weakness.  Psychiatric/Behavioral:  Negative for confusion.     Health Maintenance  Topic Date Due  . FOOT EXAM  11/15/2022  . INFLUENZA VACCINE  12/28/2022  . HEMOGLOBIN A1C  03/14/2023  . Medicare Annual Wellness (AWV)  04/13/2023  . OPHTHALMOLOGY EXAM  08/21/2023  . Diabetic kidney evaluation - Urine ACR  09/20/2023  . Diabetic kidney evaluation - eGFR measurement  10/05/2023  . DTaP/Tdap/Td (3 - Td or Tdap) 05/07/2027  . Pneumonia Vaccine 79+ Years old  Completed  . Hepatitis C Screening  Completed  . Zoster Vaccines- Shingrix  Completed  . HPV VACCINES  Aged Out  . Colonoscopy  Discontinued  . COVID-19 Vaccine  Discontinued    Physical Exam: Vitals:   11/20/22 1542  BP: 130/76  Pulse: 68  Resp: 17  Temp: 98 F (36.7 C)  TempSrc: Temporal  SpO2: 97%  Weight: 198 lb 6.4 oz (90 kg)  Height: 5' 11.5" (1.816 m)   Body mass index is 27.29 kg/m.  Wt Readings from Last 3 Encounters:  11/20/22 198 lb 6.4 oz (90 kg)  09/18/22 210 lb 6.4 oz (95.4 kg)  06/06/22 208 lb (94.3 kg)    Physical Exam Vitals and nursing note reviewed.  Cardiovascular:     Rate and Rhythm: Normal rate and regular rhythm.  Pulmonary:     Effort: Pulmonary effort is normal.     Breath sounds:  Normal breath sounds.  Musculoskeletal:     Right lower leg: No edema.     Left lower leg: No edema.  Neurological:     General: No focal deficit present.     Mental Status: Mental status is at baseline.  Psychiatric:        Mood and Affect: Mood normal.    Labs reviewed: Basic Metabolic Panel: Recent Labs    04/25/22 0600 09/12/22 0000 10/05/22 0000  NA 138 136* 132*  K 5.0 4.2 4.8  CL 100 100 96*  CO2 25* 24* 24*  BUN 14 13  16  CREATININE 1.0 1.1 1.1  CALCIUM 9.8 9.5 10.0  TSH  --  3.49  --    Liver Function Tests: Recent Labs    09/12/22 0000  AST 25  ALT 38  ALKPHOS 56  ALBUMIN 4.4   No results for input(s): "LIPASE", "AMYLASE" in the last 8760 hours. No results for input(s): "AMMONIA" in the last 8760 hours. CBC: Recent Labs    09/12/22 0000  WBC 5.0  HGB 14.2  HCT 43  PLT 199   Lipid Panel: Recent Labs    09/12/22 0000  CHOL 171  HDL 37  LDLCALC 103  TRIG 098   Lab Results  Component Value Date   HGBA1C 8.4 09/12/2022    Procedures since last visit: No results found.  Assessment/Plan  1. Primary hypertension Improved Continue losartan 25 mg qd  2. Uncontrolled diabetes mellitus with hyperglycemia, without long-term current use of insulin (HCC) Improved blood sugars and weight loss Tolerating well  Needs A1C and BMP prior to next apt Keep metformin at 1000 mg bid Can take senokot if having constipation with semaglutide   Labs/tests ordered:  * No order type specified *BMP A1C prior to apt Next apt with DR Chales Abrahams 01/16/23   Total time :  time greater than 50% of total time spent doing pt counseling and coordination of care

## 2022-11-20 ENCOUNTER — Encounter: Payer: Self-pay | Admitting: Adult Health

## 2022-11-20 ENCOUNTER — Non-Acute Institutional Stay: Payer: Medicare Other | Admitting: Adult Health

## 2022-11-20 ENCOUNTER — Telehealth: Payer: Self-pay

## 2022-11-20 VITALS — BP 130/76 | HR 68 | Temp 98.0°F | Resp 17 | Ht 71.5 in | Wt 198.4 lb

## 2022-11-20 DIAGNOSIS — I10 Essential (primary) hypertension: Secondary | ICD-10-CM | POA: Diagnosis not present

## 2022-11-20 DIAGNOSIS — E1165 Type 2 diabetes mellitus with hyperglycemia: Secondary | ICD-10-CM

## 2022-11-20 MED ORDER — LOSARTAN POTASSIUM 50 MG PO TABS: 25.0000 mg | ORAL_TABLET | Freq: Every day | ORAL | 1 refills | Status: DC

## 2022-11-20 MED ORDER — OZEMPIC (0.25 OR 0.5 MG/DOSE) 2 MG/3ML ~~LOC~~ SOPN: 0.2500 mg | PEN_INJECTOR | SUBCUTANEOUS | 1 refills | Status: DC

## 2022-11-20 MED ORDER — METFORMIN HCL 1000 MG PO TABS: 1000.0000 mg | ORAL_TABLET | Freq: Two times a day (BID) | ORAL | 3 refills | Status: DC

## 2022-11-20 NOTE — Telephone Encounter (Signed)
Pharmacy send a rx for ozempic and medication was send into pharmacy

## 2022-11-21 ENCOUNTER — Encounter: Payer: Self-pay | Admitting: Adult Health

## 2022-11-21 MED ORDER — SEMAGLUTIDE (1 MG/DOSE) 4 MG/3ML ~~LOC~~ SOPN: 1.0000 mg | PEN_INJECTOR | SUBCUTANEOUS | 1 refills | Status: DC

## 2022-12-08 ENCOUNTER — Ambulatory Visit: Payer: Medicare Other | Admitting: Adult Health

## 2022-12-18 ENCOUNTER — Encounter: Payer: Self-pay | Admitting: Adult Health

## 2022-12-18 ENCOUNTER — Ambulatory Visit (INDEPENDENT_AMBULATORY_CARE_PROVIDER_SITE_OTHER): Payer: Medicare Other

## 2022-12-18 ENCOUNTER — Ambulatory Visit: Payer: Medicare Other | Admitting: Adult Health

## 2022-12-18 VITALS — BP 118/70 | HR 81 | Ht 71.5 in | Wt 199.2 lb

## 2022-12-18 DIAGNOSIS — D869 Sarcoidosis, unspecified: Secondary | ICD-10-CM | POA: Diagnosis not present

## 2022-12-18 DIAGNOSIS — D860 Sarcoidosis of lung: Secondary | ICD-10-CM | POA: Diagnosis not present

## 2022-12-18 DIAGNOSIS — G4733 Obstructive sleep apnea (adult) (pediatric): Secondary | ICD-10-CM

## 2022-12-18 MED ORDER — ALBUTEROL SULFATE HFA 108 (90 BASE) MCG/ACT IN AERS: 1.0000 | INHALATION_SPRAY | Freq: Four times a day (QID) | RESPIRATORY_TRACT | 2 refills | Status: DC | PRN

## 2022-12-18 NOTE — Progress Notes (Signed)
@Patient  ID: Robert Norman, male    DOB: 1946-05-27, 77 y.o.   MRN: [id number]  Chief Complaint  Patient presents with   Follow-up    Referring provider: Mahlon Gammon, MD  HPI: 77 year old male never smoker followed for obstructive sleep apnea and Sarcoidosis  Previously followed with Dr. Jason Coop , [facility] [place].  Diagnosed with OSA  in 2002 Sarcoidosis (lymphadenopathy and pulmonary nodularity) diagnosed in 2019 after cervical lymph node biopsy,   Medical history significant for diabetes, prostate cancer, hepatitis C   TEST/EVENTS :  11/2017-PFT- FVC 4.34 (96 predicted), FEF ratio 79, FEV1 3.43 (104 predicted), DLCO 92, patient did not want to complete postbronchodilator testing.  Nitrogen washout was performed due to patient being unable to perform Pleth   11/09/2017-PET scan- fairly extensive metastases disease of uncertain origin, multiple sites of uptake 10/19/2017-CT high-res- no ILD, multiple pulmonary nodules scattered throughout lungs bilaterally    Split PSG 12/2013 AHI 38/h, RDI 64/h, lowest sat 88% >> CPAP 9 cm ,210 lbs  10/2000 NPSG >> AHI 75/h 09/2003 CPAP 10 cm  ACE -70   Pets: No pets, no exposure to birds, farm animals Occupation: He was in [employer] and reports exposure to asbestos.  He later worked in the Systems developer Exposures: Exposure to asbestos.  No mold, dampness, hot tub, Jacuzzi Smoking history: Never smoker Travel history: Lived in [place] in [place].  He was posted in [place] while in the [employer].    12/18/2022 Follow up: OSA and Sarcoid  Patient presents for a 1 year follow-up.  Patient has underlying severe sleep . apnea is on nocturnal CPAP .  Patient says he is doing well on CPAP.  Cannot sleep without it.  Feels that he benefits from CPAP with decreased daytime sleepiness.  CPAP download shows excellent compliance with 97% usage.  Daily average usage at 7.5 hours.  Patient is on CPAP 9 cm H2O.  AHI 1.7/hour.  Uses a fullface  mask. Uses it with naps as well. Uses [employer] [facility].   Patient has underlying sarcoidosis originally diagnosed in 2019 with cervical lymph node biopsy.  Was briefly treated with steroids.  Follow-up PET scan in April 2023 showed hypermetabolic lymphadenopathy resolved.  And improvement in pulmonary nodularity.  No suspicious osseous activity.  Patient does have an intermittent dry cough. Does have some post nasal drainage intermittently. No fever or discolored mucus. No rash.  No GERD symptoms.   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 Has filed a claim from exposure to chemical while serving.    Allergies  Allergen Reactions   Tetracyclines & Related Other (See Comments)    hepatitis   Niacin And Related Other (See Comments)    headache   Wellbutrin [Bupropion] Other (See Comments)    dizziness    Immunization History  Administered Date(s) Administered   Influenza, High Dose Seasonal PF 03/04/2018, 03/07/2019, 03/04/2020, 04/13/2020, 03/23/2021   Influenza-Unspecified 03/12/2014, 03/02/2016, 02/26/2017, 03/21/2022   Moderna Sars-Covid-2 Vaccination 04/13/2020   PFIZER(Purple Top)SARS-COV-2 Vaccination 05/31/2019, 06/18/2019   Pneumococcal Conjugate-13 08/04/2016   Pneumococcal Polysaccharide-23 04/05/2010, 04/17/2019   Td 05/06/2017   Tdap 03/12/2014   Zoster Recombinant(Shingrix) 01/31/2017, 02/26/2017, 03/29/2017   Zoster, Live 07/19/2007    Past Medical History:  Diagnosis Date   Asthma    Calcific Achilles tendonitis    bilateral   Controlled type 2 diabetes mellitus without complication (HCC)    DDD (degenerative disc disease), cervical    Gallstone 09/11/2011   GERD (gastroesophageal reflux  disease)    Hyperlipidemia    mixed   Insomnia    Mild cognitive impairment    Overweight (BMI 25.0-29.9)    Prostate cancer (HCC)    Pulmonary nodule/lesion, solitary 06/2004   f/u in 2014, 2015 stable in [facility]   Sleep apnea    Slow transit constipation    Vitamin D deficiency      Tobacco History: Social History   Tobacco Use  Smoking Status Never  Smokeless Tobacco Never   Counseling given: Not Answered   Outpatient Medications Prior to Visit  Medication Sig Dispense Refill   b complex vitamins capsule Take 1 capsule by mouth daily.     Coenzyme Q10 (CO Q 10) 100 MG CAPS Take 2 capsules by mouth daily.     fexofenadine (ALLEGRA) 60 MG tablet Take 60 mg by mouth daily.     fluocinonide (LIDEX) 0.05 % external solution Apply 1 application. topically 2 (two) times daily as needed. 60 mL 0   losartan (COZAAR) 50 MG tablet Take 0.5 tablets (25 mg total) by mouth daily. 30 tablet 1   metFORMIN (GLUCOPHAGE) 1000 MG tablet Take 1 tablet (1,000 mg total) by mouth 2 (two) times daily with a meal. 180 tablet 3   polyethylene glycol (MIRALAX / GLYCOLAX) packet Take 17 g by mouth daily.     Semaglutide, 1 MG/DOSE, 4 MG/3ML SOPN Inject 1 mg as directed once a week. 3 mL 1   simvastatin (ZOCOR) 40 MG tablet TAKE ONE TABLET BY MOUTH EVERY EVENING 90 tablet 3   Turmeric 500 MG CAPS Take 500 mg by mouth. Twice a week     Vitamin D, Ergocalciferol, (DRISDOL) 1.25 MG (50000 UNIT) CAPS capsule TAKE ONE CAPSULE BY MOUTH EVERY WEEK 12 capsule 1   No facility-administered medications prior to visit.     Review of Systems:   Constitutional:   No  weight loss, night sweats,  Fevers, chills, fatigue, or  lassitude.  HEENT:   No headaches,  Difficulty swallowing,  Tooth/dental problems, or  Sore throat,                No sneezing, itching, ear ache, nasal congestion, post nasal drip,   CV:  No chest pain,  Orthopnea, PND, swelling in lower extremities, anasarca, dizziness, palpitations, syncope.   GI  No heartburn, indigestion, abdominal pain, nausea, vomiting, diarrhea, change in bowel habits, loss of appetite, bloody stools.   Resp: .  No chest wall deformity  Skin: no rash or lesions.  GU: no dysuria, change in color of urine, no urgency or frequency.  No flank pain,  no hematuria   MS:  No joint pain or swelling.  No decreased range of motion.  No back pain.    Physical Exam  BP 118/70 (BP Location: Left Arm, Patient Position: Sitting, Cuff Size: Normal)   Pulse 81   Ht 5' 11.5" (1.816 m)   Wt 199 lb 3.2 oz (90.4 kg)   SpO2 99%   BMI 27.40 kg/m   GEN: A/Ox3; pleasant , NAD, well nourished    HEENT:  [place]/AT,  NOSE-clear, THROAT-clear, no lesions, no postnasal drip or exudate noted.   NECK:  Supple w/ fair ROM; no JVD; normal carotid impulses w/o bruits; no thyromegaly or nodules palpated; no lymphadenopathy.    RESP  Clear  P & A; w/o, wheezes/ rales/ or rhonchi. no accessory muscle use, no dullness to percussion  CARD:  RRR, no m/r/g, no peripheral edema, pulses intact, no  cyanosis or clubbing.  GI:   Soft & nt; nml bowel sounds; no organomegaly or masses detected.   Musco: Warm bil, no deformities or joint swelling noted.   Neuro: alert, no focal deficits noted.    Skin: Warm, no lesions or rashes    Lab Results:  CBC   ProBNP No results found for: "PROBNP"  Imaging: No results found.  Administration History     None          Latest Ref Rng & Units 12/25/2017    3:04 PM  PFT Results  FVC-Pre L 4.34   FVC-Predicted Pre % 96   Pre FEV1/FVC % % 79   FEV1-Pre L 3.43   FEV1-Predicted Pre % 104   DLCO uncorrected ml/min/mmHg 31.15   DLCO UNC% % 92   DLVA Predicted % 95     Lab Results  Component Value Date   NITRICOXIDE 16 10/08/2017        Assessment & Plan:   OSA on CPAP Excellent control and compliance - continue same setting.   Plan  Patient Instructions  Continue on CPAP At bedtime .  Keep up good work.  Do not drive if sleepy  Chest xray today.  Activity as tolerated.  Yearly eye exam.  Follow up with Dr. Vassie Loll  or Stepfanie Yott NP in 1 year and As needed        Sarcoidosis PET scan 2023 improved, did not appear to be active. Has chronic cough ? Etiology . Possible upper airway cough from  drainage.  Check chest xray today. Check PFT on return.  Add Allegra and delsym . Albuterol inhaler As needed    Plan  Patient Instructions  Continue on CPAP At bedtime .  Keep up good work.  Do not drive if sleepy  Chest xray today.  Activity as tolerated.  Yearly eye exam.  Try Allegra daily for 2 weeks and then As needed   Try Delsym 2 tsp Twice daily  As needed  cough.  No Mints . Albuterol inhaler 1-2 puffs every 6hrs As needed    Use sips of water to soothe throat and avoid throat clearing and coughing.  Follow up with Dr. Vassie Loll  or Tryston Gilliam NP in 4 months with Spirometry with DLCO .        Rubye Oaks, NP 12/18/2022

## 2022-12-18 NOTE — Assessment & Plan Note (Addendum)
PET scan 2023 improved, did not appear to be active. Has chronic cough ? Etiology . Possible upper airway cough from drainage.  Check chest xray today. Check PFT on return.  Add Allegra and delsym . Albuterol inhaler As needed    Plan  Patient Instructions  Continue on CPAP At bedtime .  Keep up good work.  Do not drive if sleepy  Chest xray today.  Activity as tolerated.  Yearly eye exam.  Try Allegra daily for 2 weeks and then As needed   Try Delsym 2 tsp Twice daily  As needed  cough.  No Mints . Albuterol inhaler 1-2 puffs every 6hrs As needed    Use sips of water to soothe throat and avoid throat clearing and coughing.  Follow up with Dr. Vassie Loll  or Obie Silos NP in 4 months with Spirometry with DLCO .

## 2022-12-18 NOTE — Patient Instructions (Addendum)
Continue on CPAP At bedtime .  Keep up good work.  Do not drive if sleepy  Chest xray today.  Activity as tolerated.  Yearly eye exam.  Try Allegra daily for 2 weeks and then As needed   Try Delsym 2 tsp Twice daily  As needed  cough.  No Mints . Albuterol inhaler 1-2 puffs every 6hrs As needed    Use sips of water to soothe throat and avoid throat clearing and coughing.  Follow up with Dr. Vassie Loll  or Copper Basnett NP in 4 months with Spirometry with DLCO .

## 2022-12-18 NOTE — Assessment & Plan Note (Signed)
Excellent control and compliance - continue same setting.   Plan  Patient Instructions  Continue on CPAP At bedtime .  Keep up good work.  Do not drive if sleepy  Chest xray today.  Activity as tolerated.  Yearly eye exam.  Follow up with Dr. Vassie Loll  or Wane Mollett NP in 1 year and As needed

## 2023-01-08 ENCOUNTER — Other Ambulatory Visit: Payer: Self-pay | Admitting: Internal Medicine

## 2023-01-09 ENCOUNTER — Telehealth: Payer: Self-pay

## 2023-01-09 NOTE — Telephone Encounter (Signed)
Refill request received from [facility] pharmacy for metformin 1,000 mg tablet take one and one half tablet twice a day. Active medication list says to tke one tablet twice a day. Please clarify what patient is to be taking.  Message sent to Dr. Einar Crow

## 2023-01-10 NOTE — Telephone Encounter (Signed)
Noted  

## 2023-01-10 NOTE — Telephone Encounter (Signed)
Keep the dose at 1000 mg BID for now. Not 1500 mg.

## 2023-01-16 ENCOUNTER — Encounter: Payer: Medicare Other | Admitting: Internal Medicine

## 2023-01-16 ENCOUNTER — Telehealth: Payer: Self-pay | Admitting: Internal Medicine

## 2023-01-16 NOTE — Telephone Encounter (Signed)
Patients appointment was cancelled as he did not get blood work before his appointment He does have appointment with Neysa Bonito in 2 weeks with labs before that He did decrease his Cozaar to 25 mg Every other day Also planning to change his Ozempic to 1mg  dose tomorrow from 0.5 mg. Talked about side effects  CBGS are 130 Fasting [id number] After meals

## 2023-01-30 DIAGNOSIS — E1165 Type 2 diabetes mellitus with hyperglycemia: Secondary | ICD-10-CM | POA: Diagnosis not present

## 2023-01-30 DIAGNOSIS — I10 Essential (primary) hypertension: Secondary | ICD-10-CM | POA: Diagnosis not present

## 2023-01-30 LAB — COMPREHENSIVE METABOLIC PANEL
Calcium: 9.9 (ref 8.7–10.7)
eGFR: 70

## 2023-01-30 LAB — BASIC METABOLIC PANEL
BUN: 13 (ref 4–21)
CO2: 25 — AB (ref 13–22)
Chloride: 96 — AB (ref 99–108)
Creatinine: 1.1 (ref 0.6–1.3)
Glucose: 113
Sodium: 137 (ref 137–147)

## 2023-01-30 LAB — HEMOGLOBIN A1C: Hemoglobin A1C: 6.5

## 2023-02-05 ENCOUNTER — Non-Acute Institutional Stay: Payer: Medicare Other | Admitting: Adult Health

## 2023-02-05 ENCOUNTER — Encounter: Payer: Self-pay | Admitting: Adult Health

## 2023-02-05 VITALS — BP 128/78 | HR 78 | Temp 98.2°F | Resp 17 | Ht 71.5 in | Wt 198.0 lb

## 2023-02-05 DIAGNOSIS — K5901 Slow transit constipation: Secondary | ICD-10-CM

## 2023-02-05 DIAGNOSIS — E782 Mixed hyperlipidemia: Secondary | ICD-10-CM

## 2023-02-05 DIAGNOSIS — I10 Essential (primary) hypertension: Secondary | ICD-10-CM

## 2023-02-05 DIAGNOSIS — E1169 Type 2 diabetes mellitus with other specified complication: Secondary | ICD-10-CM | POA: Diagnosis not present

## 2023-02-05 MED ORDER — SENNA-DOCUSATE SODIUM 8.6-50 MG PO TABS: 2.0000 | ORAL_TABLET | Freq: Two times a day (BID) | ORAL | Status: DC

## 2023-02-05 NOTE — Progress Notes (Addendum)
Location:  [facility]  POS: Clinic  Provider: Fletcher Anon, ANP   Goals of Care:     02/05/2023    3:14 PM  Advanced Directives  Does Patient Have a Medical Advance Directive? Yes  Type of Advance Directive Healthcare Power of Attorney  Does patient want to make changes to medical advance directive? No - Patient declined  Copy of Healthcare Power of Attorney in Chart? Yes - validated most recent copy scanned in chart (See row information)     Chief Complaint  Patient presents with   Medical Management of Chronic Issues    Patient is being seen for 2 month follow up and discuss labs    Immunizations    Patient is due for a flu vaccine     HPI: Patient is a 77 y.o. male seen today for medical management of chronic diseases.    Had oral surgery due to a split molar and repair, add bone graft, and was on a soft diet one month ago. Did have trouble eating but now has improved.   A1C is 6.5, down from 8.4 Has decreased taste sensation, reduced appetite, and constipation Takes miralax daily for constipation has a BM q 3 days.  Takes senokot s also Did have some dizziness and nausea transiently after dose increase of semaglutide During this time his blood sugar and blood pressure were normal  BP is controlled on losartan  LDL 103 09/12/22  Hx of Sarcoidosis and sleep apnea, followed by pulmonary Has a chronic cough and nasal congestion and sputum production.  Taking Mucinex DM and allegra which is helping.  Has not need albuterol. Past Medical History:  Diagnosis Date   Asthma    Calcific Achilles tendonitis    bilateral   Controlled type 2 diabetes mellitus without complication (HCC)    DDD (degenerative disc disease), cervical    Gallstone 09/11/2011   GERD (gastroesophageal reflux disease)    Hyperlipidemia    mixed   Insomnia    Mild cognitive impairment    Overweight (BMI 25.0-29.9)    Prostate cancer (HCC)    Pulmonary nodule/lesion, solitary 06/2004   f/u  in 2014, 2015 stable in [facility]   Sleep apnea    Slow transit constipation    Vitamin D deficiency     Past Surgical History:  Procedure Laterality Date   CERVICAL FUSION  1999 & 2004   implant metal plate   CHOLECYSTECTOMY  2012   COLONOSCOPY  12/2015   EYE SURGERY  1997   laser vision correction   palatouvulopasty  1995   PROSTATE SURGERY  09/11/2011   prostatectomy for prostate cancer    TONSILLECTOMY     WISDOM TOOTH EXTRACTION      Allergies  Allergen Reactions   Tetracyclines & Related Other (See Comments)    hepatitis   Niacin And Related Other (See Comments)    headache   Wellbutrin [Bupropion] Other (See Comments)    dizziness    Outpatient Encounter Medications as of 02/05/2023  Medication Sig   albuterol (VENTOLIN HFA) 108 (90 Base) MCG/ACT inhaler Inhale 1-2 puffs into the lungs every 6 (six) hours as needed.   b complex vitamins capsule Take 1 capsule by mouth daily.   Coenzyme Q10 (CO Q 10) 100 MG CAPS Take 2 capsules by mouth daily.   fexofenadine (ALLEGRA) 60 MG tablet Take 60 mg by mouth daily.   fluocinonide (LIDEX) 0.05 % external solution Apply 1 application. topically 2 (two) times daily as  needed.   losartan (COZAAR) 50 MG tablet Take 0.5 tablets (25 mg total) by mouth daily.   metFORMIN (GLUCOPHAGE) 1000 MG tablet Take 1 tablet (1,000 mg total) by mouth 2 (two) times daily with a meal.   polyethylene glycol (MIRALAX / GLYCOLAX) packet Take 17 g by mouth daily.   Semaglutide, 1 MG/DOSE, 4 MG/3ML SOPN Inject 1 mg as directed once a week.   simvastatin (ZOCOR) 40 MG tablet TAKE ONE TABLET BY MOUTH EVERY EVENING   Turmeric 500 MG CAPS Take 500 mg by mouth. Twice a week   Vitamin D, Ergocalciferol, (DRISDOL) 1.25 MG (50000 UNIT) CAPS capsule TAKE ONE CAPSULE BY MOUTH EVERY WEEK   No facility-administered encounter medications on file as of 02/05/2023.    Review of Systems:  Review of Systems  Constitutional:  Positive for fatigue. Negative for activity  change, appetite change, chills, diaphoresis, fever and unexpected weight change.  Respiratory:  Positive for cough. Negative for shortness of breath, wheezing and stridor.   Cardiovascular:  Negative for chest pain, palpitations and leg swelling.  Gastrointestinal:  Positive for constipation. Negative for abdominal distention, abdominal pain and diarrhea.  Genitourinary:  Negative for difficulty urinating and dysuria.  Musculoskeletal:  Negative for arthralgias, back pain, gait problem, joint swelling and myalgias.  Neurological:  Negative for dizziness, seizures, syncope, facial asymmetry, speech difficulty, weakness and headaches.  Hematological:  Negative for adenopathy. Does not bruise/bleed easily.  Psychiatric/Behavioral:  Negative for agitation, behavioral problems and confusion.     Health Maintenance  Topic Date Due   INFLUENZA VACCINE  12/28/2022   Medicare Annual Wellness (AWV)  04/13/2023   HEMOGLOBIN A1C  07/30/2023   OPHTHALMOLOGY EXAM  08/21/2023   Diabetic kidney evaluation - Urine ACR  09/20/2023   FOOT EXAM  11/20/2023   Diabetic kidney evaluation - eGFR measurement  01/30/2024   DTaP/Tdap/Td (3 - Td or Tdap) 05/07/2027   Pneumonia Vaccine 75+ Years old  Completed   Hepatitis C Screening  Completed   Zoster Vaccines- Shingrix  Completed   HPV VACCINES  Aged Out   Colonoscopy  Discontinued   COVID-19 Vaccine  Discontinued    Physical Exam: Vitals:   02/05/23 1513  BP: 128/78  Pulse: 78  Resp: 17  Temp: 98.2 F (36.8 C)  TempSrc: Temporal  SpO2: 96%  Weight: 198 lb (89.8 kg)  Height: 5' 11.5" (1.816 m)   Body mass index is 27.23 kg/m. Physical Exam Vitals and nursing note reviewed.  Constitutional:      Appearance: Normal appearance.  HENT:     Head: Normocephalic and atraumatic.     Right Ear: Tympanic membrane normal.     Left Ear: Tympanic membrane normal.     Nose: Nose normal.     Mouth/Throat:     Mouth: Mucous membranes are moist.      Pharynx: Oropharynx is clear.  Cardiovascular:     Rate and Rhythm: Normal rate and regular rhythm.  Pulmonary:     Effort: Pulmonary effort is normal.     Breath sounds: Normal breath sounds.  Abdominal:     General: Bowel sounds are normal. There is no distension.     Palpations: Abdomen is soft.  Musculoskeletal:     Cervical back: No rigidity or tenderness.  Lymphadenopathy:     Cervical: No cervical adenopathy.  Skin:    General: Skin is warm and dry.  Neurological:     Mental Status: He is alert and oriented to person, place, and  time.  Psychiatric:        Mood and Affect: Mood normal.     Labs reviewed: Basic Metabolic Panel: Recent Labs    04/25/22 0600 09/12/22 0000 10/05/22 0000 01/30/23 0000  NA 138 136* 132* 137  K 5.0 4.2 4.8  --   CL 100 100 96* 96*  CO2 25* 24* 24* 25*  BUN 14 13 16 13   CREATININE 1.0 1.1 1.1 1.1  CALCIUM 9.8 9.5 10.0 9.9  TSH  --  3.49  --   --    Liver Function Tests: Recent Labs    09/12/22 0000  AST 25  ALT 38  ALKPHOS 56  ALBUMIN 4.4   No results for input(s): "LIPASE", "AMYLASE" in the last 8760 hours. No results for input(s): "AMMONIA" in the last 8760 hours. CBC: Recent Labs    09/12/22 0000  WBC 5.0  HGB 14.2  HCT 43  PLT 199   Lipid Panel: Recent Labs    09/12/22 0000  CHOL 171  HDL 37  LDLCALC 103  TRIG 322   Lab Results  Component Value Date   HGBA1C 6.5 01/30/2023    Procedures since last visit: No results found.  Assessment/Plan  1. Controlled type 2 diabetes mellitus with other specified complication, without long-term current use of insulin (HCC) A1C is at goal now after addiing semaglutide Keep metformin Continue semaglutide at 1 mg weekly, the fatigue and constipation may improve over night. If not can consider dose reduction Keep follow ups with ophthalmology  Urine micro <1.2  2. Slow transit constipation Can take miralax bid and senokot s 2 bid, titrate to desirable effect.    3. Primary hypertension Controlled  Continue losartan 25 mg   4. Mixed hyperlipidemia Continue Zocor   Labs/tests ordered:  * No order type specified * BMP A1C in 4 months prior to next apt    Total time :  time greater than 50% of total time spent doing pt counseling and coordination of care

## 2023-02-05 NOTE — Patient Instructions (Addendum)
Recommend Flu and covid vaccine.   Follow up in 4 months with Dr Chales Abrahams Please have labs drawn before your apt  Try miralax twice daily ok to hold for diarrhea

## 2023-02-21 ENCOUNTER — Other Ambulatory Visit: Payer: Self-pay

## 2023-02-21 MED ORDER — SEMAGLUTIDE (1 MG/DOSE) 4 MG/3ML ~~LOC~~ SOPN: 1.0000 mg | PEN_INJECTOR | SUBCUTANEOUS | 1 refills | Status: DC

## 2023-03-21 ENCOUNTER — Encounter (INDEPENDENT_AMBULATORY_CARE_PROVIDER_SITE_OTHER): Payer: Self-pay

## 2023-03-21 ENCOUNTER — Ambulatory Visit (INDEPENDENT_AMBULATORY_CARE_PROVIDER_SITE_OTHER): Payer: Medicare Other | Admitting: Otolaryngology

## 2023-03-21 VITALS — Ht 71.0 in | Wt 196.0 lb

## 2023-03-21 DIAGNOSIS — H6123 Impacted cerumen, bilateral: Secondary | ICD-10-CM

## 2023-03-21 DIAGNOSIS — H903 Sensorineural hearing loss, bilateral: Secondary | ICD-10-CM | POA: Diagnosis not present

## 2023-03-23 DIAGNOSIS — H6123 Impacted cerumen, bilateral: Secondary | ICD-10-CM | POA: Insufficient documentation

## 2023-03-23 NOTE — Progress Notes (Signed)
Patient ID: Robert Norman, male   DOB: March 22, 1946, 77 y.o.   MRN: [id number]  Follow-up: Sensorineural hearing loss, recurrent cerumen impaction  HPI: The patient is a 77 year old male who returns today for his yearly follow-up.  The patient has a history of bilateral high-frequency sensorineural hearing loss.  He was previously fitted with bilateral hearing aids at his local [facility].  The patient also has a history of recurrent cerumen impaction.  He presents today complaining of increasing hearing difficulty.  His hearing was recently tested at the [facility], and did not show any significant change.  Currently he denies any otalgia, otorrhea, and vertigo.  Exam: General: Communicates without difficulty, well nourished, no acute distress. Head: Normocephalic, no evidence injury, no tenderness, facial buttresses intact without stepoff. Eyes: PERRL, EOMI. No scleral icterus, conjunctivae clear. Neuro: CN II exam reveals vision grossly intact.  No nystagmus at any point of gaze. EAC: Bilateral cerumen impaction.  Under the operating microscope, the cerumen is carefully removed with a combination of cerumen currette, alligator forceps, and suction catheters.  After the cerumen is removed, the TMs are noted to be normal.  No mass, erythema, or lesions. Nose: External evaluation reveals normal support and skin without lesions.  Dorsum is intact.  Anterior rhinoscopy reveals healthy pink mucosa over anterior aspect of inferior turbinates and intact septum.  No purulence noted. Oral:  Oral cavity and oropharynx are intact, symmetric, without erythema or edema.  Mucosa is moist without lesions. Neck: Full range of motion without pain.  There is no significant lymphadenopathy.  No masses palpable.  Thyroid bed within normal limits to palpation.  Parotid glands and submandibular glands equal bilaterally without mass.  Trachea is midline. Neuro:  CN 2-12 grossly intact. Gait normal. Vestibular: No nystagmus  at any point of gaze. The cerebellar examination is unremarkable.   Procedure: Bilateral cerumen disimpaction Anesthesia: None Description: Under the operating microscope, the cerumen is carefully removed with a combination of cerumen currette, alligator forceps, and suction catheters.  After the cerumen is removed, the TMs are noted to be normal.  No mass, erythema, or lesions. The patient tolerated the procedure well.   Assessment: Bilateral cerumen impaction.  After the disimpaction procedure, both tympanic membranes and middle ear spaces are noted to be normal.   The patient reports improvement in his hearing after the disimpaction procedure.  His recent hearing test at his [facility] did not show any significant change in his hearing. Bilateral high-frequency sensorineural hearing loss, likely secondary to presbycusis.  Plan: 1.  Otomicroscopy with bilateral cerumen disimpaction. 2.  The physical exam findings are reviewed with the patient. 3.  Continue the use of his hearing aids. 4.  The patient will return for reevaluation in 1 year.

## 2023-03-25 DIAGNOSIS — Z23 Encounter for immunization: Secondary | ICD-10-CM | POA: Diagnosis not present

## 2023-04-02 ENCOUNTER — Other Ambulatory Visit: Payer: Self-pay | Admitting: Internal Medicine

## 2023-04-16 ENCOUNTER — Ambulatory Visit: Payer: Medicare Other | Admitting: Adult Health

## 2023-04-16 ENCOUNTER — Other Ambulatory Visit: Payer: Self-pay

## 2023-04-16 ENCOUNTER — Encounter: Payer: Self-pay | Admitting: Adult Health

## 2023-04-16 VITALS — BP 124/72 | HR 87 | Temp 98.0°F | Resp 16 | Ht 71.0 in | Wt 196.2 lb

## 2023-04-16 DIAGNOSIS — Z0000 Encounter for general adult medical examination without abnormal findings: Secondary | ICD-10-CM | POA: Diagnosis not present

## 2023-04-16 MED ORDER — TADALAFIL 20 MG PO TABS
20.0000 mg | ORAL_TABLET | Freq: Every day | ORAL | 3 refills | Status: AC | PRN
Start: 2023-04-16 — End: ?
  Filled 2024-01-02: qty 30, 30d supply, fill #0

## 2023-04-16 NOTE — Patient Instructions (Signed)
[name] , Thank you for taking time to come for your Medicare Wellness Visit. I appreciate your ongoing commitment to your health goals. Please review the following plan we discussed and let me know if I can assist you in the future.   Screening recommendations/referrals: Colonoscopy aged out Recommended yearly ophthalmology/optometry visit for glaucoma screening and checkup Recommended yearly dental visit for hygiene and checkup  Vaccinations: Influenza vaccine due annually in September/October Pneumococcal vaccine up to date Tdap vaccine up to date Shingles vaccine up to date    Advanced directives: Reviewed   Conditions/risks identified: Cardiac risk   Next appointment: 1 year  Preventive Care 35 Years and Older, Male Preventive care refers to lifestyle choices and visits with your health care provider that can promote health and wellness. What does preventive care include? A yearly physical exam. This is also called an annual well check. Dental exams once or twice a year. Routine eye exams. Ask your health care provider how often you should have your eyes checked. Personal lifestyle choices, including: Daily care of your teeth and gums. Regular physical activity. Eating a healthy diet. Avoiding tobacco and drug use. Limiting alcohol use. Practicing safe sex. Taking low doses of aspirin every day. Taking vitamin and mineral supplements as recommended by your health care provider. What happens during an annual well check? The services and screenings done by your health care provider during your annual well check will depend on your age, overall health, lifestyle risk factors, and family history of disease. Counseling  Your health care provider may ask you questions about your: Alcohol use. Tobacco use. Drug use. Emotional well-being. Home and relationship well-being. Sexual activity. Eating habits. History of falls. Memory and ability to understand  (cognition). Work and work Astronomer. Screening  You may have the following tests or measurements: Height, weight, and BMI. Blood pressure. Lipid and cholesterol levels. These may be checked every 5 years, or more frequently if you are over 4 years old. Skin check. Lung cancer screening. You may have this screening every year starting at age 74 if you have a 30-pack-year history of smoking and currently smoke or have quit within the past 15 years. Fecal occult blood test (FOBT) of the stool. You may have this test every year starting at age 59. Flexible sigmoidoscopy or colonoscopy. You may have a sigmoidoscopy every 5 years or a colonoscopy every 10 years starting at age 21. Prostate cancer screening. Recommendations will vary depending on your family history and other risks. Hepatitis C blood test. Hepatitis B blood test. Sexually transmitted disease (STD) testing. Diabetes screening. This is done by checking your blood sugar (glucose) after you have not eaten for a while (fasting). You may have this done every 1-3 years. Abdominal aortic aneurysm (AAA) screening. You may need this if you are a current or former smoker. Osteoporosis. You may be screened starting at age 40 if you are at high risk. Talk with your health care provider about your test results, treatment options, and if necessary, the need for more tests. Vaccines  Your health care provider may recommend certain vaccines, such as: Influenza vaccine. This is recommended every year. Tetanus, diphtheria, and acellular pertussis (Tdap, Td) vaccine. You may need a Td booster every 10 years. Zoster vaccine. You may need this after age 26. Pneumococcal 13-valent conjugate (PCV13) vaccine. One dose is recommended after age 19. Pneumococcal polysaccharide (PPSV23) vaccine. One dose is recommended after age 18. Talk to your health care provider about which screenings and  vaccines you need and how often you need them. This  information is not intended to replace advice given to you by your health care provider. Make sure you discuss any questions you have with your health care provider. Document Released: 06/11/2015 Document Revised: 02/02/2016 Document Reviewed: 03/16/2015 Elsevier Interactive Patient Education  2017 [employer].  Fall Prevention in the Home Falls can cause injuries. They can happen to people of all ages. There are many things you can do to make your home safe and to help prevent falls. What can I do on the outside of my home? Regularly fix the edges of walkways and driveways and fix any cracks. Remove anything that might make you trip as you walk through a door, such as a raised step or threshold. Trim any bushes or trees on the path to your home. Use bright outdoor lighting. Clear any walking paths of anything that might make someone trip, such as rocks or tools. Regularly check to see if handrails are loose or broken. Make sure that both sides of any steps have handrails. Any raised decks and porches should have guardrails on the edges. Have any leaves, snow, or ice cleared regularly. Use sand or salt on walking paths during winter. Clean up any spills in your garage right away. This includes oil or grease spills. What can I do in the bathroom? Use night lights. Install grab bars by the toilet and in the tub and shower. Do not use towel bars as grab bars. Use non-skid mats or decals in the tub or shower. If you need to sit down in the shower, use a plastic, non-slip stool. Keep the floor dry. Clean up any water that spills on the floor as soon as it happens. Remove soap buildup in the tub or shower regularly. Attach bath mats securely with double-sided non-slip rug tape. Do not have throw rugs and other things on the floor that can make you trip. What can I do in the bedroom? Use night lights. Make sure that you have a light by your bed that is easy to reach. Do not use any sheets or  blankets that are too big for your bed. They should not hang down onto the floor. Have a firm chair that has side arms. You can use this for support while you get dressed. Do not have throw rugs and other things on the floor that can make you trip. What can I do in the kitchen? Clean up any spills right away. Avoid walking on wet floors. Keep items that you use a lot in easy-to-reach places. If you need to reach something above you, use a strong step stool that has a grab bar. Keep electrical cords out of the way. Do not use floor polish or wax that makes floors slippery. If you must use wax, use non-skid floor wax. Do not have throw rugs and other things on the floor that can make you trip. What can I do with my stairs? Do not leave any items on the stairs. Make sure that there are handrails on both sides of the stairs and use them. Fix handrails that are broken or loose. Make sure that handrails are as long as the stairways. Check any carpeting to make sure that it is firmly attached to the stairs. Fix any carpet that is loose or worn. Avoid having throw rugs at the top or bottom of the stairs. If you do have throw rugs, attach them to the floor with carpet tape. Make  sure that you have a light switch at the top of the stairs and the bottom of the stairs. If you do not have them, ask someone to add them for you. What else can I do to help prevent falls? Wear shoes that: Do not have high heels. Have rubber bottoms. Are comfortable and fit you well. Are closed at the toe. Do not wear sandals. If you use a stepladder: Make sure that it is fully opened. Do not climb a closed stepladder. Make sure that both sides of the stepladder are locked into place. Ask someone to hold it for you, if possible. Clearly mark and make sure that you can see: Any grab bars or handrails. First and last steps. Where the edge of each step is. Use tools that help you move around (mobility aids) if they are  needed. These include: Canes. Walkers. Scooters. Crutches. Turn on the lights when you go into a dark area. Replace any light bulbs as soon as they burn out. Set up your furniture so you have a clear path. Avoid moving your furniture around. If any of your floors are uneven, fix them. If there are any pets around you, be aware of where they are. Review your medicines with your doctor. Some medicines can make you feel dizzy. This can increase your chance of falling. Ask your doctor what other things that you can do to help prevent falls. This information is not intended to replace advice given to you by your health care provider. Make sure you discuss any questions you have with your health care provider. Document Released: 03/11/2009 Document Revised: 10/21/2015 Document Reviewed: 06/19/2014 Elsevier Interactive Patient Education  2017 [employer].

## 2023-04-16 NOTE — Progress Notes (Addendum)
Subjective:   Robert Norman is a 77 y.o. male who presents for Medicare Annual/Subsequent preventive examination at [facility].   Visit Complete: In person  Patient Medicare AWV questionnaire was completed by the patient on 04/16/23; I have confirmed that all information answered by patient is correct and no changes since this date.  Cardiac Risk Factors include: advanced age (>53men, >92 women);diabetes mellitus;hypertension     Objective:    Today's Vitals   04/16/23 1355  BP: 124/72  Pulse: 87  Resp: 16  Temp: 98 F (36.7 C)  TempSrc: Temporal  SpO2: 96%  Weight: 196 lb 3.2 oz (89 kg)  Height: 5\' 11"  (1.803 m)   Body mass index is 27.36 kg/m.     04/16/2023    1:56 PM 02/05/2023    3:14 PM 11/20/2022    3:45 PM 09/18/2022    1:59 PM 06/06/2022    1:23 PM 04/25/2022    1:43 PM 04/12/2022    1:49 PM  Advanced Directives  Does Patient Have a Medical Advance Directive? Yes Yes Yes Yes Yes Yes Yes  Type of Estate agent of [place];Living will Healthcare Power of [employer] of [place];Living will;Out of facility DNR (pink MOST or yellow form) Healthcare Power of [employer];Living will;Out of facility DNR (pink MOST or yellow form) Healthcare Power of [employer] Power of Attorney  Does patient want to make changes to medical advance directive? No - Patient declined No - Patient declined No - Patient declined No - Patient declined  No - Patient declined No - Patient declined  Copy of Healthcare Power of Attorney in Chart? Yes - validated most recent copy scanned in chart (See row information) Yes - validated most recent copy scanned in chart (See row information)   Yes - validated most recent copy scanned in chart (See row information)  Yes - validated most recent copy scanned in chart (See row information)    Current Medications (verified) Outpatient Encounter Medications as of 04/16/2023   Medication Sig   albuterol (VENTOLIN HFA) 108 (90 Base) MCG/ACT inhaler Inhale 1-2 puffs into the lungs every 6 (six) hours as needed.   b complex vitamins capsule Take 1 capsule by mouth daily.   Coenzyme Q10 (CO Q 10) 100 MG CAPS Take 2 capsules by mouth daily.   fexofenadine (ALLEGRA) 60 MG tablet Take 60 mg by mouth daily.   fluocinonide (LIDEX) 0.05 % external solution Apply 1 application. topically 2 (two) times daily as needed.   losartan (COZAAR) 50 MG tablet Take 0.5 tablets (25 mg total) by mouth daily.   metFORMIN (GLUCOPHAGE) 1000 MG tablet Take 1 tablet (1,000 mg total) by mouth 2 (two) times daily with a meal.   polyethylene glycol (MIRALAX / GLYCOLAX) packet Take 17 g by mouth 2 (two) times daily.   Semaglutide, 1 MG/DOSE, 4 MG/3ML SOPN Inject 1 mg as directed once a week.   sennosides-docusate sodium (SENOKOT-S) 8.6-50 MG tablet Take 2 tablets by mouth 2 (two) times daily.   simvastatin (ZOCOR) 40 MG tablet TAKE ONE TABLET BY MOUTH EVERY EVENING   tadalafil (CIALIS) 20 MG tablet Take 1 tablet (20 mg total) by mouth daily as needed for erectile dysfunction.   Turmeric 500 MG CAPS Take 500 mg by mouth. Twice a week   Vitamin D, Ergocalciferol, (DRISDOL) 1.25 MG (50000 UNIT) CAPS capsule TAKE ONE CAPSULE BY MOUTH EVERY WEEK   No facility-administered encounter medications on file as of  04/16/2023.    Allergies (verified) Tetracyclines & related, Niacin and related, and Wellbutrin [bupropion]   History: Past Medical History:  Diagnosis Date   Asthma    Calcific Achilles tendonitis    bilateral   Controlled type 2 diabetes mellitus without complication (HCC)    DDD (degenerative disc disease), cervical    Gallstone 09/11/2011   GERD (gastroesophageal reflux disease)    Hyperlipidemia    mixed   Insomnia    Mild cognitive impairment    Overweight (BMI 25.0-29.9)    Prostate cancer (HCC)    Pulmonary nodule/lesion, solitary 06/2004   f/u in 2014, 2015 stable in [facility]    Sleep apnea    Slow transit constipation    Vitamin D deficiency    Past Surgical History:  Procedure Laterality Date   CERVICAL FUSION  1999 & 2004   implant metal plate   CHOLECYSTECTOMY  2012   COLONOSCOPY  12/2015   EYE SURGERY  1997   laser vision correction   palatouvulopasty  1995   PROSTATE SURGERY  09/11/2011   prostatectomy for prostate cancer    TONSILLECTOMY     WISDOM TOOTH EXTRACTION     Family History  Problem Relation Age of Onset   Cancer Mother    Heart disease Father    Heart disease Brother    Diabetes Brother    Colon cancer Neg Hx    Rectal cancer Neg Hx    Stomach cancer Neg Hx    Esophageal cancer Neg Hx    Social History   Socioeconomic History   Marital status: Widowed    Spouse name: Not on file   Number of children: Not on file   Years of education: Not on file   Highest education level: Not on file  Occupational History   Occupation: accounting    Comment: Radiographer, therapeutic  Tobacco Use   Smoking status: Never   Smokeless tobacco: Never  Vaping Use   Vaping status: Never Used  Substance and Sexual Activity   Alcohol use: Not Currently   Drug use: No   Sexual activity: Yes  Other Topics Concern   Not on file  Social History Narrative   Tobacco use, amount per day now: NEVER USED   Past tobacco use, amount per day: NEVER USED   How many years did you use tobacco: NONE   Alcohol use (drinks per week): 10 GLASSES OF WINE A WEEK   Diet:   Do you drink/eat things with caffeine: YES   Marital status:  WIDOWED                    What year were you married?   Do you live in a house, apartment, assisted living, condo, trailer, etc.? APARTMENT   Is it one or more stories? ONE STORY   How many persons live in your home? NONE   Do you have pets in your home?( please list) NO   Current or past profession: FINANCIAL   Do you exercise?  YES                                Type and how often? WALKING 1-2 HOURS   Do you have a living  will? YES   Do you have a DNR form?    NO  If not, do you want to discuss one?   Do you have signed POA/HPOA forms?   YES                     If so, please bring to you appointment   Social Determinants of Health   Financial Resource Strain: Low Risk  (08/08/2017)   Overall Financial Resource Strain (CARDIA)    Difficulty of Paying Living Expenses: Not hard at all  Food Insecurity: No Food Insecurity (12/04/2017)   Hunger Vital Sign    Worried About Running Out of Food in the Last Year: Never true    Ran Out of Food in the Last Year: Never true  Transportation Needs: No Transportation Needs (08/08/2017)   PRAPARE - Administrator, Civil Service (Medical): No    Lack of Transportation (Non-Medical): No  Physical Activity: Sufficiently Active (08/08/2017)   Exercise Vital Sign    Days of Exercise per Week: 7 days    Minutes of Exercise per Session: 60 min  Stress: No Stress Concern Present (12/04/2017)   [employer] of Occupational Health - Occupational Stress Questionnaire    Feeling of Stress : Not at all  Social Connections: Somewhat Isolated (12/04/2017)   Social Connection and Isolation Panel [NHANES]    Frequency of Communication with Friends and Family: More than three times a week    Frequency of Social Gatherings with Friends and Family: More than three times a week    Attends Religious Services: More than 4 times per year    Active Member of [employer] or Organizations: No    Attends Banker Meetings: Never    Marital Status: Widowed    Tobacco Counseling Counseling given: Not Answered   Clinical Intake:     Pain : No/denies pain     BMI - recorded: 27.36 Nutritional Status: BMI 25 -29 Overweight Diabetes: Yes  How often do you need to have someone help you when you read instructions, pamphlets, or other written materials from your doctor or pharmacy?: 1 - Never What is the last grade level you completed in  school?: GRAD SCHOOL  Interpreter Needed?: No      Activities of Daily Living    04/16/2023    3:48 PM 04/16/2023    1:58 PM  In your present state of health, do you have any difficulty performing the following activities:  Hearing? 0 0  Vision? 0 0  Difficulty concentrating or making decisions? 0 0  Walking or climbing stairs? 0 0  Dressing or bathing? 0 0  Doing errands, shopping? 0 0  Preparing Food and eating ? N   Using the Toilet? N   In the past six months, have you accidently leaked urine? N   Do you have problems with loss of bowel control? N   Managing your Medications? N   Managing your Finances? N   Housekeeping or managing your Housekeeping? N     Patient Care Team: Mahlon Gammon, MD as PCP - General (Internal Medicine) Tobias Alexander, OD as Referring Physician (Optometry)  Indicate any recent Medical Services you may have received from other than [facility] providers in the past year (date may be approximate).     Assessment:   This is a routine wellness examination for [place].  Hearing/Vision screen Hearing Screening - Comments:: YES IN THE LAST 12 MONTHS Vision Screening - Comments:: YES 08/2022   Goals Addressed  This Visit's Progress    Manage Constipation-Iron Deficiency       Goal is to have a BM every 1-2 times  Maintain functional health to allow for him to enjoy life and family milestones.        Depression Screen    04/16/2023    1:56 PM 02/05/2023    3:18 PM 11/20/2022    3:45 PM 09/18/2022    1:59 PM 04/25/2022    1:42 PM 04/12/2022    1:51 PM 03/13/2022    2:08 PM  PHQ 2/9 Scores  PHQ - 2 Score 0 0 0 0 0 0 0  PHQ- 9 Score      0   Exception Documentation      Other- indicate reason in comment box   Not completed      AWV     Fall Risk    04/16/2023    1:56 PM 02/05/2023    3:18 PM 11/20/2022    3:45 PM 09/18/2022    1:59 PM 06/06/2022    1:23 PM  Fall Risk   Falls in the past year? 0 0 0 0 0  Number falls in  past yr: 0 0 0 0 0  Injury with Fall? 0 0 0 0 0  Risk for fall due to : No Fall Risks  No Fall Risks  History of fall(s)  Follow up Falls evaluation completed Falls evaluation completed Falls evaluation completed  Falls evaluation completed    MEDICARE RISK AT HOME: Medicare Risk at Home Any stairs in or around the home?: No If so, are there any without handrails?: No Home free of loose throw rugs in walkways, pet beds, electrical cords, etc?: Yes Adequate lighting in your home to reduce risk of falls?: Yes Life alert?: No Use of a cane, walker or w/c?: No Grab bars in the bathroom?: Yes Shower chair or bench in shower?: No Elevated toilet seat or a handicapped toilet?: Yes  TIMED UP AND GO:  Was the test performed?  No    Cognitive Function:    04/16/2023    2:20 PM 12/04/2017   10:09 AM  MMSE - Mini Mental State Exam  Orientation to time 5 4  Orientation to Place 5 5  Registration 3 3  Attention/ Calculation 5 5  Recall 2 2  Language- name 2 objects 2 2  Language- repeat 1 1  Language- follow 3 step command 3 3  Language- read & follow direction 1 1  Write a sentence 1 1  Copy design 1 1  Total score 29 28        04/12/2022    2:04 PM 04/05/2021    1:31 PM 04/02/2020    1:56 PM 04/02/2019   10:17 AM  6CIT Screen  What Year? 0 points 0 points 0 points 0 points  What month? 0 points 0 points 0 points 0 points  What time? 0 points 0 points 0 points 0 points  Count back from 20 0 points 0 points 4 points 0 points  Months in reverse 0 points 0 points 0 points 0 points  Repeat phrase 0 points 0 points 0 points 4 points  Total Score 0 points 0 points 4 points 4 points    Immunizations Immunization History  Administered Date(s) Administered   Fluad Quad(high Dose 65+) 03/20/2023   Fluzone Influenza virus vaccine,trivalent (IIV3), split virus 01/19/2010, 03/29/2011, 02/14/2012, 03/05/2013   Influenza, High Dose Seasonal PF 03/04/2018, 03/07/2019, 03/04/2020,  04/13/2020, 03/23/2021  Influenza-Unspecified 03/12/2014, 03/02/2016, 02/26/2017, 03/21/2022   Moderna Sars-Covid-2 Vaccination 04/13/2020   PFIZER(Purple Top)SARS-COV-2 Vaccination 05/31/2019, 06/18/2019   Pneumococcal Conjugate-13 08/04/2016   Pneumococcal Polysaccharide-23 04/05/2010, 04/17/2019   Td 05/06/2017   Tdap 03/12/2014   Zoster Recombinant(Shingrix) 01/31/2017, 02/26/2017, 03/29/2017   Zoster, Live 07/19/2007    TDAP status: Up to date  Flu Vaccine status: Up to date  Pneumococcal vaccine status: Up to date  Covid-19 vaccine status: Information provided on how to obtain vaccines.   Qualifies for Shingles Vaccine? Yes   Zostavax completed Yes   Shingrix Completed?: Yes  Screening Tests Health Maintenance  Topic Date Due   HEMOGLOBIN A1C  07/30/2023   OPHTHALMOLOGY EXAM  08/21/2023   Diabetic kidney evaluation - Urine ACR  09/20/2023   FOOT EXAM  11/20/2023   Diabetic kidney evaluation - eGFR measurement  01/30/2024   Medicare Annual Wellness (AWV)  04/15/2024   DTaP/Tdap/Td (3 - Td or Tdap) 05/07/2027   Pneumonia Vaccine 46+ Years old  Completed   INFLUENZA VACCINE  Completed   Hepatitis C Screening  Completed   Zoster Vaccines- Shingrix  Completed   HPV VACCINES  Aged Out   Colonoscopy  Discontinued   COVID-19 Vaccine  Discontinued    Health Maintenance  There are no preventive care reminders to display for this patient.   Colorectal cancer screening: No longer required.   Lung Cancer Screening: (Low Dose CT Chest recommended if Age 63-80 years, 20 pack-year currently smoking OR have quit w/in 15years.) does not qualify.   Lung Cancer Screening Referral: NA  Additional Screening:  Hepatitis C Screening: does not qualify; Completed 2021  Vision Screening: Recommended annual ophthalmology exams for early detection of glaucoma and other disorders of the eye. Is the patient up to date with their annual eye exam?  Yes  Who is the provider or what  is the name of the office in which the patient attends annual eye exams?  If pt is not established with a provider, would they like to be referred to a provider to establish care? No .   Dental Screening: Recommended annual dental exams for proper oral hygiene  Diabetic Foot Exam: Diabetic Foot Exam: Completed 11/20/22  Community Resource Referral / Chronic Care Management: CRR required this visit?  No   CCM required this visit?  No     Plan:     I have personally reviewed and noted the following in the patient's chart:   Medical and social history Use of alcohol, tobacco or illicit drugs  Current medications and supplements including opioid prescriptions. Patient is not currently taking opioid prescriptions. Functional ability and status Nutritional status Physical activity Advanced directives List of other physicians Hospitalizations, surgeries, and ER visits in previous 12 months Vitals Screenings to include cognitive, depression, and falls Referrals and appointments  In addition, I have reviewed and discussed with patient certain preventive protocols, quality metrics, and best practice recommendations. A written personalized care plan for preventive services as well as general preventive health recommendations were provided to patient.     Fletcher Anon, NP   04/16/2023   After Visit Summary: Given to patient   Nurse Notes:   Refill given to patient regarding Cialis Discussed trying Amitiza or Linzess for constipation, he will call back to the office with info regarding which one will be covered.

## 2023-04-18 ENCOUNTER — Telehealth: Payer: Self-pay

## 2023-04-18 DIAGNOSIS — K5909 Other constipation: Secondary | ICD-10-CM

## 2023-04-18 MED ORDER — LUBIPROSTONE 24 MCG PO CAPS: 24.0000 ug | ORAL_CAPSULE | Freq: Two times a day (BID) | ORAL | 3 refills | Status: DC

## 2023-04-18 NOTE — Telephone Encounter (Signed)
I have sent the prescription. I would advise that he hold other laxatives that he is using until he sees how effective the lubiprostone (Amitiza) is and can add them back later depending upon his response.

## 2023-04-18 NOTE — Addendum Note (Signed)
Addended by: Esmond Camper on: 04/18/2023 04:50 PM   Modules accepted: Orders

## 2023-04-18 NOTE — Telephone Encounter (Signed)
Patient was advised.  

## 2023-04-18 NOTE — Telephone Encounter (Signed)
Patient called to inform Haydee Monica that his insurance will cover Lubiprostone and he would like a 30 day supply send to [facility].

## 2023-04-20 ENCOUNTER — Encounter: Payer: Self-pay | Admitting: Adult Health

## 2023-04-20 ENCOUNTER — Other Ambulatory Visit: Payer: Self-pay | Admitting: *Deleted

## 2023-04-20 ENCOUNTER — Ambulatory Visit: Payer: Medicare Other | Admitting: Adult Health

## 2023-04-20 ENCOUNTER — Ambulatory Visit: Payer: Medicare Other | Admitting: Pulmonary Disease

## 2023-04-20 VITALS — BP 120/70 | HR 73 | Ht 73.0 in | Wt 199.0 lb

## 2023-04-20 DIAGNOSIS — D869 Sarcoidosis, unspecified: Secondary | ICD-10-CM

## 2023-04-20 DIAGNOSIS — G4733 Obstructive sleep apnea (adult) (pediatric): Secondary | ICD-10-CM

## 2023-04-20 DIAGNOSIS — R053 Chronic cough: Secondary | ICD-10-CM

## 2023-04-20 NOTE — Progress Notes (Signed)
Patient unable to perform test due to excessive cough.

## 2023-04-20 NOTE — Patient Instructions (Addendum)
Continue on CPAP At bedtime .  Keep up good work.  Do not drive if sleepy  Saline nasal spray Twice daily.   Activity as tolerated.  Yearly eye exam.  Allegra daily for 2 weeks and then As needed   Delsym 2 tsp Twice daily  As needed  cough.  Albuterol inhaler 1-2 puffs every 6hrs As needed    Use sips of water to soothe throat and avoid throat clearing and coughing.  Follow up with Dr. Vassie Loll  or Saaya Procell NP in 1 year and As needed

## 2023-04-20 NOTE — Progress Notes (Signed)
@Patient  ID: Robert Norman, male    DOB: 1945-12-30, 77 y.o.   MRN: [id number]  Chief Complaint  Patient presents with   Follow-up    PFT F/U visit.   Discussed the use of AI scribe software for clinical note transcription with the patient, who gave verbal consent to proceed.   Referring provider: Mahlon Gammon, MD  HPI: 77 year old male never smoker followed for obstructive sleep apnea and Sarcoidosis Previously followed with Dr. Jason Coop , [facility] [place].  Diagnosed with OSA  in 2002 Sarcoidosis (lymphadenopathy and pulmonary nodularity) diagnosed in 2019 after cervical lymph node biopsy,    Medical history significant for diabetes, prostate cancer, hepatitis C  [employer]) , Follows with [place]. Has filed a claim from exposure to chemical while serving.    TEST/EVENTS :  11/2017-PFT- FVC 4.34 (96 predicted), FEF ratio 79, FEV1 3.43 (104 predicted), DLCO 92, patient did not want to complete postbronchodilator testing.  Nitrogen washout was performed due to patient being unable to perform Pleth   11/09/2017-PET scan- fairly extensive metastases disease of uncertain origin, multiple sites of uptake 10/19/2017-CT high-res- no ILD, multiple pulmonary nodules scattered throughout lungs bilaterally    Split PSG 12/2013 AHI 38/h, RDI 64/h, lowest sat 88% >> CPAP 9 cm ,210 lbs  10/2000 NPSG >> AHI 75/h 09/2003 CPAP 10 cm   ACE -70   Pets: No pets, no exposure to birds, farm animals Occupation: He was in [employer] and reports exposure to asbestos.  He later worked in the Systems developer Exposures: Exposure to asbestos.  No mold, dampness, hot tub, Jacuzzi Smoking history: Never smoker Travel history: Lived in [place] in [place].  He was posted in [place] while in the [employer].  04/20/2023 Follow up : OSA and Sarcoid  Patient returns for a 34-month follow-up.  Patient has underlying sleep apnea is on CPAP at bedtime.  He says he feels that he benefits from CPAP with  decreased daytime sleepiness.  CPAP download shows excellent compliance with daily average usage at 7 hours.  Patient is on CPAP 9 cm H2O.  AHI 1.0/hour. uses a full face mask.   Patient has a history of underlying sarcoidosis diagnosed in 2019 with a cervical lymph node biopsy.  Was briefly treated with steroids.  Follow-up PET scan September 02, 2021 showed previously hypermetabolic lymphadenopathy in the neck, chest, abdomen has resolved.  No residual or recurrent hypermetabolic adenopathy.  Pulmonary nodularity has improved.  No suspicious osseous activity.  Chest x-ray July 2024 showed clear lungs.  Patient says over overall his breathing is doing okay.  Last visit complained of daily cough- sometimes is productive. Has sinus drainage on occasion.  No increased shortness of breath.  Activity tolerance is at baseline.  Patient was set up for PFTs today but was unfortunately unable to complete.  We discussed yearly eye exams.  Rare use of albuterol.       Allergies  Allergen Reactions   Tetracyclines & Related Other (See Comments)    hepatitis   Niacin And Related Other (See Comments)    headache   Wellbutrin [Bupropion] Other (See Comments)    dizziness    Immunization History  Administered Date(s) Administered   Fluad Quad(high Dose 65+) 03/20/2023   Fluzone Influenza virus vaccine,trivalent (IIV3), split virus 01/19/2010, 03/29/2011, 02/14/2012, 03/05/2013   Influenza, High Dose Seasonal PF 03/04/2018, 03/07/2019, 03/04/2020, 04/13/2020, 03/23/2021   Influenza-Unspecified 03/12/2014, 03/02/2016, 02/26/2017, 03/21/2022   Moderna Sars-Covid-2 Vaccination 04/13/2020   PFIZER(Purple Top)SARS-COV-2  Vaccination 05/31/2019, 06/18/2019   Pneumococcal Conjugate-13 08/04/2016   Pneumococcal Polysaccharide-23 04/05/2010, 04/17/2019   Td 05/06/2017   Tdap 03/12/2014   Zoster Recombinant(Shingrix) 01/31/2017, 02/26/2017, 03/29/2017   Zoster, Live 07/19/2007    Past Medical History:  Diagnosis  Date   Asthma    Calcific Achilles tendonitis    bilateral   Controlled type 2 diabetes mellitus without complication (HCC)    DDD (degenerative disc disease), cervical    Gallstone 09/11/2011   GERD (gastroesophageal reflux disease)    Hyperlipidemia    mixed   Insomnia    Mild cognitive impairment    Overweight (BMI 25.0-29.9)    Prostate cancer (HCC)    Pulmonary nodule/lesion, solitary 06/2004   f/u in 2014, 2015 stable in [facility]   Sleep apnea    Slow transit constipation    Vitamin D deficiency     Tobacco History: Social History   Tobacco Use  Smoking Status Never  Smokeless Tobacco Never   Counseling given: Not Answered   Outpatient Medications Prior to Visit  Medication Sig Dispense Refill   albuterol (VENTOLIN HFA) 108 (90 Base) MCG/ACT inhaler Inhale 1-2 puffs into the lungs every 6 (six) hours as needed. 17 each 2   b complex vitamins capsule Take 1 capsule by mouth daily.     Coenzyme Q10 (CO Q 10) 100 MG CAPS Take 2 capsules by mouth daily.     fexofenadine (ALLEGRA) 60 MG tablet Take 60 mg by mouth daily.     fluocinonide (LIDEX) 0.05 % external solution Apply 1 application. topically 2 (two) times daily as needed. 60 mL 0   losartan (COZAAR) 50 MG tablet Take 0.5 tablets (25 mg total) by mouth daily. 30 tablet 1   lubiprostone (AMITIZA) 24 MCG capsule Take 1 capsule (24 mcg total) by mouth 2 (two) times daily with a meal. 60 capsule 3   metFORMIN (GLUCOPHAGE) 1000 MG tablet Take 1 tablet (1,000 mg total) by mouth 2 (two) times daily with a meal. 180 tablet 3   polyethylene glycol (MIRALAX / GLYCOLAX) packet Take 17 g by mouth 2 (two) times daily.     Semaglutide, 1 MG/DOSE, 4 MG/3ML SOPN Inject 1 mg as directed once a week. 9 mL 1   sennosides-docusate sodium (SENOKOT-S) 8.6-50 MG tablet Take 2 tablets by mouth 2 (two) times daily.     simvastatin (ZOCOR) 40 MG tablet TAKE ONE TABLET BY MOUTH EVERY EVENING 90 tablet 3   tadalafil (CIALIS) 20 MG tablet Take 1  tablet (20 mg total) by mouth daily as needed for erectile dysfunction. 30 tablet 3   Turmeric 500 MG CAPS Take 500 mg by mouth. Twice a week     Vitamin D, Ergocalciferol, (DRISDOL) 1.25 MG (50000 UNIT) CAPS capsule TAKE ONE CAPSULE BY MOUTH EVERY WEEK 12 capsule 1   No facility-administered medications prior to visit.     Review of Systems:   Constitutional:   No  weight loss, night sweats,  Fevers, chills, fatigue, or  lassitude.  HEENT:   No headaches,  Difficulty swallowing,  Tooth/dental problems, or  Sore throat,                No sneezing, itching, ear ache, nasal congestion, post nasal drip,   CV:  No chest pain,  Orthopnea, PND, swelling in lower extremities, anasarca, dizziness, palpitations, syncope.   GI  No heartburn, indigestion, abdominal pain, nausea, vomiting, diarrhea, change in bowel habits, loss of appetite, bloody stools.   Resp:  No shortness of breath with exertion or at rest.  No excess mucus, no productive cough,  No non-productive cough,  No coughing up of blood.  No change in color of mucus.  No wheezing.  No chest wall deformity  Skin: no rash or lesions.  GU: no dysuria, change in color of urine, no urgency or frequency.  No flank pain, no hematuria   MS:  No joint pain or swelling.  No decreased range of motion.  No back pain.    Physical Exam  BP 120/70 (BP Location: Left Arm, Cuff Size: Large)   Pulse 73   Ht 6\' 1"  (1.854 m)   Wt 199 lb (90.3 kg)   SpO2 95%   BMI 26.25 kg/m   GEN: A/Ox3; pleasant , NAD, well nourished    HEENT:  [place]/AT,  EACs-clear, TMs-wnl, NOSE-clear, THROAT-clear, no lesions, no postnasal drip or exudate noted.   NECK:  Supple w/ fair ROM; no JVD; normal carotid impulses w/o bruits; no thyromegaly or nodules palpated; no lymphadenopathy.    RESP  Clear  P & A; w/o, wheezes/ rales/ or rhonchi. no accessory muscle use, no dullness to percussion  CARD:  RRR, no m/r/g, no peripheral edema, pulses intact, no cyanosis or  clubbing.  GI:   Soft & nt; nml bowel sounds; no organomegaly or masses detected.   Musco: Warm bil, no deformities or joint swelling noted.   Neuro: alert, no focal deficits noted.    Skin: Warm, no lesions or rashes    Lab Results:  CBC    BNP No results found for: "BNP"  ProBNP No results found for: "PROBNP"  Imaging: No results found.  Administration History     None          Latest Ref Rng & Units 12/25/2017    3:04 PM  PFT Results  FVC-Pre L 4.34   FVC-Predicted Pre % 96   Pre FEV1/FVC % % 79   FEV1-Pre L 3.43   FEV1-Predicted Pre % 104   DLCO uncorrected ml/min/mmHg 31.15   DLCO UNC% % 92   DLVA Predicted % 95     Lab Results  Component Value Date   NITRICOXIDE 16 10/08/2017        Assessment & Plan:   Assessment and Plan    Obstructive Sleep Apnea Obstructive sleep apnea is well-controlled with CPAP therapy, which they use nightly, resulting in significant improvement in sleep quality. No adjustments to the CPAP settings are necessary at this time.  The plan is to continue the current CPAP therapy, ensure regular cleaning of the CPAP equipment.   Chronic Cough Their chronic cough, likely stemming from postnasal drip and possibly exacerbated by CPAP use, has shown improvement with the use of Mucinex DM. There is no indication of asthma or significant sarcoidosis involvement. We discussed the potential contribution of reflux and the use of Pepcid, though they prefer to avoid inhalers and minimize medication use. The plan includes daily use of Allegra, regular use of saline nasal spray, consideration of Mucinex DM in liquid form if necessary, and potential use of Pepcid if symptoms persist. Unable to perform PFT   Sarcoidosis Sarcoidosis is in remission, with previous imaging showing resolution of lymphadenopathy and improvement in pulmonary nodules. They report an occasional cough, which improves with Mucinex DM. The last chest x-ray in July  was clear, and there have been no significant changes in pulmonary function tests since 2019. The plan is to continue the current management, consider  Allegra for postnasal drip, and use saline nasal spray to alleviate nasal dryness.  General Health Maintenance We discussed the importance of vaccinations, particularly the RSV vaccine, given their history of pneumonia and age. The RSV vaccine is recommended for those over 60 and those with medical conditions, as it may reduce the severity of illness and prevent secondary complications like pneumonia. They are advised to get the RSV vaccine at a pharmacy due to insurance coverage. Flu shot is up to date   Plan  Patient Instructions  Continue on CPAP At bedtime .  Keep up good work.  Do not drive if sleepy  Saline nasal spray Twice daily.   Activity as tolerated.  Yearly eye exam.  Allegra daily for 2 weeks and then As needed   Delsym 2 tsp Twice daily  As needed  cough.  Albuterol inhaler 1-2 puffs every 6hrs As needed    Use sips of water to soothe throat and avoid throat clearing and coughing.  Follow up with Dr. Vassie Loll  or Arnav Cregg NP in 1 year and As needed        Rubye Oaks, NP 04/20/2023

## 2023-04-20 NOTE — Patient Instructions (Signed)
Test not performed due to cough.

## 2023-05-09 ENCOUNTER — Other Ambulatory Visit: Payer: Self-pay

## 2023-05-09 MED ORDER — LOSARTAN POTASSIUM 50 MG PO TABS: 25.0000 mg | ORAL_TABLET | Freq: Every day | ORAL | 1 refills | Status: DC

## 2023-05-15 DIAGNOSIS — E1169 Type 2 diabetes mellitus with other specified complication: Secondary | ICD-10-CM | POA: Diagnosis not present

## 2023-05-15 LAB — BASIC METABOLIC PANEL
BUN: 15 (ref 4–21)
CO2: 23 — AB (ref 13–22)
Chloride: 98 — AB (ref 99–108)
Creatinine: 1 (ref 0.6–1.3)
Glucose: 129
Potassium: 4.3 meq/L (ref 3.5–5.1)
Sodium: 133 — AB (ref 137–147)

## 2023-05-15 LAB — COMPREHENSIVE METABOLIC PANEL
Calcium: 9.8 (ref 8.7–10.7)
eGFR: 80

## 2023-05-15 LAB — HEMOGLOBIN A1C: Hemoglobin A1C: 6.2

## 2023-06-05 DIAGNOSIS — D1801 Hemangioma of skin and subcutaneous tissue: Secondary | ICD-10-CM | POA: Diagnosis not present

## 2023-06-05 DIAGNOSIS — L821 Other seborrheic keratosis: Secondary | ICD-10-CM | POA: Diagnosis not present

## 2023-06-05 DIAGNOSIS — L820 Inflamed seborrheic keratosis: Secondary | ICD-10-CM | POA: Diagnosis not present

## 2023-06-11 DIAGNOSIS — K5909 Other constipation: Secondary | ICD-10-CM | POA: Diagnosis not present

## 2023-06-11 DIAGNOSIS — R198 Other specified symptoms and signs involving the digestive system and abdomen: Secondary | ICD-10-CM | POA: Diagnosis not present

## 2023-06-12 ENCOUNTER — Non-Acute Institutional Stay: Payer: Medicare Other | Admitting: Internal Medicine

## 2023-06-12 ENCOUNTER — Encounter: Payer: Self-pay | Admitting: Internal Medicine

## 2023-06-12 VITALS — BP 130/78 | HR 73 | Temp 98.0°F | Ht 73.0 in | Wt 196.2 lb

## 2023-06-12 DIAGNOSIS — K5909 Other constipation: Secondary | ICD-10-CM

## 2023-06-12 DIAGNOSIS — I10 Essential (primary) hypertension: Secondary | ICD-10-CM

## 2023-06-12 DIAGNOSIS — E782 Mixed hyperlipidemia: Secondary | ICD-10-CM

## 2023-06-12 DIAGNOSIS — E1169 Type 2 diabetes mellitus with other specified complication: Secondary | ICD-10-CM | POA: Diagnosis not present

## 2023-06-12 DIAGNOSIS — D869 Sarcoidosis, unspecified: Secondary | ICD-10-CM | POA: Diagnosis not present

## 2023-06-12 MED ORDER — SEMAGLUTIDE(0.25 OR 0.5MG/DOS) 2 MG/3ML ~~LOC~~ SOPN
0.5000 mg | PEN_INJECTOR | SUBCUTANEOUS | 3 refills | Status: DC
Start: 2023-06-12 — End: 2024-02-05

## 2023-06-15 NOTE — Progress Notes (Signed)
 Location:  [facility] of Service:  Clinic (12)  Provider:   Code Status:  Goals of Care:     06/12/2023    1:45 PM  Advanced Directives  Does Patient Have a Medical Advance Directive? Yes  Type of Estate Agent of [place];Living will  Copy of Healthcare Power of Attorney in Chart? Yes - validated most recent copy scanned in chart (See row information)     Chief Complaint  Patient presents with   Medical Management of Chronic Issues    4 month follow up with labs.    HPI: Patient is a 78 y.o. male seen today for medical management of chronic diseases.   Discussed the use of AI scribe software for clinical note transcription with the patient, who gave verbal consent to proceed.  History of Present Illness   Diabetes Mellitus type 2  Came for follow up for his sugars  He reports that his A1c has improved to 6.2, which he attributes to his medication regimen. However, he expresses dissatisfaction with the constipation side effect of his current diabetes medication, Ozempic . He is considering reducing the dosage to alleviate this issue. Constipation Was seen BY GI  Chronic Cough  He has been managing this symptom with Mucinex DM, which he reports has reduced his coughing significantly. He expresses dissatisfaction with a previously prescribed inhaler by Pulmonary Albuterol  , which he has discontinued.     Sarcoidosis Diagnosed with Lymph node Biopsy Not on Active treatment Follows with Pulmonology Does have Prednisone  Prescription PRN if needed PET scan was negative     H/o Sleep Apnea on CPAP    Liver Lesion MR Abdomen was negative Wife died due to Colon Cancer 7 years ago Has sons and grandsons  Past Medical History:  Diagnosis Date   Asthma    Calcific Achilles tendonitis    bilateral   Controlled type 2 diabetes mellitus without complication (HCC)    DDD (degenerative disc disease), cervical    Gallstone  09/11/2011   GERD (gastroesophageal reflux disease)    Hyperlipidemia    mixed   Insomnia    Mild cognitive impairment    Overweight (BMI 25.0-29.9)    Prostate cancer (HCC)    Pulmonary nodule/lesion, solitary 06/2004   f/u in 2014, 2015 stable in [facility]   Sleep apnea    Slow transit constipation    Vitamin D  deficiency     Past Surgical History:  Procedure Laterality Date   CERVICAL FUSION  1999 & 2004   implant metal plate   CHOLECYSTECTOMY  2012   COLONOSCOPY  12/2015   EYE SURGERY  1997   laser vision correction   palatouvulopasty  1995   PROSTATE SURGERY  09/11/2011   prostatectomy for prostate cancer    TONSILLECTOMY     WISDOM TOOTH EXTRACTION      Allergies  Allergen Reactions   Tetracyclines & Related Other (See Comments)    hepatitis   Niacin And Related Other (See Comments)    headache   Wellbutrin [Bupropion] Other (See Comments)    dizziness    Outpatient Encounter Medications as of 06/12/2023  Medication Sig   b complex vitamins capsule Take 1 capsule by mouth daily.   Coenzyme Q10 (CO Q 10) 100 MG CAPS Take 2 capsules by mouth daily.   Fexofenadine HCl (MUCINEX ALLERGY  PO) Take by mouth.   fluocinonide  (LIDEX ) 0.05 % external solution Apply 1 application. topically 2 (two) times daily as  needed.   losartan  (COZAAR ) 50 MG tablet Take 0.5 tablets (25 mg total) by mouth daily.   metFORMIN  (GLUCOPHAGE ) 1000 MG tablet Take 1 tablet (1,000 mg total) by mouth 2 (two) times daily with a meal.   polyethylene glycol (MIRALAX  / GLYCOLAX ) packet Take 17 g by mouth 2 (two) times daily.   Semaglutide ,0.25 or 0.5MG /DOS, 2 MG/3ML SOPN Inject 0.5 mg into the skin once a week.   sennosides-docusate sodium  (SENOKOT-S) 8.6-50 MG tablet Take 2 tablets by mouth 2 (two) times daily. (Patient taking differently: Take 2 tablets by mouth daily.)   simvastatin  (ZOCOR ) 40 MG tablet TAKE ONE TABLET BY MOUTH EVERY EVENING   tadalafil  (CIALIS ) 20 MG tablet Take 1 tablet (20 mg total)  by mouth daily as needed for erectile dysfunction.   Vitamin D , Ergocalciferol , (DRISDOL ) 1.25 MG (50000 UNIT) CAPS capsule TAKE ONE CAPSULE BY MOUTH EVERY WEEK   [DISCONTINUED] Semaglutide , 1 MG/DOSE, 4 MG/3ML SOPN Inject 1 mg as directed once a week.   Turmeric 500 MG CAPS Take 500 mg by mouth. Twice a week (Patient not taking: Reported on 06/12/2023)   [DISCONTINUED] albuterol  (VENTOLIN  HFA) 108 (90 Base) MCG/ACT inhaler Inhale 1-2 puffs into the lungs every 6 (six) hours as needed. (Patient not taking: Reported on 06/12/2023)   [DISCONTINUED] fexofenadine (ALLEGRA) 60 MG tablet Take 60 mg by mouth daily. (Patient not taking: Reported on 06/12/2023)   [DISCONTINUED] lubiprostone  (AMITIZA ) 24 MCG capsule Take 1 capsule (24 mcg total) by mouth 2 (two) times daily with a meal. (Patient not taking: Reported on 06/12/2023)   No facility-administered encounter medications on file as of 06/12/2023.    Review of Systems:  Review of Systems  Constitutional:  Negative for activity change, appetite change and unexpected weight change.  HENT: Negative.    Respiratory:  Positive for cough. Negative for shortness of breath.   Cardiovascular:  Negative for leg swelling.  Gastrointestinal:  Positive for constipation.  Genitourinary:  Negative for frequency.  Musculoskeletal:  Negative for arthralgias, gait problem and myalgias.  Skin: Negative.  Negative for rash.  Neurological:  Negative for dizziness and weakness.  Psychiatric/Behavioral:  Negative for confusion and sleep disturbance.   All other systems reviewed and are negative.   Health Maintenance  Topic Date Due   OPHTHALMOLOGY EXAM  08/21/2023   Diabetic kidney evaluation - Urine ACR  09/20/2023   HEMOGLOBIN A1C  11/13/2023   FOOT EXAM  11/20/2023   Medicare Annual Wellness (AWV)  04/15/2024   Diabetic kidney evaluation - eGFR measurement  05/14/2024   DTaP/Tdap/Td (3 - Td or Tdap) 05/07/2027   Pneumonia Vaccine 17+ Years old  Completed    INFLUENZA VACCINE  Completed   Hepatitis C Screening  Completed   Zoster Vaccines- Shingrix  Completed   HPV VACCINES  Aged Out   Colonoscopy  Discontinued   COVID-19 Vaccine  Discontinued    Physical Exam: Vitals:   06/12/23 1336  BP: 130/78  Pulse: 73  Temp: 98 F (36.7 C)  SpO2: 98%  Weight: 196 lb 3.2 oz (89 kg)  Height: 6' 1 (1.854 m)   Body mass index is 25.89 kg/m. Physical Exam Vitals reviewed.  Constitutional:      Appearance: Normal appearance.  HENT:     Head: Normocephalic.     Nose: Nose normal.     Mouth/Throat:     Mouth: Mucous membranes are moist.     Pharynx: Oropharynx is clear.  Eyes:     Pupils: Pupils are  equal, round, and reactive to light.  Cardiovascular:     Rate and Rhythm: Normal rate and regular rhythm.     Pulses: Normal pulses.     Heart sounds: No murmur heard. Pulmonary:     Effort: Pulmonary effort is normal. No respiratory distress.     Breath sounds: Normal breath sounds. No rales.  Abdominal:     General: Abdomen is flat. Bowel sounds are normal.     Palpations: Abdomen is soft.  Musculoskeletal:        General: No swelling.     Cervical back: Neck supple.  Skin:    General: Skin is warm.  Neurological:     General: No focal deficit present.     Mental Status: He is alert and oriented to person, place, and time.  Psychiatric:        Mood and Affect: Mood normal.        Thought Content: Thought content normal.     Labs reviewed: Basic Metabolic Panel: Recent Labs    09/12/22 0000 10/05/22 0000 01/30/23 0000 05/15/23 0000  NA 136* 132* 137 133*  K 4.2 4.8  --  4.3  CL 100 96* 96* 98*  CO2 24* 24* 25* 23*  BUN 13 16 13 15   CREATININE 1.1 1.1 1.1 1.0  CALCIUM 9.5 10.0 9.9 9.8  TSH 3.49  --   --   --    Liver Function Tests: Recent Labs    09/12/22 0000  AST 25  ALT 38  ALKPHOS 56  ALBUMIN 4.4   No results for input(s): LIPASE, AMYLASE in the last 8760 hours. No results for input(s): AMMONIA in  the last 8760 hours. CBC: Recent Labs    09/12/22 0000  WBC 5.0  HGB 14.2  HCT 43  PLT 199   Lipid Panel: Recent Labs    09/12/22 0000  CHOL 171  HDL 37  LDLCALC 103  TRIG 843   Lab Results  Component Value Date   HGBA1C 6.2 05/15/2023    Procedures since last visit: No results found.  Assessment/Plan 1. Controlled type 2 diabetes mellitus with other specified complication, without long-term current use of insulin  (HCC) (Primary) A1C is in good Limit Patient wanted to stop his Ozempic  due to constipation But Ozempic  has really helped his to bring his A1c down. We decided to reduce the dose to 0.5.   2. Chronic constipation Did see [facility] and They recommended to reduce the dose of senna and use MiraLAX  instead He is also thinking of reducing the dose of Ozempic  to see if it helps his constipation  3. Primary hypertension Cozaar   4. Mixed hyperlipidemia statin  5. Sarcoidosis Recently saw Pulmonary with no new recommendation    Labs/tests ordered:  A1C and Lipid panel and TSH Next appt:  09/17/2023

## 2023-07-04 LAB — HM DIABETES EYE EXAM

## 2023-07-05 ENCOUNTER — Encounter: Payer: Self-pay | Admitting: Internal Medicine

## 2023-07-09 ENCOUNTER — Other Ambulatory Visit: Payer: Self-pay | Admitting: *Deleted

## 2023-07-09 MED ORDER — LOSARTAN POTASSIUM 50 MG PO TABS: 25.0000 mg | ORAL_TABLET | Freq: Every day | ORAL | 1 refills | Status: DC

## 2023-07-09 NOTE — Telephone Encounter (Signed)
 Pharmacy requested refill

## 2023-08-09 DIAGNOSIS — E1165 Type 2 diabetes mellitus with hyperglycemia: Secondary | ICD-10-CM | POA: Diagnosis not present

## 2023-08-15 ENCOUNTER — Other Ambulatory Visit: Payer: Self-pay | Admitting: Internal Medicine

## 2023-09-11 DIAGNOSIS — E1165 Type 2 diabetes mellitus with hyperglycemia: Secondary | ICD-10-CM | POA: Diagnosis not present

## 2023-09-11 DIAGNOSIS — I10 Essential (primary) hypertension: Secondary | ICD-10-CM | POA: Diagnosis not present

## 2023-09-11 DIAGNOSIS — E785 Hyperlipidemia, unspecified: Secondary | ICD-10-CM | POA: Diagnosis not present

## 2023-09-11 LAB — LIPID PANEL
Cholesterol: 127 (ref 0–200)
HDL: 40 (ref 35–70)
LDL Cholesterol: 59
Triglycerides: 139 (ref 40–160)

## 2023-09-11 LAB — HEMOGLOBIN A1C: Hemoglobin A1C: 7

## 2023-09-11 LAB — TSH: TSH: 2.62 (ref 0.41–5.90)

## 2023-09-17 ENCOUNTER — Encounter: Payer: Self-pay | Admitting: Adult Health

## 2023-09-17 ENCOUNTER — Non-Acute Institutional Stay: Payer: Medicare Other | Admitting: Adult Health

## 2023-09-17 VITALS — BP 130/78 | HR 75 | Temp 97.9°F | Resp 21 | Ht 73.0 in | Wt 202.0 lb

## 2023-09-17 DIAGNOSIS — E559 Vitamin D deficiency, unspecified: Secondary | ICD-10-CM | POA: Diagnosis not present

## 2023-09-17 DIAGNOSIS — D869 Sarcoidosis, unspecified: Secondary | ICD-10-CM | POA: Diagnosis not present

## 2023-09-17 DIAGNOSIS — E1169 Type 2 diabetes mellitus with other specified complication: Secondary | ICD-10-CM | POA: Diagnosis not present

## 2023-09-17 DIAGNOSIS — Z6826 Body mass index (BMI) 26.0-26.9, adult: Secondary | ICD-10-CM | POA: Diagnosis not present

## 2023-09-17 DIAGNOSIS — G4730 Sleep apnea, unspecified: Secondary | ICD-10-CM

## 2023-09-17 DIAGNOSIS — I10 Essential (primary) hypertension: Secondary | ICD-10-CM

## 2023-09-17 MED ORDER — LOSARTAN POTASSIUM 25 MG PO TABS
25.0000 mg | ORAL_TABLET | Freq: Every day | ORAL | 2 refills | Status: AC
  Filled 2024-01-02: qty 90, 90d supply, fill #0
  Filled 2024-05-21: qty 90, 90d supply, fill #1

## 2023-09-17 NOTE — Progress Notes (Addendum)
 Location:  [facility]  POS: Clinic  Provider: Raylene Calamity, ANP  Goals of Care:     06/12/2023    1:45 PM  Advanced Directives  Does Patient Have a Medical Advance Directive? Yes  Type of Estate agent of [place];Living will  Copy of Healthcare Power of Attorney in Chart? Yes - validated most recent copy scanned in chart (See row information)     Chief Complaint  Patient presents with   Follow-up    3 month follow up.     Robert Norman "Synetta Eves" is a 78 year old male with type 2 diabetes who presents for follow-up of his diabetes management.  His recent HbA1c was 7.0% in April, up from 6.6% in March, which he attributes to dietary choices, including consuming desserts like creme brulee donuts. He is on Ozempic  0.5 mg, which causes constipation. Amitiza  was previously tried without success, but his symptoms have improved since adjusting the Ozempic  dose. He is hesitant to increase the Ozempic  dose due to constipation concerns.  He weighs over 200 pounds and acknowledges recent weight gain. He wants to reduce his carbohydrate and sugar intake. He feels more tired and less active than before, attributing some of this to aging. He recently traveled to [place] , which he found tiring.  He has a history of sarcoidosis, which causes a persistent cough. He uses a CPAP machine regularly and has no shortness of breath on exertion. Sarcoidosis is hereditary in his family, affecting his cousin. He denies current cold symptoms, attributing his cough to sarcoidosis. He uses an inhaler for sarcoidosis but finds it ineffective.  He is on simvastatin  for cholesterol management, with recent labs showing an LDL of 59 mg/dL and total cholesterol of 127 mg/dL. He takes a vitamin D  supplement, with a recent level of 57.3 ng/mL.  He has a history of back pain, which occasionally causes twinges, particularly when doing dishes. He manages this by shifting his weight. No issues  with urination or ankle swelling.  Past Medical History:  Diagnosis Date   Asthma    Calcific Achilles tendonitis    bilateral   Controlled type 2 diabetes mellitus without complication (HCC)    DDD (degenerative disc disease), cervical    Gallstone 09/11/2011   GERD (gastroesophageal reflux disease)    Hyperlipidemia    mixed   Insomnia    Mild cognitive impairment    Overweight (BMI 25.0-29.9)    Prostate cancer (HCC)    Pulmonary nodule/lesion, solitary 06/2004   f/u in 2014, 2015 stable in [facility]   Sleep apnea    Slow transit constipation    Vitamin D  deficiency     Past Surgical History:  Procedure Laterality Date   CERVICAL FUSION  1999 & 2004   implant metal plate   CHOLECYSTECTOMY  2012   COLONOSCOPY  12/2015   EYE SURGERY  1997   laser vision correction   palatouvulopasty  1995   PROSTATE SURGERY  09/11/2011   prostatectomy for prostate cancer    TONSILLECTOMY     WISDOM TOOTH EXTRACTION      Allergies  Allergen Reactions   Tetracyclines & Related Other (See Comments)    hepatitis   Niacin And Related Other (See Comments)    headache   Wellbutrin [Bupropion] Other (See Comments)    dizziness    Outpatient Encounter Medications as of 09/17/2023  Medication Sig   b complex vitamins capsule Take 1 capsule by mouth daily.   Coenzyme Q10 (CO  Q 10) 100 MG CAPS Take 2 capsules by mouth daily.   Fexofenadine HCl (MUCINEX ALLERGY  PO) Take by mouth.   fluocinonide  (LIDEX ) 0.05 % external solution APPLY ONE APPLICATION TOPICALLY TWICE A DAY AS NEEDED   losartan  (COZAAR ) 25 MG tablet Take 1 tablet (25 mg total) by mouth daily.   metFORMIN  (GLUCOPHAGE ) 1000 MG tablet Take 1 tablet (1,000 mg total) by mouth 2 (two) times daily with a meal.   polyethylene glycol (MIRALAX  / GLYCOLAX ) packet Take 17 g by mouth 2 (two) times daily.   Semaglutide ,0.25 or 0.5MG /DOS, 2 MG/3ML SOPN Inject 0.5 mg into the skin once a week.   sennosides-docusate sodium  (SENOKOT-S) 8.6-50 MG  tablet Take 2 tablets by mouth 2 (two) times daily. (Patient taking differently: Take 2 tablets by mouth daily.)   simvastatin  (ZOCOR ) 40 MG tablet TAKE ONE TABLET BY MOUTH EVERY EVENING   tadalafil  (CIALIS ) 20 MG tablet Take 1 tablet (20 mg total) by mouth daily as needed for erectile dysfunction.   Turmeric 500 MG CAPS Take 500 mg by mouth. Twice a week   Vitamin D , Ergocalciferol , (DRISDOL ) 1.25 MG (50000 UNIT) CAPS capsule TAKE ONE CAPSULE BY MOUTH EVERY WEEK   [DISCONTINUED] losartan  (COZAAR ) 50 MG tablet Take 0.5 tablets (25 mg total) by mouth daily.   No facility-administered encounter medications on file as of 09/17/2023.    Review of Systems:  Review of Systems  Constitutional:  Positive for fatigue. Negative for activity change, appetite change, chills, diaphoresis, fever and unexpected weight change.  Respiratory:  Positive for cough. Negative for shortness of breath, wheezing and stridor.   Cardiovascular:  Negative for chest pain, palpitations and leg swelling.  Gastrointestinal:  Negative for abdominal distention, abdominal pain, constipation and diarrhea.  Genitourinary:  Negative for difficulty urinating and dysuria.  Musculoskeletal:  Negative for arthralgias, back pain, gait problem, joint swelling and myalgias.  Neurological:  Negative for dizziness, seizures, syncope, facial asymmetry, speech difficulty, weakness and headaches.  Hematological:  Negative for adenopathy. Does not bruise/bleed easily.  Psychiatric/Behavioral:  Negative for agitation, behavioral problems and confusion.     Health Maintenance  Topic Date Due   Diabetic kidney evaluation - Urine ACR  09/20/2023   FOOT EXAM  11/20/2023   INFLUENZA VACCINE  12/28/2023   HEMOGLOBIN A1C  03/12/2024   Medicare Annual Wellness (AWV)  04/15/2024   Diabetic kidney evaluation - eGFR measurement  05/14/2024   OPHTHALMOLOGY EXAM  07/03/2024   DTaP/Tdap/Td (3 - Td or Tdap) 05/07/2027   Pneumonia Vaccine 65+ Years  old  Completed   Hepatitis C Screening  Completed   Zoster Vaccines- Shingrix  Completed   HPV VACCINES  Aged Out   Meningococcal B Vaccine  Aged Out   Colonoscopy  Discontinued   COVID-19 Vaccine  Discontinued    Physical Exam: Vitals:   09/17/23 0826  BP: 130/78  Pulse: 75  Resp: (!) 21  Temp: 97.9 F (36.6 C)  SpO2: 96%  Weight: 202 lb (91.6 kg)  Height: 6\' 1"  (1.854 m)   Body mass index is 26.65 kg/m. Physical Exam Vitals and nursing note reviewed.  Constitutional:      Appearance: Normal appearance.  HENT:     Head: Normocephalic and atraumatic.     Right Ear: Tympanic membrane normal.     Left Ear: Tympanic membrane normal.     Nose: Nose normal.     Mouth/Throat:     Mouth: Mucous membranes are moist.     Pharynx: Oropharynx  is clear.  Cardiovascular:     Rate and Rhythm: Normal rate and regular rhythm.     Heart sounds: No murmur heard. Pulmonary:     Effort: Pulmonary effort is normal. No respiratory distress.     Breath sounds: Normal breath sounds. No wheezing.  Abdominal:     General: Bowel sounds are normal. There is no distension.     Palpations: Abdomen is soft.     Tenderness: There is no abdominal tenderness.  Musculoskeletal:     Cervical back: Normal range of motion. No rigidity.     Right lower leg: No edema.     Left lower leg: No edema.  Lymphadenopathy:     Cervical: No cervical adenopathy.  Skin:    General: Skin is warm and dry.  Neurological:     General: No focal deficit present.     Mental Status: He is alert and oriented to person, place, and time. Mental status is at baseline.  Psychiatric:        Mood and Affect: Mood normal.     Labs reviewed: Basic Metabolic Panel: Recent Labs    10/05/22 0000 01/30/23 0000 05/15/23 0000 09/11/23 0000  NA 132* 137 133*  --   K 4.8  --  4.3  --   CL 96* 96* 98*  --   CO2 24* 25* 23*  --   BUN 16 13 15   --   CREATININE 1.1 1.1 1.0  --   CALCIUM 10.0 9.9 9.8  --   TSH  --   --    --  2.62   Liver Function Tests: No results for input(s): "AST", "ALT", "ALKPHOS", "BILITOT", "PROT", "ALBUMIN" in the last 8760 hours. No results for input(s): "LIPASE", "AMYLASE" in the last 8760 hours. No results for input(s): "AMMONIA" in the last 8760 hours. CBC: No results for input(s): "WBC", "NEUTROABS", "HGB", "HCT", "MCV", "PLT" in the last 8760 hours. Lipid Panel: Recent Labs    09/11/23 0000  CHOL 127  HDL 40  LDLCALC 59  TRIG 139   Lab Results  Component Value Date   HGBA1C 7.0 09/11/2023    Procedures since last visit: No results found.  Assessment/Plan  Type 2 diabetes mellitus with hyperglycemia A1c increased from 6.6% to 7%, indicating worsening glycemic control. Current Ozempic  0.5 mg effective but causes constipation. Dietary habits may contribute to hyperglycemia. - Continue Ozempic  0.5 mg and metformin  - Encourage reduction in dessert and carbohydrate intake. - Monitor A1c in future visits. - Consider increasing Ozempic  dosage if A1c continues to rise, but wait for a few more visits before making changes.  BMI 26.65 Weight over 200 pounds. Decreased energy and activity levels noted. - Encourage increased physical activity as tolerated. - Reduce dessert and carbohydrate intake.  Hypertension Blood pressure well-controlled at 130/78 mmHg. Current medication regimen effective.  - Send prescription to [facility] at [employer].  Constipation Constipation improved with Ozempic  0.5 mg. Previous treatment with Amitiza  ineffective. - Monitor bowel habits and consider Linzess if constipation worsens.  Obstructive sleep apnea Continues to use CPAP machine with no issues.  Sarcoidosis Chronic cough attributed to sarcoidosis. No new symptoms reported. - Continue current management and follow-up with pulmonology.  Vit D def Currently on supplementation Check level at next visit  Wellness Visit Routine wellness visit for a 78 year old male. No acute  concerns raised.   Labs/tests ordered:  * No order type specified * CBC CMP A1C Vit D urine micro  prior to next apt  Total time :  time greater than 50% of total time spent doing pt counseling and coordination of care

## 2023-10-07 ENCOUNTER — Other Ambulatory Visit: Payer: Self-pay | Admitting: Internal Medicine

## 2023-10-08 NOTE — Telephone Encounter (Signed)
 Pharmacy requested refill Pended Rx and sent to Dr. Chales Abrahams for approval.

## 2023-10-27 ENCOUNTER — Other Ambulatory Visit: Payer: Self-pay | Admitting: Internal Medicine

## 2023-11-05 ENCOUNTER — Telehealth

## 2023-11-05 MED ORDER — VITAMIN D (ERGOCALCIFEROL) 1.25 MG (50000 UNIT) PO CAPS
50000.0000 [IU] | ORAL_CAPSULE | ORAL | 1 refills | Status: DC
  Filled 2024-01-02: qty 12, 84d supply, fill #0

## 2023-11-05 NOTE — Telephone Encounter (Signed)
 Copied from CRM [id number]. Topic: Clinical - Medication Question >> Nov 05, 2023 11:25 AM Shelby Dessert H wrote: Reason for CRM: Patient is calling to see if he has been taken off of Vitamin D  and he is also wanting the clinic to know that normally he take a urine test with his appointments so when the next appointment is made he needs a urine test. Patients callbback is [phone]. If he does answer you can just leave him a message.

## 2023-11-05 NOTE — Telephone Encounter (Signed)
 Spoke with patient and he verbalized understanding of response that was also sent via mychart. Robert Norman stated that he has been off the Vit D for 3 weeks and contacted [facility] and they said they had reached out to us  with no reply.  RX sent for vit D

## 2023-12-11 ENCOUNTER — Ambulatory Visit: Payer: Medicare Other | Admitting: Adult Health

## 2023-12-13 ENCOUNTER — Ambulatory Visit (HOSPITAL_BASED_OUTPATIENT_CLINIC_OR_DEPARTMENT_OTHER): Admitting: Pulmonary Disease

## 2023-12-13 ENCOUNTER — Encounter (HOSPITAL_BASED_OUTPATIENT_CLINIC_OR_DEPARTMENT_OTHER): Payer: Self-pay | Admitting: Pulmonary Disease

## 2023-12-13 VITALS — BP 131/70 | HR 83 | Ht <= 58 in | Wt 201.0 lb

## 2023-12-13 DIAGNOSIS — G4733 Obstructive sleep apnea (adult) (pediatric): Secondary | ICD-10-CM | POA: Diagnosis not present

## 2023-12-13 DIAGNOSIS — D869 Sarcoidosis, unspecified: Secondary | ICD-10-CM | POA: Diagnosis not present

## 2023-12-13 DIAGNOSIS — R053 Chronic cough: Secondary | ICD-10-CM

## 2023-12-13 NOTE — Patient Instructions (Signed)
  VISIT SUMMARY: You came in today for a follow-up on your chronic cough and sleep apnea management. We discussed your consistent use of the CPAP machine and the persistent cough that has been troubling you, especially during allergy  seasons. We also reviewed your history of sarcoidosis and previous treatments.  YOUR PLAN: -CHRONIC COUGH: Chronic cough is a long-lasting cough that can be severe and cause breathing difficulties. We will order a CT scan to rule out the recurrence of sarcoidosis, start you on a nightly nasal spray for postnasal drip, and recommend over-the-counter Sinus PE (chlorpheniramine and phenylephrine) for 3-4 weeks.  -POSTNASAL DRIP: Postnasal drip occurs when excess mucus from the nose drips down the back of the throat, often causing a cough. You should use the nasal spray nightly and take over-the-counter Sinus PE (chlorpheniramine and phenylephrine) for 3-4 weeks.  -SARCOIDOSIS: Sarcoidosis is an inflammatory disease that affects multiple organs, particularly the lungs. We will order a CT scan to check for any recurrence of sarcoidosis due to your chronic cough.  -COUGH VARIANT ASTHMA: Cough variant asthma is a type of asthma where the main symptom is a dry, non-productive cough. We will reassess this condition after your CT scan and postnasal drip treatment.  -OBSTRUCTIVE SLEEP APNEA (OSA): Obstructive sleep apnea is a condition where the airway becomes blocked during sleep, causing breathing pauses. Continue using your CPAP machine at 9 cm H2O and ensure you have a good supply of CPAP masks and accessories.  INSTRUCTIONS: Please schedule a CT scan to rule out the recurrence of sarcoidosis. Use the nasal spray nightly and take Sinus PE (chlorpheniramine and phenylephrine) for 3-4 weeks. Continue using your CPAP machine as directed. Follow up with us  after completing the CT scan and the postnasal drip treatment.                      Contains text  generated by [employer].                                 Contains text generated by [employer].

## 2023-12-13 NOTE — Progress Notes (Signed)
 Subjective:    Patient ID: Robert Norman, male    DOB: [birth year], 78 y.o.   MRN: [id number]    78 yo never smoker for FU of OSA.  He was previously followed with Dr. Oneil Kaska , [facility] [place].   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   OSA was diagnosed in 2002 , he is on his third machine.  09/2020 new CPAP  Discussed the use of AI scribe software for clinical note transcription with the patient, who gave verbal consent to proceed.  History of Present Illness Robert Norman is a 78 year old male with sarcoidosis and obstructive sleep apnea who presents for follow-up of chronic cough and sleep apnea management.  He uses a CPAP machine consistently every night and during naps, with no current issues after resolving previous mask leakage problems. He uses the machine for more than six and a half to seven hours each night, missing only one night in the last three months.  He has a persistent cough that began with his sarcoidosis diagnosis. Initially dry, it has become wet, especially during allergy  seasons, with postnasal drip and phlegm production. Treatments like Allegra, Delsym, and Mucinex have been ineffective, but nasal spray before events helps reduce coughing. His partner notes the cough can be severe, causing breathing difficulties and requiring back pats. The cough has been present since his late thirties and was noted during church services. No heartburn or wheezing reported.  He has sarcoidosis, previously treated with corticosteroids, which resolved lung spots but negatively impacted his diabetes. Recent chest x-rays, including in 2023 and 2024, have been clear. He was previously treated for asthma but was later informed he did not have it, and inhalers were ineffective. He has a history of cough variant asthma, which his partner also  experiences, with symptoms often coinciding with allergy  seasons.   DL - good control of events on 9 cm, excellent compliance , minimal leak   Significant tests/ events reviewed     11/2017-PFT- FVC 4.34 (96 predicted), FEF ratio 79, FEV1 3.43 (104 predicted), DLCO 92, patient did not want to complete postbronchodilator testing.  Nitrogen washout was performed due to patient being unable to perform Pleth   Follow-up PET scan September 02, 2021 showed previously hypermetabolic lymphadenopathy in the neck, chest, abdomen has resolved.   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 Also denies presyncope, palpitations, heartburn, abdominal pain, nausea, vomiting, diarrhea or change in bowel or urinary habits, dysuria,hematuria, rash, arthralgias, visual complaints, headache, numbness weakness or ataxia.      Objective:   Physical Exam  Gen. Pleasant, well-nourished, in no distress, normal affect ENT - no pallor,icterus, no post nasal drip Neck: No JVD, no thyromegaly, no carotid bruits Lungs: no use of accessory muscles, no dullness to percussion, clear without rales or rhonchi  Cardiovascular: Rhythm regular, heart sounds  normal, no murmurs or  gallops, no peripheral edema Abdomen: soft and non-tender, no hepatosplenomegaly, BS normal. Musculoskeletal: No deformities, no cyanosis or clubbing Neuro:  alert, non focal       Assessment & Plan:    Assessment and Plan Assessment & Plan Chronic Cough Chronic cough has persisted for years, characterized by severe episodes leading to dyspnea. The cough is wet, associated with postnasal drip, and unresponsive to Allegra, Delsym, and Mucinex. No wheezing or heartburn is present, and inhalers have been ineffective. - Order CT scan to rule out recurrence of sarcoidosis. - Initiate nasal spray treatment nightly for postnasal drip- flonase - Recommend over-the-counter chlorpheniramine and phenylephrine (Sinus PE) for 3-4 weeks.  Postnasal Drip Postnasal drip is suspected as the primary cause of chronic cough, with symptoms exacerbated during allergy  seasons. Nasal spray provides temporary relief. - Use nasal spray nightly. - Recommend over-the-counter chlorpheniramine and phenylephrine (Sinus PE) for 3-4 weeks.  Sarcoidosis Sarcoidosis with previous pulmonary involvement. Recent x-rays are clear, but CT scan is needed to confirm no recurrence due to chronic cough. Corticosteroids were effective but worsened diabetes. - Order CT scan to assess for sarcoidosis recurrence.  Cough Variant Asthma Cough variant asthma is considered but unconfirmed by previous tests and treatments. Current symptoms do not strongly suggest asthma, and inhalers have been ineffective. - Reassess after CT scan and postnasal drip treatment.  Obstructive Sleep Apnea (OSA) OSA is well-managed with CPAP therapy. He uses the machine consistently with good adherence, and recent mask adjustments resolved leakage issues. - Continue CPAP therapy at 9 cm H2O. - Ensure supply of CPAP masks and accessories is maintained.

## 2023-12-19 NOTE — Progress Notes (Signed)
 SABRA

## 2023-12-20 ENCOUNTER — Other Ambulatory Visit: Payer: Self-pay

## 2023-12-20 ENCOUNTER — Other Ambulatory Visit (HOSPITAL_BASED_OUTPATIENT_CLINIC_OR_DEPARTMENT_OTHER): Payer: Self-pay

## 2023-12-20 MED ORDER — LOSARTAN POTASSIUM 50 MG PO TABS: 25.0000 mg | ORAL_TABLET | Freq: Every day | ORAL | 3 refills | Status: DC

## 2023-12-20 MED ORDER — ONETOUCH DELICA PLUS LANCET33G MISC: 3 refills | Status: AC

## 2023-12-20 MED ORDER — ACCU-CHEK GUIDE W/DEVICE KIT: PACK | 0 refills | Status: AC

## 2023-12-20 MED ORDER — LOSARTAN POTASSIUM 50 MG PO TABS: 25.0000 mg | ORAL_TABLET | Freq: Every day | ORAL | 1 refills | Status: DC

## 2023-12-20 MED ORDER — METFORMIN HCL ER 500 MG PO TB24
1000.0000 mg | ORAL_TABLET | Freq: Two times a day (BID) | ORAL | 3 refills | Status: AC
  Filled 2024-01-02 – 2024-03-27 (×2): qty 360, 90d supply, fill #0

## 2023-12-20 MED ORDER — ACCU-CHEK GUIDE TEST VI STRP: ORAL_STRIP | 11 refills | Status: DC

## 2023-12-20 MED ORDER — OZEMPIC (0.25 OR 0.5 MG/DOSE) 2 MG/3ML ~~LOC~~ SOPN
0.5000 mg | PEN_INJECTOR | SUBCUTANEOUS | 11 refills | Status: DC
  Filled 2024-01-02: qty 3, 28d supply, fill #0
  Filled 2024-01-31: qty 3, 28d supply, fill #1

## 2023-12-20 MED ORDER — LUBIPROSTONE 24 MCG PO CAPS: 24.0000 ug | ORAL_CAPSULE | Freq: Two times a day (BID) | ORAL | 3 refills | Status: DC

## 2023-12-20 MED ORDER — ACCU-CHEK GUIDE W/DEVICE KIT: PACK | 0 refills | Status: DC

## 2023-12-20 MED ORDER — OZEMPIC (1 MG/DOSE) 4 MG/3ML ~~LOC~~ SOPN: 1.0000 mg | PEN_INJECTOR | SUBCUTANEOUS | 1 refills | Status: DC

## 2023-12-23 ENCOUNTER — Ambulatory Visit (HOSPITAL_BASED_OUTPATIENT_CLINIC_OR_DEPARTMENT_OTHER)
Admission: RE | Admit: 2023-12-23 | Discharge: 2023-12-23 | Disposition: A | Discharge status: Home / Self Care | Source: Ambulatory Visit | Attending: Pulmonary Disease | Admitting: Pulmonary Disease

## 2023-12-23 DIAGNOSIS — R161 Splenomegaly, not elsewhere classified: Secondary | ICD-10-CM | POA: Diagnosis not present

## 2023-12-23 DIAGNOSIS — R053 Chronic cough: Secondary | ICD-10-CM | POA: Insufficient documentation

## 2023-12-23 DIAGNOSIS — R918 Other nonspecific abnormal finding of lung field: Secondary | ICD-10-CM | POA: Diagnosis not present

## 2023-12-23 DIAGNOSIS — D869 Sarcoidosis, unspecified: Secondary | ICD-10-CM | POA: Insufficient documentation

## 2023-12-23 LAB — POCT I-STAT CREATININE: Creatinine, Ser: 1.2 mg/dL (ref 0.61–1.24)

## 2023-12-23 MED ORDER — IOHEXOL 300 MG/ML  SOLN
80.0000 mL | Freq: Once | INTRAMUSCULAR | Status: AC | PRN
  Administered 2023-12-23: 80 mL via INTRAVENOUS

## 2024-01-02 ENCOUNTER — Other Ambulatory Visit: Payer: Self-pay

## 2024-01-02 ENCOUNTER — Other Ambulatory Visit (HOSPITAL_BASED_OUTPATIENT_CLINIC_OR_DEPARTMENT_OTHER): Payer: Self-pay

## 2024-01-02 MED FILL — Simvastatin Tab 40 MG: ORAL | 90 days supply | Qty: 90 | Fill #0 | Status: CN

## 2024-01-24 DIAGNOSIS — E1169 Type 2 diabetes mellitus with other specified complication: Secondary | ICD-10-CM | POA: Diagnosis not present

## 2024-01-24 DIAGNOSIS — I10 Essential (primary) hypertension: Secondary | ICD-10-CM | POA: Diagnosis not present

## 2024-01-24 DIAGNOSIS — E559 Vitamin D deficiency, unspecified: Secondary | ICD-10-CM | POA: Diagnosis not present

## 2024-01-24 LAB — BASIC METABOLIC PANEL WITH GFR
BUN: 13 (ref 4–21)
CO2: 24 — AB (ref 13–22)
Chloride: 99 (ref 99–108)
Creatinine: 1.1 (ref 0.6–1.3)
Glucose: 146
Potassium: 4.4 meq/L (ref 3.5–5.1)
Sodium: 134 — AB (ref 137–147)

## 2024-01-24 LAB — LAB REPORT - SCANNED
A1c: 6.7
EGFR: 73

## 2024-01-24 LAB — CBC AND DIFFERENTIAL
HCT: 40 — AB (ref 41–53)
Hemoglobin: 13.8 (ref 13.5–17.5)
Platelets: 195 K/uL (ref 150–400)
WBC: 5.1

## 2024-01-24 LAB — CBC: RBC: 4.61 (ref 3.87–5.11)

## 2024-01-24 LAB — HEMOGLOBIN A1C: Hemoglobin A1C: 6.7

## 2024-01-24 LAB — HEPATIC FUNCTION PANEL
ALT: 32 U/L (ref 10–40)
AST: 25 (ref 14–40)

## 2024-01-24 LAB — COMPREHENSIVE METABOLIC PANEL WITH GFR: Calcium: 9.4 (ref 8.7–10.7)

## 2024-01-25 ENCOUNTER — Other Ambulatory Visit: Payer: Self-pay

## 2024-01-25 DIAGNOSIS — E1169 Type 2 diabetes mellitus with other specified complication: Secondary | ICD-10-CM | POA: Diagnosis not present

## 2024-01-25 DIAGNOSIS — R309 Painful micturition, unspecified: Secondary | ICD-10-CM | POA: Diagnosis not present

## 2024-01-25 DIAGNOSIS — I10 Essential (primary) hypertension: Secondary | ICD-10-CM | POA: Diagnosis not present

## 2024-01-25 DIAGNOSIS — R8290 Unspecified abnormal findings in urine: Secondary | ICD-10-CM | POA: Diagnosis not present

## 2024-01-25 DIAGNOSIS — E559 Vitamin D deficiency, unspecified: Secondary | ICD-10-CM | POA: Diagnosis not present

## 2024-01-25 LAB — LAB REPORT - SCANNED: Microalbumin, Urine: 1.2

## 2024-02-01 ENCOUNTER — Encounter: Payer: Self-pay | Admitting: Adult Health

## 2024-02-05 ENCOUNTER — Non-Acute Institutional Stay: Admitting: Internal Medicine

## 2024-02-05 ENCOUNTER — Encounter: Payer: Self-pay | Admitting: Internal Medicine

## 2024-02-05 VITALS — BP 122/78 | HR 86 | Temp 98.1°F | Ht 71.0 in | Wt 198.6 lb

## 2024-02-05 DIAGNOSIS — G4730 Sleep apnea, unspecified: Secondary | ICD-10-CM | POA: Diagnosis not present

## 2024-02-05 DIAGNOSIS — E782 Mixed hyperlipidemia: Secondary | ICD-10-CM | POA: Diagnosis not present

## 2024-02-05 DIAGNOSIS — E1169 Type 2 diabetes mellitus with other specified complication: Secondary | ICD-10-CM

## 2024-02-05 DIAGNOSIS — K5909 Other constipation: Secondary | ICD-10-CM

## 2024-02-05 DIAGNOSIS — E559 Vitamin D deficiency, unspecified: Secondary | ICD-10-CM

## 2024-02-05 DIAGNOSIS — I10 Essential (primary) hypertension: Secondary | ICD-10-CM

## 2024-02-05 NOTE — Progress Notes (Unsigned)
 Location:  [facility] features editor of Service:  Clinic (12)  Provider:   Code Status: *** Goals of Care:     06/12/2023    1:45 PM  Advanced Directives  Does Patient Have a Medical Advance Directive? Yes  Type of Estate agent of [place];Living will  Copy of Healthcare Power of Attorney in Chart? Yes - validated most recent copy scanned in chart (See row information)     Chief Complaint  Patient presents with   Medical Management of Chronic Issues    4 month f/u. Discuss need for foot exam, diabetic kidney evaluation. Postponed flu vaccine(will get at his pharmacy)    HPI: Patient is a 78 y.o. male seen today for medical management of chronic diseases.     Past Medical History:  Diagnosis Date   Asthma    Calcific Achilles tendonitis    bilateral   Controlled type 2 diabetes mellitus without complication (HCC)    DDD (degenerative disc disease), cervical    Gallstone 09/11/2011   GERD (gastroesophageal reflux disease)    Hyperlipidemia    mixed   Insomnia    Mild cognitive impairment    Overweight (BMI 25.0-29.9)    Prostate cancer (HCC)    Pulmonary nodule/lesion, solitary 06/2004   f/u in 2014, 2015 stable in [facility]   Sleep apnea    Slow transit constipation    Vitamin D  deficiency     Past Surgical History:  Procedure Laterality Date   CERVICAL FUSION  1999 & 2004   implant metal plate   CHOLECYSTECTOMY  2012   COLONOSCOPY  12/2015   EYE SURGERY  1997   laser vision correction   palatouvulopasty  1995   PROSTATE SURGERY  09/11/2011   prostatectomy for prostate cancer    TONSILLECTOMY     WISDOM TOOTH EXTRACTION      Allergies  Allergen Reactions   Tetracyclines & Related Other (See Comments)    hepatitis   Niacin And Related Other (See Comments)    headache   Wellbutrin [Bupropion] Other (See Comments)    dizziness    Outpatient Encounter Medications as of 02/05/2024  Medication Sig   b complex vitamins  capsule Take 1 capsule by mouth daily.   Coenzyme Q10 (CO Q 10) 100 MG CAPS Take 2 capsules by mouth daily.   fluocinonide  (LIDEX ) 0.05 % external solution APPLY ONE APPLICATION TOPICALLY TWICE A DAY AS NEEDED   losartan  (COZAAR ) 25 MG tablet Take 1 tablet (25 mg total) by mouth daily.   metFORMIN  (GLUCOPHAGE -XR) 500 MG 24 hr tablet Take 2 tablets (1,000 mg total) by mouth in the morning and 2 tablets in the evening. Take with meals.   polyethylene glycol (MIRALAX  / GLYCOLAX ) packet Take 17 g by mouth 3 (three) times a week.   Semaglutide ,0.25 or 0.5MG /DOS, (OZEMPIC , 0.25 OR 0.5 MG/DOSE,) 2 MG/3ML SOPN Inject 0.5 mg into the skin once a week.   senna (SENOKOT) 8.6 MG TABS tablet Take 3 tablets by mouth at bedtime.   simvastatin  (ZOCOR ) 40 MG tablet Take 1 tablet (40 mg total) by mouth every evening.   tadalafil  (CIALIS ) 20 MG tablet Take 1 tablet (20 mg total) by mouth daily as needed for erectile dysfunction.   UNABLE TO FIND Take 2 tablets by mouth daily at 6 (six) AM. Med Name: Cold and allergy    Vitamin D , Ergocalciferol , (DRISDOL ) 1.25 MG (50000 UNIT) CAPS capsule Take 1 capsule (50,000 Units total) by mouth every  week.   [DISCONTINUED] Semaglutide ,0.25 or 0.5MG /DOS, 2 MG/3ML SOPN Inject 0.5 mg into the skin once a week.   Blood Glucose Monitoring Suppl (ACCU-CHEK GUIDE) w/Device KIT Use as directed to check blood sugar 4 times daily. (Patient not taking: Reported on 02/05/2024)   Blood Glucose Monitoring Suppl (ACCU-CHEK GUIDE) w/Device KIT Use as directed to check blood sugar 4 times daily. (Patient not taking: Reported on 02/05/2024)   glucose blood (ACCU-CHEK GUIDE TEST) test strip Use as directed to check blood sugar four times daily. (Patient not taking: Reported on 02/05/2024)   Lancets (ONETOUCH DELICA PLUS LANCET33G) MISC Use to check blood sugar up to 4 times daily. (Patient not taking: Reported on 02/05/2024)   [DISCONTINUED] Fexofenadine HCl (MUCINEX ALLERGY  PO) Take by mouth.    [DISCONTINUED] losartan  (COZAAR ) 50 MG tablet Take 0.5 tablets (25 mg total) by mouth daily.   [DISCONTINUED] lubiprostone  (AMITIZA ) 24 MCG capsule Take 1 capsule (24 mcg total) by mouth 2 (two) times daily with a meal. (Patient not taking: Reported on 02/05/2024)   [DISCONTINUED] metFORMIN  (GLUCOPHAGE ) 1000 MG tablet Take 1 tablet (1,000 mg total) by mouth 2 (two) times daily with a meal.   [DISCONTINUED] Semaglutide , 1 MG/DOSE, (OZEMPIC , 1 MG/DOSE,) 4 MG/3ML SOPN Inject 1 mg into the skin once a week.   [DISCONTINUED] sennosides-docusate sodium  (SENOKOT-S) 8.6-50 MG tablet Take 2 tablets by mouth 2 (two) times daily. (Patient taking differently: Take 2 tablets by mouth daily.)   [DISCONTINUED] Turmeric 500 MG CAPS Take 500 mg by mouth. Twice a week   No facility-administered encounter medications on file as of 02/05/2024.    Review of Systems:  Review of Systems  Health Maintenance  Topic Date Due   Diabetic kidney evaluation - Urine ACR  09/20/2023   FOOT EXAM  11/20/2023   Influenza Vaccine  04/25/2024 (Originally 12/28/2023)   Medicare Annual Wellness (AWV)  04/15/2024   OPHTHALMOLOGY EXAM  07/03/2024   HEMOGLOBIN A1C  07/26/2024   Diabetic kidney evaluation - eGFR measurement  01/23/2025   DTaP/Tdap/Td (3 - Td or Tdap) 05/07/2027   Pneumococcal Vaccine: 50+ Years  Completed   Hepatitis C Screening  Completed   Zoster Vaccines- Shingrix  Completed   HPV VACCINES  Aged Out   Meningococcal B Vaccine  Aged Out   Colonoscopy  Discontinued   COVID-19 Vaccine  Discontinued    Physical Exam: Vitals:   02/05/24 0928  BP: 122/78  Pulse: 86  Temp: 98.1 F (36.7 C)  SpO2: 97%  Weight: 198 lb 9.6 oz (90.1 kg)  Height: 5' 11 (1.803 m)   Body mass index is 27.7 kg/m. Physical Exam  Labs reviewed: Basic Metabolic Panel: Recent Labs    05/15/23 0000 09/11/23 0000 12/23/23 1620 01/24/24 1002  NA 133*  --   --  134*  K 4.3  --   --  4.4  CL 98*  --   --  99  CO2 23*  --   --   24*  BUN 15  --   --  13  CREATININE 1.0  --  1.20 1.1  CALCIUM 9.8  --   --  9.4  TSH  --  2.62  --   --    Liver Function Tests: Recent Labs    01/24/24 1002  AST 25  ALT 32   No results for input(s): LIPASE, AMYLASE in the last 8760 hours. No results for input(s): AMMONIA in the last 8760 hours. CBC: Recent Labs    01/24/24  1002  WBC 5.1  HGB 13.8  HCT 40*  PLT 195   Lipid Panel: Recent Labs    09/11/23 0000  CHOL 127  HDL 40  LDLCALC 59  TRIG 139   Lab Results  Component Value Date   HGBA1C 6.7 01/24/2024    Procedures since last visit: No results found.  Assessment/Plan There are no diagnoses linked to this encounter.   Labs/tests ordered:  * No order type specified * Next appt:  Visit date not found

## 2024-02-05 NOTE — Patient Instructions (Signed)
 1.) Please visit your local pharmacy to receive your flu vaccine.

## 2024-02-13 ENCOUNTER — Encounter (HOSPITAL_BASED_OUTPATIENT_CLINIC_OR_DEPARTMENT_OTHER): Payer: Self-pay

## 2024-02-13 ENCOUNTER — Ambulatory Visit (HOSPITAL_BASED_OUTPATIENT_CLINIC_OR_DEPARTMENT_OTHER)

## 2024-02-13 VITALS — BP 120/75 | HR 112 | Ht 71.0 in | Wt 197.0 lb

## 2024-02-13 DIAGNOSIS — R053 Chronic cough: Secondary | ICD-10-CM

## 2024-02-13 DIAGNOSIS — G4733 Obstructive sleep apnea (adult) (pediatric): Secondary | ICD-10-CM

## 2024-02-13 DIAGNOSIS — D869 Sarcoidosis, unspecified: Secondary | ICD-10-CM | POA: Diagnosis not present

## 2024-02-13 NOTE — Assessment & Plan Note (Signed)
-   stable per CT

## 2024-02-13 NOTE — Progress Notes (Signed)
 @Patient  ID: Robert Norman, male    DOB: 03/27/1946, 78 y.o.   MRN: [id number]  Chief Complaint  Patient presents with   Follow-up    Referring provider: Charlanne Fredia CROME, MD  HPI: Robert Norman is a 78 y/o male with PMH of sarcoidosis, OSA, multiple pulmonary nodules, and GERD who presents today for follow up of chronic cough.  He reports that he has been taking Sinus PE since his last visit in addition to a nasal spray that he got from [employer].  He feels that this has been greatly helpful to his cough.  He states that he has tried inhalers in the past as well as Delsym, Allegra, and Mucinex- all without benefit.  He has also taken Pepcid without relief.  He reports the cough to be wet at baseline. He did complete his follow up chest ct; results were reviewed with him today- full report below.  TEST/EVENTS : Chest CT 12/23/2023:  IMPRESSION: 1. Mild diffuse bilateral bronchial wall thickening consistent with nonspecific infectious or inflammatory bronchitis. 2. Multiple bilateral pulmonary nodules are unchanged and definitively benign, largest in the left lower lobe measuring 1.1 x 1.0 cm. 3. Hepatic steatosis. 4. Splenomegaly. 5. Coronary artery disease.  Allergies  Allergen Reactions   Tetracyclines & Related Other (See Comments)    hepatitis   Niacin And Related Other (See Comments)    headache   Wellbutrin [Bupropion] Other (See Comments)    dizziness    Immunization History  Administered Date(s) Administered   Fluad Quad(high Dose 65+) 03/20/2023   Fluzone Influenza virus vaccine,trivalent (IIV3), split virus 01/19/2010, 03/29/2011, 02/14/2012, 03/05/2013   INFLUENZA, HIGH DOSE SEASONAL PF 03/04/2018, 03/07/2019, 03/04/2020, 04/13/2020, 03/23/2021   Influenza-Unspecified 03/12/2014, 03/02/2016, 02/26/2017, 03/21/2022   Moderna Sars-Covid-2 Vaccination 04/13/2020   PFIZER(Purple Top)SARS-COV-2 Vaccination 05/31/2019, 06/18/2019   Pneumococcal Conjugate-13  08/04/2016   Pneumococcal Polysaccharide-23 04/05/2010, 04/17/2019   Td 05/06/2017   Tdap 03/12/2014   Zoster Recombinant(Shingrix) 01/31/2017, 02/26/2017, 03/29/2017   Zoster, Live 07/19/2007    Past Medical History:  Diagnosis Date   Asthma    Calcific Achilles tendonitis    bilateral   Controlled type 2 diabetes mellitus without complication (HCC)    DDD (degenerative disc disease), cervical    Gallstone 09/11/2011   GERD (gastroesophageal reflux disease)    Hyperlipidemia    mixed   Insomnia    Mild cognitive impairment    Overweight (BMI 25.0-29.9)    Prostate cancer (HCC)    Pulmonary nodule/lesion, solitary 06/2004   f/u in 2014, 2015 stable in [facility]   Sleep apnea    Slow transit constipation    Vitamin D  deficiency     Tobacco History: Social History   Tobacco Use  Smoking Status Never  Smokeless Tobacco Never   Counseling given: Not Answered   Outpatient Medications Prior to Visit  Medication Sig Dispense Refill   b complex vitamins capsule Take 1 capsule by mouth daily.     Blood Glucose Monitoring Suppl (ACCU-CHEK GUIDE) w/Device KIT Use as directed to check blood sugar 4 times daily. 1 kit 0   Blood Glucose Monitoring Suppl (ACCU-CHEK GUIDE) w/Device KIT Use as directed to check blood sugar 4 times daily. 1 kit 0   Coenzyme Q10 (CO Q 10) 100 MG CAPS Take 2 capsules by mouth daily.     fluocinonide  (LIDEX ) 0.05 % external solution APPLY ONE APPLICATION TOPICALLY TWICE A DAY AS NEEDED 60 mL 0   Lancets (ONETOUCH DELICA PLUS LANCET33G)  MISC Use to check blood sugar up to 4 times daily. 400 each 3   losartan  (COZAAR ) 25 MG tablet Take 1 tablet (25 mg total) by mouth daily. 90 tablet 2   metFORMIN  (GLUCOPHAGE -XR) 500 MG 24 hr tablet Take 2 tablets (1,000 mg total) by mouth in the morning and 2 tablets in the evening. Take with meals. 360 tablet 3   polyethylene glycol (MIRALAX  / GLYCOLAX ) packet Take 17 g by mouth 3 (three) times a week.     Semaglutide ,0.25 or  0.5MG /DOS, (OZEMPIC , 0.25 OR 0.5 MG/DOSE,) 2 MG/3ML SOPN Inject 0.5 mg into the skin once a week. 3 mL 11   senna (SENOKOT) 8.6 MG TABS tablet Take 3 tablets by mouth at bedtime.     simvastatin  (ZOCOR ) 40 MG tablet Take 1 tablet (40 mg total) by mouth every evening. 90 tablet 3   tadalafil  (CIALIS ) 20 MG tablet Take 1 tablet (20 mg total) by mouth daily as needed for erectile dysfunction. 30 tablet 3   UNABLE TO FIND Take 2 tablets by mouth daily at 6 (six) AM. Med Name: Cold and allergy      Vitamin D , Ergocalciferol , (DRISDOL ) 1.25 MG (50000 UNIT) CAPS capsule Take 1 capsule (50,000 Units total) by mouth every week. 12 capsule 1   No facility-administered medications prior to visit.     Review of Systems: as per hpi  Constitutional:   No  weight loss, night sweats,  Fevers, chills, fatigue, or  lassitude.  HEENT:   No headaches,  Difficulty swallowing,  Tooth/dental problems, or  Sore throat,                No sneezing, itching, ear ache, nasal congestion, post nasal drip,   CV:  No chest pain,  Orthopnea, PND, swelling in lower extremities, anasarca, dizziness, palpitations, syncope.   GI  No heartburn, indigestion, abdominal pain, nausea, vomiting, diarrhea, change in bowel habits, loss of appetite, bloody stools.   Resp: No shortness of breath with exertion or at rest.  No excess mucus, no productive cough,  No non-productive cough,  No coughing up of blood.  No change in color of mucus.  No wheezing.  No chest wall deformity  Skin: no rash or lesions.  GU: no dysuria, change in color of urine, no urgency or frequency.  No flank pain, no hematuria   MS:  No joint pain or swelling.  No decreased range of motion.  No back pain.    Physical Exam  BP 120/75   Pulse (!) 112   Ht 5' 11 (1.803 m)   Wt 197 lb (89.4 kg)   SpO2 96%   BMI 27.48 kg/m   GEN: A/Ox3; pleasant , NAD, well nourished    HEENT:  [place]/AT,  EACs-clear, TMs-wnl, NOSE-clear, THROAT-clear, no lesions, no  postnasal drip or exudate noted.   NECK:  Supple w/ fair ROM; no JVD; normal carotid impulses w/o bruits; no thyromegaly or nodules palpated; no lymphadenopathy.    RESP  Clear  P & A; w/o, wheezes/ rales/ or rhonchi. no accessory muscle use, no dullness to percussion  CARD:  RRR, no m/r/g, no peripheral edema, pulses intact, no cyanosis or clubbing.  GI:   Soft & nt; nml bowel sounds; no organomegaly or masses detected.   Musco: Warm bil, no deformities or joint swelling noted.   Neuro: alert, no focal deficits noted.    Skin: Warm, no lesions or rashes    Lab Results:  CBC  Component Value Date/Time   WBC 5.1 01/24/2024 1002   WBC 5.9 11/20/2017 1247   RBC 4.61 01/24/2024 1002   HGB 13.8 01/24/2024 1002   HCT 40 (A) 01/24/2024 1002   PLT 195 01/24/2024 1002   MCV 88.0 11/20/2017 1247   MCH 29.5 11/20/2017 1247   MCHC 33.5 11/20/2017 1247   RDW 12.4 11/20/2017 1247   LYMPHSABS 1.0 10/08/2017 1640   MONOABS 0.6 10/08/2017 1640   EOSABS 0.1 10/08/2017 1640   BASOSABS 0.0 10/08/2017 1640    BMET    Component Value Date/Time   NA 134 (A) 01/24/2024 1002   K 4.4 01/24/2024 1002   CL 99 01/24/2024 1002   CO2 24 (A) 01/24/2024 1002   GLUCOSE 122 (H) 09/13/2017 0434   BUN 13 01/24/2024 1002   CREATININE 1.1 01/24/2024 1002   CREATININE 1.20 12/23/2023 1620   CALCIUM 9.4 01/24/2024 1002   GFRNONAA 71.19 08/07/2019 0800   GFRAA 82.51 08/07/2019 0800    BNP No results found for: BNP  ProBNP No results found for: PROBNP  Imaging: No results found.  Administration History     None          Latest Ref Rng & Units 12/25/2017    3:04 PM  PFT Results  FVC-Pre L 4.34   FVC-Predicted Pre % 96   Pre FEV1/FVC % % 79   FEV1-Pre L 3.43   FEV1-Predicted Pre % 104   DLCO uncorrected ml/min/mmHg 31.15   DLCO UNC% % 92   DLVA Predicted % 95     Lab Results  Component Value Date   NITRICOXIDE 16 10/08/2017     Assessment & Plan:   Assessment &  Plan Chronic cough Chronic cough has persisted for years, characterized by severe episodes leading to dyspnea. The cough is wet, associated with postnasal drip, and unresponsive to Allegra, Delsym, and Mucinex. No wheezing or heartburn is present, and inhalers have been ineffective. - CT scan shows mild airway inflammation consistent with bronchitis, but no lymphadenopathy - Continue Flonase -  Wean down frequency of Sinus PE to be used prn Sarcoidosis -  stable per CT OSA on CPAP -  well controlled -  continue current settings    Return in about 6 months (around 08/12/2024).  Candis Dandy, PA-C 02/13/2024

## 2024-02-13 NOTE — Patient Instructions (Addendum)
 Wean over-the-counter chlorpheniramine and phenylephrine (Sinus PE) to once daily for one week, then every other day for a week, then stop and use as needed only.  Continue OTC nasal spray.  Continue CPAP on current settings.    Return to clinic in 6 months for follow up with Dr. Alva; sooner if new or worsening symptoms.

## 2024-02-13 NOTE — Assessment & Plan Note (Signed)
-    well controlled -  continue current settings

## 2024-02-21 ENCOUNTER — Other Ambulatory Visit (HOSPITAL_BASED_OUTPATIENT_CLINIC_OR_DEPARTMENT_OTHER): Payer: Self-pay

## 2024-02-21 DIAGNOSIS — E1165 Type 2 diabetes mellitus with hyperglycemia: Secondary | ICD-10-CM | POA: Diagnosis not present

## 2024-02-21 MED ORDER — METFORMIN HCL ER 500 MG PO TB24
1000.0000 mg | ORAL_TABLET | Freq: Two times a day (BID) | ORAL | 3 refills | Status: DC
  Filled 2024-02-21: qty 360, 90d supply, fill #0

## 2024-02-21 MED ORDER — MOUNJARO 2.5 MG/0.5ML ~~LOC~~ SOAJ
SUBCUTANEOUS | 0 refills | Status: DC
  Filled 2024-02-21: qty 2, 28d supply, fill #0

## 2024-02-21 MED ORDER — MOUNJARO 5 MG/0.5ML ~~LOC~~ SOAJ
5.0000 mg | SUBCUTANEOUS | 11 refills | Status: AC
  Filled 2024-03-27: qty 2, 28d supply, fill #0
  Filled 2024-04-28: qty 2, 28d supply, fill #1
  Filled 2024-06-19: qty 2, 28d supply, fill #2

## 2024-02-24 ENCOUNTER — Other Ambulatory Visit (HOSPITAL_BASED_OUTPATIENT_CLINIC_OR_DEPARTMENT_OTHER): Payer: Self-pay

## 2024-02-24 MED FILL — Simvastatin Tab 40 MG: ORAL | 90 days supply | Qty: 90 | Fill #0 | Status: AC

## 2024-02-25 ENCOUNTER — Other Ambulatory Visit (HOSPITAL_BASED_OUTPATIENT_CLINIC_OR_DEPARTMENT_OTHER): Payer: Self-pay

## 2024-02-26 ENCOUNTER — Other Ambulatory Visit (HOSPITAL_BASED_OUTPATIENT_CLINIC_OR_DEPARTMENT_OTHER): Payer: Self-pay

## 2024-03-27 ENCOUNTER — Other Ambulatory Visit (HOSPITAL_BASED_OUTPATIENT_CLINIC_OR_DEPARTMENT_OTHER): Payer: Self-pay

## 2024-03-27 DIAGNOSIS — Z23 Encounter for immunization: Secondary | ICD-10-CM | POA: Diagnosis not present

## 2024-03-27 MED ORDER — FLUZONE HIGH-DOSE 0.5 ML IM SUSY
0.5000 mL | PREFILLED_SYRINGE | Freq: Once | INTRAMUSCULAR | 0 refills | Status: AC
  Filled 2024-03-27: qty 0.5, 1d supply, fill #0

## 2024-04-28 ENCOUNTER — Other Ambulatory Visit (HOSPITAL_BASED_OUTPATIENT_CLINIC_OR_DEPARTMENT_OTHER): Payer: Self-pay

## 2024-04-28 ENCOUNTER — Other Ambulatory Visit: Payer: Self-pay | Admitting: Internal Medicine

## 2024-04-28 NOTE — Telephone Encounter (Signed)
 High risk warning

## 2024-04-29 ENCOUNTER — Other Ambulatory Visit (HOSPITAL_BASED_OUTPATIENT_CLINIC_OR_DEPARTMENT_OTHER): Payer: Self-pay

## 2024-04-29 DIAGNOSIS — R7989 Other specified abnormal findings of blood chemistry: Secondary | ICD-10-CM | POA: Diagnosis not present

## 2024-04-29 DIAGNOSIS — E559 Vitamin D deficiency, unspecified: Secondary | ICD-10-CM | POA: Diagnosis not present

## 2024-04-29 DIAGNOSIS — R7309 Other abnormal glucose: Secondary | ICD-10-CM | POA: Diagnosis not present

## 2024-04-29 LAB — BASIC METABOLIC PANEL WITH GFR
BUN: 13 (ref 4–21)
CO2: 27 — AB (ref 13–22)
Chloride: 100 (ref 99–108)
Creatinine: 1.1 (ref 0.6–1.3)
Glucose: 156
Potassium: 4.3 meq/L (ref 3.5–5.1)
Sodium: 136 — AB (ref 137–147)

## 2024-04-29 LAB — HEMOGLOBIN A1C: Hemoglobin A1C: 7.2

## 2024-04-29 LAB — COMPREHENSIVE METABOLIC PANEL WITH GFR
Calcium: 9.7 (ref 8.7–10.7)
eGFR: 71

## 2024-04-29 LAB — VITAMIN D 25 HYDROXY (VIT D DEFICIENCY, FRACTURES): Vit D, 25-Hydroxy: 61.4

## 2024-05-05 ENCOUNTER — Encounter: Payer: Self-pay | Admitting: Adult Health

## 2024-05-05 ENCOUNTER — Other Ambulatory Visit (HOSPITAL_BASED_OUTPATIENT_CLINIC_OR_DEPARTMENT_OTHER): Payer: Self-pay

## 2024-05-05 ENCOUNTER — Non-Acute Institutional Stay: Payer: Self-pay | Admitting: Adult Health

## 2024-05-05 VITALS — BP 136/78 | HR 77 | Temp 97.8°F | Ht 71.0 in | Wt 200.0 lb

## 2024-05-05 DIAGNOSIS — Z7984 Long term (current) use of oral hypoglycemic drugs: Secondary | ICD-10-CM

## 2024-05-05 DIAGNOSIS — E782 Mixed hyperlipidemia: Secondary | ICD-10-CM

## 2024-05-05 DIAGNOSIS — Z7985 Long-term (current) use of injectable non-insulin antidiabetic drugs: Secondary | ICD-10-CM | POA: Diagnosis not present

## 2024-05-05 DIAGNOSIS — I10 Essential (primary) hypertension: Secondary | ICD-10-CM | POA: Diagnosis not present

## 2024-05-05 DIAGNOSIS — E559 Vitamin D deficiency, unspecified: Secondary | ICD-10-CM

## 2024-05-05 DIAGNOSIS — E118 Type 2 diabetes mellitus with unspecified complications: Secondary | ICD-10-CM

## 2024-05-05 DIAGNOSIS — K5901 Slow transit constipation: Secondary | ICD-10-CM

## 2024-05-05 MED ORDER — VITAMIN D3 50 MCG (2000 UT) PO CAPS: 2000.0000 [IU] | ORAL_CAPSULE | Freq: Every day | ORAL | 5 refills | Status: DC

## 2024-05-05 MED ORDER — FLUOCINONIDE 0.05 % EX SOLN: Freq: Two times a day (BID) | CUTANEOUS | 0 refills | Status: AC | PRN

## 2024-05-05 MED ORDER — VITAMIN D3 50 MCG (2000 UT) PO CAPS
2000.0000 [IU] | ORAL_CAPSULE | Freq: Every day | ORAL | 5 refills | Status: AC
  Filled 2024-05-05: qty 30, 30d supply, fill #0

## 2024-05-05 NOTE — Progress Notes (Signed)
 Location:  [facility]  POS: Clinic  Provider: Tawni America, ANP   Goals of Care:     06/12/2023    1:45 PM  Advanced Directives  Does Patient Have a Medical Advance Directive? Yes  Type of Estate Agent of [place];Living will  Copy of Healthcare Power of Attorney in Chart? Yes - validated most recent copy scanned in chart (See row information)     Chief Complaint  Patient presents with   Follow-up    3 month follow up    HPI:  History of Present Illness Robert Norman is a 78 year old male with diabetes who presents for a follow-up regarding his diabetes management.  Glycemic control and antidiabetic medication - Diabetes managed with Mounjaro , previously on Ozempic . - Ozempic  caused significant constipation and marked drop in energy and physical activity. - Switched to Mounjaro  in September; initial dose 0.25 mg, currently on 5 mg. - On Mounjaro ,  A1c rose from 6.4% (September) to 7.2% (December). - Constipation improved on Mounjaro  but requires Senokot and Miralax .   Hypertension management - Takes losartan  for blood pressure control. - Home blood pressure monitoring shows occasional elevated diastolic readings and systolic readings up to 156 mmHg. - Attributes variability in readings to physical activity and clothing.  Hyperlipidemia management - Takes Zocor  for cholesterol management.  Vitamin d  supplementation - Daily vitamin D  supplement dose recently reduced after vitamin D  level of 61 ng/mL.     Past Medical History:  Diagnosis Date   Asthma    Calcific Achilles tendonitis    bilateral   Controlled type 2 diabetes mellitus without complication (HCC)    DDD (degenerative disc disease), cervical    Gallstone 09/11/2011   GERD (gastroesophageal reflux disease)    Hyperlipidemia    mixed   Insomnia    Mild cognitive impairment    Overweight (BMI 25.0-29.9)    Prostate cancer (HCC)    Pulmonary nodule/lesion,  solitary 06/2004   f/u in 2014, 2015 stable in [facility]   Sleep apnea    Slow transit constipation    Vitamin D  deficiency     Past Surgical History:  Procedure Laterality Date   CERVICAL FUSION  1999 & 2004   implant metal plate   CHOLECYSTECTOMY  2012   COLONOSCOPY  12/2015   EYE SURGERY  1997   laser vision correction   palatouvulopasty  1995   PROSTATE SURGERY  09/11/2011   prostatectomy for prostate cancer    TONSILLECTOMY     WISDOM TOOTH EXTRACTION      Allergies  Allergen Reactions   Tetracyclines & Related Other (See Comments)    hepatitis   Niacin And Related Other (See Comments)    headache   Wellbutrin [Bupropion] Other (See Comments)    dizziness    Outpatient Encounter Medications as of 05/05/2024  Medication Sig   Vitamin D , Ergocalciferol , (DRISDOL ) 1.25 MG (50000 UNIT) CAPS capsule TAKE ONE CAPSULE BY MOUTH EVERY WEEK   b complex vitamins capsule Take 1 capsule by mouth daily.   Blood Glucose Monitoring Suppl (ACCU-CHEK GUIDE) w/Device KIT Use as directed to check blood sugar 4 times daily.   Blood Glucose Monitoring Suppl (ACCU-CHEK GUIDE) w/Device KIT Use as directed to check blood sugar 4 times daily.   Coenzyme Q10 (CO Q 10) 100 MG CAPS Take 2 capsules by mouth daily.   fluocinonide  (LIDEX ) 0.05 % external solution APPLY ONE APPLICATION TOPICALLY TWICE A DAY AS NEEDED   Lancets (ONETOUCH  DELICA PLUS LANCET33G) MISC Use to check blood sugar up to 4 times daily.   losartan  (COZAAR ) 25 MG tablet Take 1 tablet (25 mg total) by mouth daily.   metFORMIN  (GLUCOPHAGE -XR) 500 MG 24 hr tablet Take 2 tablets (1,000 mg total) by mouth in the morning and 2 tablets in the evening. Take with meals.   metFORMIN  (GLUCOPHAGE -XR) 500 MG 24 hr tablet Take 2 tablets (1,000 mg total) by mouth 2 (two) times daily with meals.   polyethylene glycol (MIRALAX  / GLYCOLAX ) packet Take 17 g by mouth 3 (three) times a week.   Semaglutide ,0.25 or 0.5MG /DOS, (OZEMPIC , 0.25 OR 0.5 MG/DOSE,)  2 MG/3ML SOPN Inject 0.5 mg into the skin once a week.   senna (SENOKOT) 8.6 MG TABS tablet Take 3 tablets by mouth at bedtime.   simvastatin  (ZOCOR ) 40 MG tablet Take 1 tablet (40 mg total) by mouth every evening.   tadalafil  (CIALIS ) 20 MG tablet Take 1 tablet (20 mg total) by mouth daily as needed for erectile dysfunction.   tirzepatide  (MOUNJARO ) 2.5 MG/0.5ML Pen Inject into the skin once a week.   tirzepatide  (MOUNJARO ) 5 MG/0.5ML Pen Inject 5 mg into the skin once a week.   UNABLE TO FIND Take 2 tablets by mouth daily at 6 (six) AM. Med Name: Cold and allergy    Vitamin D , Ergocalciferol , (DRISDOL ) 1.25 MG (50000 UNIT) CAPS capsule Take 1 capsule (50,000 Units total) by mouth every week.   No facility-administered encounter medications on file as of 05/05/2024.    Review of Systems:  Review of Systems  Constitutional:  Negative for activity change, appetite change, chills, diaphoresis, fatigue and fever.  HENT:  Negative for congestion.   Respiratory:  Negative for cough, shortness of breath and wheezing.   Cardiovascular:  Negative for chest pain and leg swelling.  Gastrointestinal:  Positive for constipation. Negative for abdominal distention, abdominal pain, diarrhea, nausea and vomiting.  Genitourinary:  Negative for difficulty urinating, dysuria and urgency.  Musculoskeletal:  Negative for gait problem, myalgias and neck pain.  Skin:  Negative for rash.  Neurological:  Negative for dizziness and weakness.  Psychiatric/Behavioral:  Negative for confusion.     Health Maintenance  Topic Date Due   Diabetic kidney evaluation - Urine ACR  09/20/2023   FOOT EXAM  11/20/2023   Medicare Annual Wellness (AWV)  04/15/2024   OPHTHALMOLOGY EXAM  07/03/2024   HEMOGLOBIN A1C  10/28/2024   Diabetic kidney evaluation - eGFR measurement  04/29/2025   DTaP/Tdap/Td (3 - Td or Tdap) 05/07/2027   Pneumococcal Vaccine: 50+ Years  Completed   Influenza Vaccine  Completed   Hepatitis C  Screening  Completed   Zoster Vaccines- Shingrix  Completed   Meningococcal B Vaccine  Aged Out   Colonoscopy  Discontinued   COVID-19 Vaccine  Discontinued    Physical Exam: Vitals:   05/05/24 0848  BP: 136/78  Pulse: 77  Temp: 97.8 F (36.6 C)  SpO2: 97%  Weight: 200 lb (90.7 kg)  Height: 5' 11 (1.803 m)   Body mass index is 27.89 kg/m. Wt Readings from Last 3 Encounters:  05/05/24 200 lb (90.7 kg)  02/13/24 197 lb (89.4 kg)  02/05/24 198 lb 9.6 oz (90.1 kg)    Physical Exam Vitals and nursing note reviewed.  Constitutional:      Appearance: Normal appearance.  HENT:     Head: Normocephalic and atraumatic.     Right Ear: Tympanic membrane normal.     Left Ear: Tympanic membrane  normal.     Nose: Nose normal.     Mouth/Throat:     Mouth: Mucous membranes are moist.     Pharynx: Oropharynx is clear.  Eyes:     Conjunctiva/sclera: Conjunctivae normal.     Pupils: Pupils are equal, round, and reactive to light.  Cardiovascular:     Rate and Rhythm: Normal rate and regular rhythm.     Pulses:          Dorsalis pedis pulses are 2+ on the right side and 2+ on the left side.     Heart sounds: No murmur heard. Pulmonary:     Effort: Pulmonary effort is normal. No respiratory distress.     Breath sounds: Normal breath sounds. No wheezing.  Abdominal:     General: Bowel sounds are normal. There is no distension.     Palpations: Abdomen is soft.     Tenderness: There is no abdominal tenderness.  Musculoskeletal:     Cervical back: No rigidity.     Right lower leg: No edema.     Left lower leg: No edema.     Right foot: Normal range of motion. No deformity.     Left foot: Normal range of motion. No deformity.  Feet:     Right foot:     Protective Sensation: 7 sites tested.  7 sites sensed.     Skin integrity: Skin integrity normal.     Left foot:     Protective Sensation: 7 sites tested.  7 sites sensed.     Skin integrity: Skin integrity normal.   Lymphadenopathy:     Cervical: No cervical adenopathy.  Skin:    General: Skin is warm and dry.  Neurological:     General: No focal deficit present.     Mental Status: He is alert and oriented to person, place, and time. Mental status is at baseline.  Psychiatric:        Mood and Affect: Mood normal.     Labs reviewed: Basic Metabolic Panel: Recent Labs    05/15/23 0000 09/11/23 0000 12/23/23 1620 01/24/24 1002 04/29/24 0000 04/29/24 1117  NA 133*  --   --  134* 136*  --   K 4.3  --   --  4.4 4.3  --   CL 98*  --   --  99 100  --   CO2 23*  --   --  24* 27*  --   BUN 15  --   --  13 13  --   CREATININE 1.0  --  1.20 1.1 1.1  --   CALCIUM 9.8  --   --  9.4  --  9.7  TSH  --  2.62  --   --   --   --    Liver Function Tests: Recent Labs    01/24/24 1002  AST 25  ALT 32   No results for input(s): LIPASE, AMYLASE in the last 8760 hours. No results for input(s): AMMONIA in the last 8760 hours. CBC: Recent Labs    01/24/24 1002  WBC 5.1  HGB 13.8  HCT 40*  PLT 195   Lipid Panel: Recent Labs    09/11/23 0000  CHOL 127  HDL 40  LDLCALC 59  TRIG 139   Lab Results  Component Value Date   HGBA1C 7.2 04/29/2024    Procedures since last visit: No results found.  Assessment/Plan Assessment and Plan Assessment & Plan Type 2 diabetes mellitus Managed with  Mounjaro . A1c increased to 7.2 from 6.4, likely due to initial lower dose.  - Continue Mounjaro  at current dosage. - Follow up with endocrinologist in two weeks. - Monitor A1c levels with endocrinologist.  Hypertension Managed with losartan . Blood pressure readings variable, some in 150s. Losartan  provides renal protection. - Continue losartan  at current dosage. - Ensure consistent blood pressure measurement technique. - Monitor blood pressure readings, especially if consistently in the 150s.  Constipation Previously exacerbated by Ozempic . Managed with Senokot and Miralax . - Continue  Senokot and Miralax  as needed.  Vitamin D  deficiency Previously treated with high-dose vitamin D . Current level adequate at 61. - Reduce vitamin D  supplementation to 1000 units daily. - Add vitamin D  to medication list for over-the-counter purchase.  General Health Maintenance Routine health maintenance discussed, including foot care and cholesterol monitoring. - Checked feet for sensation and abnormalities. - Check cholesterol levels at next visit. - Recheck vitamin D  and kidney function at next visit.    F/U 6 months Check CBC BMP Lipid Vit D prior to apt A1c done at endocrinology     Labs/tests ordered:  * No order type specified *Lipid BMP A1C prior to next apt Next appt:  Visit date not found   Total time **:  time greater than 50% of total time spent doing pt counseling and coordination of care

## 2024-05-07 ENCOUNTER — Other Ambulatory Visit (HOSPITAL_BASED_OUTPATIENT_CLINIC_OR_DEPARTMENT_OTHER): Payer: Self-pay

## 2024-05-08 ENCOUNTER — Other Ambulatory Visit (HOSPITAL_BASED_OUTPATIENT_CLINIC_OR_DEPARTMENT_OTHER): Payer: Self-pay

## 2024-05-13 ENCOUNTER — Other Ambulatory Visit (HOSPITAL_BASED_OUTPATIENT_CLINIC_OR_DEPARTMENT_OTHER): Payer: Self-pay

## 2024-05-13 DIAGNOSIS — I10 Essential (primary) hypertension: Secondary | ICD-10-CM | POA: Diagnosis not present

## 2024-05-13 DIAGNOSIS — E1165 Type 2 diabetes mellitus with hyperglycemia: Secondary | ICD-10-CM | POA: Diagnosis not present

## 2024-05-13 DIAGNOSIS — E785 Hyperlipidemia, unspecified: Secondary | ICD-10-CM | POA: Diagnosis not present

## 2024-05-13 MED ORDER — METFORMIN HCL ER 500 MG PO TB24
1000.0000 mg | ORAL_TABLET | Freq: Two times a day (BID) | ORAL | 3 refills | Status: AC
  Filled 2024-05-21 – 2024-06-19 (×2): qty 360, 90d supply, fill #0

## 2024-05-13 MED ORDER — MOUNJARO 5 MG/0.5ML ~~LOC~~ SOAJ
5.0000 mg | SUBCUTANEOUS | 11 refills | Status: AC
  Filled 2024-05-21: qty 2, 28d supply, fill #0

## 2024-05-21 ENCOUNTER — Other Ambulatory Visit (HOSPITAL_BASED_OUTPATIENT_CLINIC_OR_DEPARTMENT_OTHER): Payer: Self-pay

## 2024-05-21 ENCOUNTER — Other Ambulatory Visit: Payer: Self-pay

## 2024-05-21 MED FILL — Simvastatin Tab 40 MG: ORAL | 90 days supply | Qty: 90 | Fill #1 | Status: AC

## 2024-06-19 ENCOUNTER — Other Ambulatory Visit (HOSPITAL_BASED_OUTPATIENT_CLINIC_OR_DEPARTMENT_OTHER): Payer: Self-pay

## 2024-06-21 ENCOUNTER — Other Ambulatory Visit (HOSPITAL_BASED_OUTPATIENT_CLINIC_OR_DEPARTMENT_OTHER): Payer: Self-pay

## 2024-11-04 ENCOUNTER — Encounter: Admitting: Internal Medicine
